# Patient Record
Sex: Female | Born: 1980 | Race: White | Hispanic: No | Marital: Married | Smoking: Never smoker
Health system: Southern US, Community
[De-identification: ages and names within clinical notes are randomized; demographics above are authoritative.]

## PROBLEM LIST (undated history)

## (undated) DIAGNOSIS — G90A Postural orthostatic tachycardia syndrome (POTS): Secondary | ICD-10-CM

## (undated) DIAGNOSIS — D509 Iron deficiency anemia, unspecified: Secondary | ICD-10-CM

## (undated) DIAGNOSIS — D62 Acute posthemorrhagic anemia: Secondary | ICD-10-CM

## (undated) DIAGNOSIS — K900 Celiac disease: Secondary | ICD-10-CM

## (undated) DIAGNOSIS — A389 Scarlet fever, uncomplicated: Secondary | ICD-10-CM

## (undated) DIAGNOSIS — F988 Other specified behavioral and emotional disorders with onset usually occurring in childhood and adolescence: Secondary | ICD-10-CM

## (undated) DIAGNOSIS — I341 Nonrheumatic mitral (valve) prolapse: Secondary | ICD-10-CM

## (undated) DIAGNOSIS — G4733 Obstructive sleep apnea (adult) (pediatric): Secondary | ICD-10-CM

## (undated) DIAGNOSIS — B977 Papillomavirus as the cause of diseases classified elsewhere: Secondary | ICD-10-CM

## (undated) DIAGNOSIS — O30043 Twin pregnancy, dichorionic/diamniotic, third trimester: Secondary | ICD-10-CM

## (undated) DIAGNOSIS — O4703 False labor before 37 completed weeks of gestation, third trimester: Secondary | ICD-10-CM

## (undated) DIAGNOSIS — J45909 Unspecified asthma, uncomplicated: Secondary | ICD-10-CM

## (undated) DIAGNOSIS — K219 Gastro-esophageal reflux disease without esophagitis: Secondary | ICD-10-CM

## (undated) DIAGNOSIS — G43909 Migraine, unspecified, not intractable, without status migrainosus: Secondary | ICD-10-CM

## (undated) HISTORY — DX: Obstructive sleep apnea (adult) (pediatric): G47.33

## (undated) HISTORY — PX: CRYOABLATION: SHX1415

## (undated) HISTORY — DX: Other specified behavioral and emotional disorders with onset usually occurring in childhood and adolescence: F98.8

## (undated) HISTORY — DX: Postural orthostatic tachycardia syndrome (POTS): G90.A

## (undated) HISTORY — DX: Migraine, unspecified, not intractable, without status migrainosus: G43.909

## (undated) HISTORY — DX: Nonrheumatic mitral (valve) prolapse: I34.1

## (undated) HISTORY — PX: COLONOSCOPY: SHX174

## (undated) HISTORY — PX: WISDOM TOOTH EXTRACTION: SHX21

## (undated) HISTORY — PX: TYMPANOSTOMY TUBE PLACEMENT: SHX32

## (undated) HISTORY — PX: UPPER GASTROINTESTINAL ENDOSCOPY: SHX188

## (undated) HISTORY — PX: OTHER SURGICAL HISTORY: SHX169

---

## 2002-04-23 HISTORY — PX: LAPAROSCOPIC NISSEN FUNDOPLICATION: SHX1932

## 2012-03-21 LAB — OB RESULTS CONSOLE HIV ANTIBODY (ROUTINE TESTING): HIV: NONREACTIVE

## 2012-03-21 LAB — OB RESULTS CONSOLE ABO/RH: RH Type: POSITIVE

## 2012-03-21 LAB — OB RESULTS CONSOLE RUBELLA ANTIBODY, IGM: Rubella: IMMUNE

## 2012-04-23 NOTE — L&D Delivery Note (Signed)
Operative Delivery Note At 9:09 PM a viable and healthy female was delivered via Vaginal, Vacuum Investment banker, operational).  Presentation: vertex; Position: Left,, Occiput,, Anterior; Station: +3.  Verbal consent: obtained from patient.  Risks and benefits discussed in detail.  Risks include, but are not limited to the risks of anesthesia, bleeding, infection, damage to maternal tissues, fetal cephalhematoma.  There is also the risk of inability to effect vaginal delivery of the head, or shoulder dystocia that cannot be resolved by established maneuvers, leading to the need for emergency cesarean section.  APGAR: 8, 9; weight .   Placenta status: Intact, Spontaneous.   Cord: 3 vessels with the following complications: None.  Cord pH: na  Anesthesia: Epidural  Instruments: Kiwi x 2 pulls and no pop offs Episiotomy: Median Lacerations: None Suture Repair: 2.0 3.0 vicryl rapide Est. Blood Loss (mL): 200  Mom to postpartum.  Baby to nursery-stable.  Anissa Abbs J 10/02/2012, 10:12 PM

## 2012-10-02 ENCOUNTER — Inpatient Hospital Stay (HOSPITAL_COMMUNITY)
Admission: AD | Admit: 2012-10-02 | Discharge: 2012-10-04 | DRG: 775 | Disposition: A | Discharge status: Home / Self Care | Payer: Managed Care, Other (non HMO) | Source: Ambulatory Visit | Attending: Obstetrics and Gynecology | Admitting: Obstetrics and Gynecology

## 2012-10-02 ENCOUNTER — Encounter (HOSPITAL_COMMUNITY): Payer: Self-pay | Admitting: Anesthesiology

## 2012-10-02 ENCOUNTER — Inpatient Hospital Stay (HOSPITAL_COMMUNITY): Payer: Managed Care, Other (non HMO) | Admitting: Anesthesiology

## 2012-10-02 ENCOUNTER — Encounter (HOSPITAL_COMMUNITY): Payer: Self-pay | Admitting: *Deleted

## 2012-10-02 DIAGNOSIS — D62 Acute posthemorrhagic anemia: Secondary | ICD-10-CM | POA: Diagnosis not present

## 2012-10-02 DIAGNOSIS — O3660X0 Maternal care for excessive fetal growth, unspecified trimester, not applicable or unspecified: Secondary | ICD-10-CM | POA: Diagnosis present

## 2012-10-02 DIAGNOSIS — O9903 Anemia complicating the puerperium: Secondary | ICD-10-CM | POA: Diagnosis not present

## 2012-10-02 HISTORY — DX: Unspecified asthma, uncomplicated: J45.909

## 2012-10-02 HISTORY — DX: Celiac disease: K90.0

## 2012-10-02 HISTORY — DX: Gastro-esophageal reflux disease without esophagitis: K21.9

## 2012-10-02 HISTORY — DX: Scarlet fever, uncomplicated: A38.9

## 2012-10-02 HISTORY — DX: Papillomavirus as the cause of diseases classified elsewhere: B97.7

## 2012-10-02 LAB — CBC
MCH: 32.7 pg (ref 26.0–34.0)
MCHC: 33.6 g/dL (ref 30.0–36.0)
Platelets: 223 10*3/uL (ref 150–400)
RDW: 13.4 % (ref 11.5–15.5)

## 2012-10-02 LAB — RPR: RPR Ser Ql: NONREACTIVE

## 2012-10-02 MED ORDER — LACTATED RINGERS IV SOLN
500.0000 mL | Freq: Once | INTRAVENOUS | Status: AC
  Administered 2012-10-02: 1000 mL via INTRAVENOUS

## 2012-10-02 MED ORDER — LIDOCAINE HCL (PF) 1 % IJ SOLN
INTRAMUSCULAR | Status: DC | PRN
  Administered 2012-10-02 (×2): 5 mL

## 2012-10-02 MED ORDER — IBUPROFEN 600 MG PO TABS: 600.0000 mg | ORAL_TABLET | Freq: Four times a day (QID) | ORAL | Status: DC | PRN

## 2012-10-02 MED ORDER — PHENYLEPHRINE 40 MCG/ML (10ML) SYRINGE FOR IV PUSH (FOR BLOOD PRESSURE SUPPORT)
80.0000 ug | PREFILLED_SYRINGE | INTRAVENOUS | Status: DC | PRN
  Administered 2012-10-02: 80 ug via INTRAVENOUS
  Filled 2012-10-02: qty 2

## 2012-10-02 MED ORDER — METHYLERGONOVINE MALEATE 0.2 MG/ML IJ SOLN
0.2000 mg | INTRAMUSCULAR | Status: DC | PRN
Start: 2012-10-02 — End: 2012-10-03

## 2012-10-02 MED ORDER — ACETAMINOPHEN 325 MG PO TABS: 650.0000 mg | ORAL_TABLET | ORAL | Status: DC | PRN

## 2012-10-02 MED ORDER — ONDANSETRON HCL 4 MG/2ML IJ SOLN: 4.0000 mg | INTRAMUSCULAR | Status: DC | PRN

## 2012-10-02 MED ORDER — FENTANYL 2.5 MCG/ML BUPIVACAINE 1/10 % EPIDURAL INFUSION (WH - ANES)
14.0000 mL/h | INTRAMUSCULAR | Status: DC | PRN
  Administered 2012-10-02 (×2): 14 mL/h via EPIDURAL
  Filled 2012-10-02 (×2): qty 125

## 2012-10-02 MED ORDER — ZOLPIDEM TARTRATE 5 MG PO TABS: 5.0000 mg | ORAL_TABLET | Freq: Every evening | ORAL | Status: DC | PRN

## 2012-10-02 MED ORDER — DIPHENHYDRAMINE HCL 50 MG/ML IJ SOLN: 12.5000 mg | INTRAMUSCULAR | Status: DC | PRN

## 2012-10-02 MED ORDER — ONDANSETRON HCL 4 MG/2ML IJ SOLN: 4.0000 mg | Freq: Four times a day (QID) | INTRAMUSCULAR | Status: DC | PRN

## 2012-10-02 MED ORDER — PRENATAL MULTIVITAMIN CH
1.0000 | ORAL_TABLET | Freq: Every day | ORAL | Status: DC
  Administered 2012-10-03 – 2012-10-04 (×2): 1 via ORAL
  Filled 2012-10-02 (×2): qty 1

## 2012-10-02 MED ORDER — SENNOSIDES-DOCUSATE SODIUM 8.6-50 MG PO TABS
2.0000 | ORAL_TABLET | Freq: Every day | ORAL | Status: DC
  Administered 2012-10-03: 2 via ORAL

## 2012-10-02 MED ORDER — DIBUCAINE 1 % RE OINT: 1.0000 "application " | TOPICAL_OINTMENT | RECTAL | Status: DC | PRN

## 2012-10-02 MED ORDER — EPHEDRINE 5 MG/ML INJ
10.0000 mg | INTRAVENOUS | Status: DC | PRN
  Filled 2012-10-02: qty 2

## 2012-10-02 MED ORDER — FLEET ENEMA 7-19 GM/118ML RE ENEM: 1.0000 | ENEMA | RECTAL | Status: DC | PRN

## 2012-10-02 MED ORDER — LACTATED RINGERS IV SOLN: 500.0000 mL | INTRAVENOUS | Status: DC | PRN

## 2012-10-02 MED ORDER — SIMETHICONE 80 MG PO CHEW: 80.0000 mg | CHEWABLE_TABLET | ORAL | Status: DC | PRN

## 2012-10-02 MED ORDER — TERBUTALINE SULFATE 1 MG/ML IJ SOLN: 0.2500 mg | Freq: Once | INTRAMUSCULAR | Status: DC | PRN

## 2012-10-02 MED ORDER — METHYLERGONOVINE MALEATE 0.2 MG PO TABS: 0.2000 mg | ORAL_TABLET | ORAL | Status: DC | PRN

## 2012-10-02 MED ORDER — OXYTOCIN 40 UNITS IN LACTATED RINGERS INFUSION - SIMPLE MED
62.5000 mL/h | INTRAVENOUS | Status: DC
  Filled 2012-10-02: qty 1000

## 2012-10-02 MED ORDER — OXYCODONE-ACETAMINOPHEN 5-325 MG PO TABS: 1.0000 | ORAL_TABLET | ORAL | Status: DC | PRN

## 2012-10-02 MED ORDER — ONDANSETRON HCL 4 MG PO TABS: 4.0000 mg | ORAL_TABLET | ORAL | Status: DC | PRN

## 2012-10-02 MED ORDER — CITRIC ACID-SODIUM CITRATE 334-500 MG/5ML PO SOLN: 30.0000 mL | ORAL | Status: DC | PRN

## 2012-10-02 MED ORDER — DIPHENHYDRAMINE HCL 25 MG PO CAPS: 25.0000 mg | ORAL_CAPSULE | Freq: Four times a day (QID) | ORAL | Status: DC | PRN

## 2012-10-02 MED ORDER — LACTATED RINGERS IV SOLN
INTRAVENOUS | Status: DC
  Administered 2012-10-02 (×3): via INTRAVENOUS

## 2012-10-02 MED ORDER — OXYTOCIN BOLUS FROM INFUSION: 500.0000 mL | INTRAVENOUS | Status: DC

## 2012-10-02 MED ORDER — WITCH HAZEL-GLYCERIN EX PADS
1.0000 "application " | MEDICATED_PAD | CUTANEOUS | Status: DC | PRN
  Administered 2012-10-03 (×2): 1 via TOPICAL

## 2012-10-02 MED ORDER — IBUPROFEN 600 MG PO TABS
600.0000 mg | ORAL_TABLET | Freq: Four times a day (QID) | ORAL | Status: DC
  Administered 2012-10-03 – 2012-10-04 (×7): 600 mg via ORAL
  Filled 2012-10-02 (×7): qty 1

## 2012-10-02 MED ORDER — OXYTOCIN 40 UNITS IN LACTATED RINGERS INFUSION - SIMPLE MED
1.0000 m[IU]/min | INTRAVENOUS | Status: DC
  Administered 2012-10-02: 2 m[IU]/min via INTRAVENOUS

## 2012-10-02 MED ORDER — TETANUS-DIPHTH-ACELL PERTUSSIS 5-2.5-18.5 LF-MCG/0.5 IM SUSP: 0.5000 mL | Freq: Once | INTRAMUSCULAR | Status: DC

## 2012-10-02 MED ORDER — BENZOCAINE-MENTHOL 20-0.5 % EX AERO
1.0000 "application " | INHALATION_SPRAY | CUTANEOUS | Status: DC | PRN
  Administered 2012-10-03 – 2012-10-04 (×4): 1 via TOPICAL
  Filled 2012-10-02 (×4): qty 56

## 2012-10-02 MED ORDER — LIDOCAINE HCL (PF) 1 % IJ SOLN
30.0000 mL | INTRAMUSCULAR | Status: DC | PRN
  Filled 2012-10-02 (×2): qty 30

## 2012-10-02 MED ORDER — LANOLIN HYDROUS EX OINT: TOPICAL_OINTMENT | CUTANEOUS | Status: DC | PRN

## 2012-10-02 MED ORDER — EPHEDRINE 5 MG/ML INJ
10.0000 mg | INTRAVENOUS | Status: DC | PRN
  Filled 2012-10-02: qty 4
  Filled 2012-10-02: qty 2

## 2012-10-02 MED ORDER — PHENYLEPHRINE 40 MCG/ML (10ML) SYRINGE FOR IV PUSH (FOR BLOOD PRESSURE SUPPORT)
80.0000 ug | PREFILLED_SYRINGE | INTRAVENOUS | Status: DC | PRN
  Filled 2012-10-02: qty 2
  Filled 2012-10-02: qty 5

## 2012-10-02 MED ORDER — BUTORPHANOL TARTRATE 1 MG/ML IJ SOLN: 1.0000 mg | INTRAMUSCULAR | Status: DC | PRN

## 2012-10-02 NOTE — Progress Notes (Signed)
SVE performed on pt at 0630 and md notified of results. Md requests to start pitocin at this time.  Pt informed of this and of infection risks if labor does not begin within a certain time of ruptured membranes.  Pt verifies that she understand this but still does not want to start pitocin.  Says that she wants to walk for and then reassess.  Dr Billy Coast says to report to patient that he does not agree with this plan but if pt insists on walking, she can be taken off fhr monitors for the .  Md wants pt started on pitocin immediately after pt returns from walking.  Patient informed of this, took off monitors, and sent to walk.

## 2012-10-02 NOTE — MAU Note (Signed)
Pt to room 170 for eval of ROM

## 2012-10-02 NOTE — Progress Notes (Signed)
Cheryl Lara is a 32 y.o. G1P0 at [redacted]w[redacted]d by LMP admitted for active labor, rupture of membranes  Subjective: Occ pressure  Objective: BP 122/71  Pulse 99  Temp(Src) 99.2 F (37.3 C) (Oral)  Resp 18  Ht 5\' 9"  (1.753 m)  Wt 112.038 kg (247 lb)  BMI 36.46 kg/m2  SpO2 97%      FHT:  FHR: 155 bpm, variability: moderate,  accelerations:  Present,  decelerations:  Absent UC:   regular, every 2 minutes SVE:   FD/100/+1 LOT-LOA Labs: Lab Results  Component Value Date   WBC 13.3* 10/02/2012   HGB 11.6* 10/02/2012   HCT 34.5* 10/02/2012   MCV 97.2 10/02/2012   PLT 223 10/02/2012    Assessment / Plan: Augmentation of labor, progressing well Likely LGA  Labor: Progressing normally Preeclampsia:  labs stable Fetal Wellbeing:  Category I Pain Control:  Epidural I/D:  n/a Anticipated MOD:  NSVD  Sache Sane J 10/02/2012, 6:07 PM

## 2012-10-02 NOTE — Progress Notes (Signed)
Cheryl Lara is a 32 y.o. G1P0 at [redacted]w[redacted]d by LMP admitted for rupture of membranes  Subjective: Mild contractions  Objective: BP 143/66  Pulse 89  Temp(Src) 98.7 F (37.1 C) (Oral)  Resp 20  Ht 5\' 9"  (1.753 m)  Wt 112.038 kg (247 lb)  BMI 36.46 kg/m2  SpO2 100%      FHT:  FHR: 125 bpm, variability: moderate,  accelerations:  Present,  decelerations:  Absent UC:   irregular, every 8 minutes SVE:   Dilation: 1.5 Effacement (%): 70 Station: -2 Exam by:: e.foley,rn  Labs: Lab Results  Component Value Date   WBC 13.3* 10/02/2012   HGB 11.6* 10/02/2012   HCT 34.5* 10/02/2012   MCV 97.2 10/02/2012   PLT 223 10/02/2012    Assessment / Plan: Protracted latent phase  Labor: pitocin Preeclampsia:  labs stable Fetal Wellbeing:  Category I Pain Control:  Labor support without medications I/D:  n/a Anticipated MOD:  NSVD  Cheryl Lara J 10/02/2012, 6:33 AM

## 2012-10-02 NOTE — Progress Notes (Signed)
BOBIE CARIS is a 32 y.o. G1P0 at [redacted]w[redacted]d by LMP admitted for rupture of membranes  Subjective: Inc discomfort  Objective: BP 138/64  Pulse 85  Temp(Src) 98.2 F (36.8 C) (Oral)  Resp 20  Ht 5\' 9"  (1.753 m)  Wt 112.038 kg (247 lb)  BMI 36.46 kg/m2  SpO2 100%      FHT:  FHR: 135 bpm, variability: moderate,  accelerations:  Present,  decelerations:  Absent UC:   irregular, every 4 minutes SVE:   Dilation: 4 Effacement (%): 90 Station: -2 Exam by:: Dr. Billy Coast  Labs: Lab Results  Component Value Date   WBC 13.3* 10/02/2012   HGB 11.6* 10/02/2012   HCT 34.5* 10/02/2012   MCV 97.2 10/02/2012   PLT 223 10/02/2012    Assessment / Plan: Augmentation of labor, progressing well  Labor: Progressing normally Preeclampsia:  labs stable Fetal Wellbeing:  Category I Pain Control:  Labor support without medications I/D:  n/a Anticipated MOD:  NSVD  Jasim Harari J 10/02/2012, 8:35 AM

## 2012-10-02 NOTE — Anesthesia Procedure Notes (Signed)
Epidural Patient location during procedure: OB Start time: 10/02/2012 9:41 AM  Staffing Anesthesiologist: Angus Seller., Harrell Gave. Performed by: anesthesiologist   Preanesthetic Checklist Completed: patient identified, site marked, surgical consent, pre-op evaluation, timeout performed, IV checked, risks and benefits discussed and monitors and equipment checked  Epidural Patient position: sitting Prep: site prepped and draped and DuraPrep Patient monitoring: continuous pulse ox and blood pressure Approach: midline Injection technique: LOR air and LOR saline  Needle:  Needle type: Tuohy  Needle gauge: 17 G Needle length: 9 cm and 9 Needle insertion depth: 6 cm Catheter type: closed end flexible Catheter size: 19 Gauge Catheter at skin depth: 12 cm Test dose: negative  Assessment Events: blood not aspirated, injection not painful, no injection resistance, negative IV test and no paresthesia  Additional Notes Patient identified.  Risk benefits discussed including failed block, incomplete pain control, headache, nerve damage, paralysis, blood pressure changes, nausea, vomiting, reactions to medication both toxic or allergic, and postpartum back pain.  Patient expressed understanding and wished to proceed.  All questions were answered.  Sterile technique used throughout procedure and epidural site dressed with sterile barrier dressing. No paresthesia or other complications noted.The patient did not experience any signs of intravascular injection such as tinnitus or metallic taste in mouth nor signs of intrathecal spread such as rapid motor block. Please see nursing notes for vital signs.

## 2012-10-02 NOTE — H&P (Signed)
Cheryl Lara, Cheryl Lara NO.:  [id number]  MEDICAL RECORD NO.:  [id number]  LOCATION:  9170                          FACILITY:  [facility]  PHYSICIAN:  Lenoard Aden, M.D.DATE OF BIRTH:  05-Mar-1981  DATE OF ADMISSION:  10/02/2012 DATE OF DISCHARGE:                             HISTORY & PHYSICAL   CHIEF COMPLAINT:  Spontaneous ruptured membranes at approximately 10:30, on June 11,2014.  HISTORY OF PRESENT ILLNESS:  She is a 32 year old white female, G1, P0, at 73 and 5/7th weeks gestation with spontaneous rupture of membranes and favorable cervix at this time.  PAST MEDICAL HISTORY:  Remarkable for asthma, scarlet fever, and cryosurgery for an abnormal Pap smear in 2008.  She is a nonsmoker, nondrinker.  She denies domestic or physical violence.  Her medical history is remarkable for gastroesophageal reflux.  MEDICATIONS:  Prenatal vitamins.  FAMILY HISTORY:  She has a family history of heart disease, hypertension, lymphoma, and breast cancer.  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  PRENATAL COURSE:  Complicated by excessive maternal weight gain and presumed delayed for gestational age fetus.  PHYSICAL EXAMINATION:  GENERAL:  She is a well-developed, well- nourished, white female, in no acute distress. HEENT:  Normal. NECK:  Supple.  Full range of motion. LUNGS:  Clear. HEART:  Regular rate and rhythm. ABDOMEN:  Soft, gravid, nontender.  Estimated fetal weight 8-1/2 to 9 pounds. Cervix 3 to 4 cm, 90%, vertex, -1. EXTREMITIES:  There are no cords. NEUROLOGIC:  Nonfocal. SKIN:  Intact.  IMPRESSION:  Term intrauterine pregnancy with spontaneous rupture of membranes.  PLAN:  Admit, Pitocin augmentation, epidural as needed.  Anticipate attempts at vaginal delivery.  Monitor signs and symptoms of infection.  __________     Lenoard Aden, M.D.     RJT/MEDQ  D:  10/02/2012  T:  10/02/2012  Job:  [id number]

## 2012-10-02 NOTE — Anesthesia Preprocedure Evaluation (Signed)
Anesthesia Evaluation  Patient identified by MRN, date of birth, ID band Patient awake    Reviewed: Allergy & Precautions, H&P , Patient's Chart, lab work & pertinent test results  Airway Mallampati: II TM Distance: >3 FB Neck ROM: full    Dental no notable dental hx.    Pulmonary neg pulmonary ROS, asthma ,  breath sounds clear to auscultation  Pulmonary exam normal       Cardiovascular negative cardio ROS  Rhythm:regular Rate:Normal     Neuro/Psych negative neurological ROS  negative psych ROS   GI/Hepatic negative GI ROS, Neg liver ROS, GERD-  ,  Endo/Other  negative endocrine ROS  Renal/GU negative Renal ROS     Musculoskeletal   Abdominal   Peds  Hematology negative hematology ROS (+)   Anesthesia Other Findings Asthma   exercise induced GERD (gastroesophageal reflux disease)        HPV (human papilloma virus) infection     Scarlet fever   hx of    Celiac disease    Reproductive/Obstetrics (+) Pregnancy                           Anesthesia Physical Anesthesia Plan  ASA: II  Anesthesia Plan: Epidural   Post-op Pain Management:    Induction:   Airway Management Planned:   Additional Equipment:   Intra-op Plan:   Post-operative Plan:   Informed Consent: I have reviewed the patients History and Physical, chart, labs and discussed the procedure including the risks, benefits and alternatives for the proposed anesthesia with the patient or authorized representative who has indicated his/her understanding and acceptance.     Plan Discussed with:   Anesthesia Plan Comments:         Anesthesia Quick Evaluation

## 2012-10-02 NOTE — MAU Note (Signed)
Pt reports ROM at 2227.contractions

## 2012-10-02 NOTE — Progress Notes (Signed)
KIMESHA CLAXTON is a 32 y.o. G1P0 at [redacted]w[redacted]d by LMP admitted for active labor, rupture of membranes  Subjective:   Objective: BP 108/67  Pulse 81  Temp(Src) 98.9 F (37.2 C) (Oral)  Resp 20  Ht 5\' 9"  (1.753 m)  Wt 112.038 kg (247 lb)  BMI 36.46 kg/m2  SpO2 98%      FHT:  FHR: 155 bpm, variability: moderate,  accelerations:  Present,  decelerations:  Absent UC:   regular, every 2 minutes SVE:   8/100/0 IUPC placed without difficulty  Labs: Lab Results  Component Value Date   WBC 13.3* 10/02/2012   HGB 11.6* 10/02/2012   HCT 34.5* 10/02/2012   MCV 97.2 10/02/2012   PLT 223 10/02/2012    Assessment / Plan: Augmentation of labor s/p SROM Likely LGA NO s/s chorio  Labor: Progressing normally Preeclampsia:  labs stable Fetal Wellbeing:  Category I Pain Control:  Epidural I/D:  n/a Anticipated MOD:  NSVD  Meerab Maselli J 10/02/2012, 1:38 PM

## 2012-10-03 ENCOUNTER — Encounter (HOSPITAL_COMMUNITY): Payer: Self-pay | Admitting: *Deleted

## 2012-10-03 LAB — CBC
Hemoglobin: 9.4 g/dL — ABNORMAL LOW (ref 12.0–15.0)
MCH: 32.1 pg (ref 26.0–34.0)
MCV: 95.9 fL (ref 78.0–100.0)
RBC: 2.93 MIL/uL — ABNORMAL LOW (ref 3.87–5.11)
WBC: 16.8 10*3/uL — ABNORMAL HIGH (ref 4.0–10.5)

## 2012-10-03 MED ORDER — PNEUMOCOCCAL VAC POLYVALENT 25 MCG/0.5ML IJ INJ
0.5000 mL | INJECTION | INTRAMUSCULAR | Status: AC
  Administered 2012-10-04: 0.5 mL via INTRAMUSCULAR
  Filled 2012-10-03: qty 0.5

## 2012-10-03 NOTE — Anesthesia Postprocedure Evaluation (Signed)
  Anesthesia Post-op Note  Patient: Cheryl Lara  Procedure(s) Performed: * No procedures listed *  Patient Location: Mother/Baby  Anesthesia Type:Epidural  Level of Consciousness: awake  Airway and Oxygen Therapy: Patient Spontanous Breathing  Post-op Pain: none  Post-op Assessment: Patient's Cardiovascular Status Stable, Respiratory Function Stable, Patent Airway, No signs of Nausea or vomiting, Adequate PO intake, Pain level controlled, No headache, No backache, No residual numbness and No residual motor weakness  Post-op Vital Signs: Reviewed and stable  Complications: No apparent anesthesia complications

## 2012-10-03 NOTE — Progress Notes (Signed)
PPD 1 SVD  S:  Reports feeling tired and sore             Tolerating po/ No nausea or vomiting             Bleeding is light             Pain controlled with motrin and percocet             Up ad lib / ambulatory / voiding QS  Newborn breast feeding  / Circumcision planned O:               VS: BP 125/73  Pulse 103  Temp(Src) 98.4 F (36.9 C) (Oral)  Resp 18  Ht 5\' 9"  (1.753 m)  Wt 112.038 kg (247 lb)  BMI 36.46 kg/m2  SpO2 97%   LABS:  Recent Labs  10/02/12 0100 10/03/12 0643  WBC 13.3* 16.8*  HGB 11.6* 9.4*  PLT 223 159                                         I&O: Intake/Output     06/12 0701 - 06/13 0700 06/13 0701 - 06/14 0700   Urine (mL/kg/hr) 850 (0.3)    Blood 200 (0.1)    Total Output 1050     Net -1050          Urine Occurrence 1 x                  Physical Exam:             Alert and oriented X3  Lungs: Clear and unlabored  Heart: regular rate and rhythm / no mumurs  Abdomen: soft, non-tender, non-distended              Fundus: firm, non-tender, Ueven  Perineum: no edema  Lochia: light  Extremities: trace edema, no calf pain or tenderness    A: PPD # 1   Doing well - stable status  P:  Routine post partum orders    Marlinda Mike CNM, MSN, [facility] 10/03/2012, 9:36 AM

## 2012-10-04 DIAGNOSIS — D62 Acute posthemorrhagic anemia: Secondary | ICD-10-CM

## 2012-10-04 HISTORY — DX: Acute posthemorrhagic anemia: D62

## 2012-10-04 MED ORDER — OXYCODONE-ACETAMINOPHEN 5-325 MG PO TABS: 1.0000 | ORAL_TABLET | ORAL | Status: DC | PRN

## 2012-10-04 MED ORDER — IBUPROFEN 600 MG PO TABS: 600.0000 mg | ORAL_TABLET | Freq: Four times a day (QID) | ORAL | Status: DC

## 2012-10-04 NOTE — Discharge Summary (Signed)
Obstetric Discharge Summary  Reason for Admission: onset of labor Prenatal Procedures: none Intrapartum Procedures: vacuum and episiotomy median Postpartum Procedures: none Complications-Operative and Postpartum: 2nd degree median episiotomy without extenstion Hemoglobin  Date Value Range Status  10/03/2012 9.4* 12.0 - 15.0 g/dL Final     DELTA CHECK NOTED     REPEATED TO VERIFY     HCT  Date Value Range Status  10/03/2012 28.1* 36.0 - 46.0 % Final    Physical Exam:  General: alert, cooperative, fatigued and no distress Lochia: appropriate Uterine Fundus: firm Incision: healing well DVT Evaluation: No evidence of DVT seen on physical exam.  Discharge Diagnoses: Term Pregnancy-delivered and Mild ABL anemia / VAC assisted delivery with median episiotomy  Discharge Information: Date: 10/04/2012 Activity: pelvic rest Diet: routine Medications: PNV, Ibuprofen, Colace, Iron and Percocet (OTC slo-Fe / MOM if any constipation) Condition: stable Instructions: refer to practice specific booklet Discharge to: home Follow-up Information   Follow up with Lenoard Aden, MD. Schedule an appointment as soon as possible for a visit in 6 weeks.   Contact information:   [address] [place] [address] [phone]       Newborn Data: Live born female  Birth Weight: 8 lb 9.9 oz (3910 g) APGAR: 8, 9  Home with mother.  Marlinda Mike 10/04/2012, 9:20 AM

## 2012-10-04 NOTE — Progress Notes (Signed)
PPD 2 SVD  S:  Reports feeling well - better today             Tolerating po/ No nausea or vomiting             Bleeding is light             Pain controlled with motrin and percocet             Up ad lib / ambulatory / voiding QS  Newborn breast feeding  / Circumcision done O:               VS: BP 110/73  Pulse 80  Temp(Src) 98.1 F (36.7 C) (Oral)  Resp 18  Ht 5\' 9"  (1.753 m)  Wt 112.038 kg (247 lb)  BMI 36.46 kg/m2  SpO2 98%   LABS:  Recent Labs  10/02/12 0100 10/03/12 0643  WBC 13.3* 16.8*  HGB 11.6* 9.4*  PLT 223 159                  Physical Exam:             Alert and oriented X3  Lungs: Clear and unlabored  Heart: regular rate and rhythm / no mumurs  Abdomen: soft, non-tender, non-distended              Fundus: firm, non-tender, U-1  Perineum: decreased edema  Lochia: light  Extremities: trace edema, no calf pain or tenderness    A: PPD # 2   Doing well - stable status  P:  Routine post partum orders  DC home - WOB booklet / instructions reviewed  Marlinda Mike CNM, MSN, [facility] 10/04/2012, 9:17 AM

## 2014-01-03 ENCOUNTER — Emergency Department (HOSPITAL_COMMUNITY): Payer: Managed Care, Other (non HMO)

## 2014-01-03 ENCOUNTER — Encounter (HOSPITAL_COMMUNITY): Payer: Self-pay | Admitting: Emergency Medicine

## 2014-01-03 ENCOUNTER — Inpatient Hospital Stay (HOSPITAL_COMMUNITY)
Admission: EM | Admit: 2014-01-03 | Discharge: 2014-01-06 | DRG: 390 | Disposition: A | Discharge status: Home / Self Care | Payer: Managed Care, Other (non HMO) | Attending: Internal Medicine | Admitting: Internal Medicine

## 2014-01-03 DIAGNOSIS — Z79899 Other long term (current) drug therapy: Secondary | ICD-10-CM | POA: Diagnosis not present

## 2014-01-03 DIAGNOSIS — D649 Anemia, unspecified: Secondary | ICD-10-CM

## 2014-01-03 DIAGNOSIS — J45909 Unspecified asthma, uncomplicated: Secondary | ICD-10-CM | POA: Diagnosis present

## 2014-01-03 DIAGNOSIS — K565 Intestinal adhesions [bands], unspecified as to partial versus complete obstruction: Principal | ICD-10-CM | POA: Diagnosis present

## 2014-01-03 DIAGNOSIS — K219 Gastro-esophageal reflux disease without esophagitis: Secondary | ICD-10-CM | POA: Diagnosis present

## 2014-01-03 DIAGNOSIS — K56609 Unspecified intestinal obstruction, unspecified as to partial versus complete obstruction: Secondary | ICD-10-CM | POA: Diagnosis present

## 2014-01-03 DIAGNOSIS — K900 Celiac disease: Secondary | ICD-10-CM | POA: Diagnosis present

## 2014-01-03 DIAGNOSIS — R143 Flatulence: Secondary | ICD-10-CM | POA: Diagnosis not present

## 2014-01-03 DIAGNOSIS — D72829 Elevated white blood cell count, unspecified: Secondary | ICD-10-CM | POA: Diagnosis present

## 2014-01-03 DIAGNOSIS — K56600 Partial intestinal obstruction, unspecified as to cause: Secondary | ICD-10-CM | POA: Diagnosis present

## 2014-01-03 DIAGNOSIS — I498 Other specified cardiac arrhythmias: Secondary | ICD-10-CM | POA: Diagnosis present

## 2014-01-03 DIAGNOSIS — R141 Gas pain: Secondary | ICD-10-CM | POA: Diagnosis present

## 2014-01-03 DIAGNOSIS — R142 Eructation: Secondary | ICD-10-CM | POA: Diagnosis present

## 2014-01-03 LAB — COMPREHENSIVE METABOLIC PANEL
ALBUMIN: 4.6 g/dL (ref 3.5–5.2)
ALT: 17 U/L (ref 0–35)
ANION GAP: 14 (ref 5–15)
AST: 20 U/L (ref 0–37)
Alkaline Phosphatase: 48 U/L (ref 39–117)
BUN: 14 mg/dL (ref 6–23)
CALCIUM: 9.7 mg/dL (ref 8.4–10.5)
CO2: 23 mEq/L (ref 19–32)
CREATININE: 0.69 mg/dL (ref 0.50–1.10)
Chloride: 103 mEq/L (ref 96–112)
GFR calc Af Amer: >90 mL/min (ref >90)
GFR calc non Af Amer: >90 mL/min (ref >90)
Glucose, Bld: 88 mg/dL (ref 70–99)
Potassium: 4.5 mEq/L (ref 3.7–5.3)
Sodium: 140 mEq/L (ref 137–147)
TOTAL PROTEIN: 7.8 g/dL (ref 6.0–8.3)
Total Bilirubin: 1 mg/dL (ref 0.3–1.2)

## 2014-01-03 LAB — CBC WITH DIFFERENTIAL/PLATELET
BASOS ABS: 0 10*3/uL (ref 0.0–0.1)
BASOS PCT: 0 % (ref 0–1)
EOS ABS: 0.1 10*3/uL (ref 0.0–0.7)
Eosinophils Relative: 1 % (ref 0–5)
HEMATOCRIT: 41 % (ref 36.0–46.0)
HEMOGLOBIN: 13.9 g/dL (ref 12.0–15.0)
Lymphocytes Relative: 16 % (ref 12–46)
Lymphs Abs: 1.8 10*3/uL (ref 0.7–4.0)
MCH: 32.7 pg (ref 26.0–34.0)
MCHC: 33.9 g/dL (ref 30.0–36.0)
MCV: 96.5 fL (ref 78.0–100.0)
MONO ABS: 0.9 10*3/uL (ref 0.1–1.0)
MONOS PCT: 8 % (ref 3–12)
Neutro Abs: 8.5 10*3/uL — ABNORMAL HIGH (ref 1.7–7.7)
Neutrophils Relative %: 75 % (ref 43–77)
Platelets: 246 10*3/uL (ref 150–400)
RBC: 4.25 MIL/uL (ref 3.87–5.11)
RDW: 12.9 % (ref 11.5–15.5)
WBC: 11.3 10*3/uL — ABNORMAL HIGH (ref 4.0–10.5)

## 2014-01-03 LAB — URINALYSIS, ROUTINE W REFLEX MICROSCOPIC
BILIRUBIN URINE: NEGATIVE
Glucose, UA: NEGATIVE mg/dL
Hgb urine dipstick: NEGATIVE
Ketones, ur: 15 mg/dL — AB
LEUKOCYTES UA: NEGATIVE
NITRITE: NEGATIVE
Protein, ur: NEGATIVE mg/dL
SPECIFIC GRAVITY, URINE: 1.009 (ref 1.005–1.030)
UROBILINOGEN UA: 0.2 mg/dL (ref 0.0–1.0)
pH: 7 (ref 5.0–8.0)

## 2014-01-03 LAB — PREGNANCY, URINE: PREG TEST UR: NEGATIVE

## 2014-01-03 LAB — LIPASE, BLOOD: LIPASE: 38 U/L (ref 11–59)

## 2014-01-03 MED ORDER — ONDANSETRON HCL 4 MG/2ML IJ SOLN
4.0000 mg | Freq: Four times a day (QID) | INTRAMUSCULAR | Status: DC | PRN
  Administered 2014-01-04 (×2): 4 mg via INTRAVENOUS
  Filled 2014-01-03 (×2): qty 2

## 2014-01-03 MED ORDER — ONDANSETRON HCL 4 MG/2ML IJ SOLN
4.0000 mg | Freq: Once | INTRAMUSCULAR | Status: AC
  Administered 2014-01-03: 4 mg via INTRAVENOUS

## 2014-01-03 MED ORDER — PANTOPRAZOLE SODIUM 40 MG IV SOLR
40.0000 mg | INTRAVENOUS | Status: DC
  Administered 2014-01-04 – 2014-01-05 (×2): 40 mg via INTRAVENOUS
  Filled 2014-01-03 (×2): qty 40

## 2014-01-03 MED ORDER — METOCLOPRAMIDE HCL 5 MG/ML IJ SOLN
10.0000 mg | Freq: Once | INTRAMUSCULAR | Status: AC
  Administered 2014-01-03: 10 mg via INTRAVENOUS
  Filled 2014-01-03: qty 2

## 2014-01-03 MED ORDER — AMPHETAMINE-DEXTROAMPHETAMINE 10 MG PO TABS
15.0000 mg | ORAL_TABLET | Freq: Every day | ORAL | Status: DC
  Administered 2014-01-05 – 2014-01-06 (×2): 15 mg via ORAL
  Filled 2014-01-03 (×3): qty 2

## 2014-01-03 MED ORDER — HYDROMORPHONE HCL PF 1 MG/ML IJ SOLN
1.0000 mg | Freq: Once | INTRAMUSCULAR | Status: AC
  Administered 2014-01-03: 1 mg via INTRAVENOUS
  Filled 2014-01-03: qty 1

## 2014-01-03 MED ORDER — HYDROCODONE-ACETAMINOPHEN 5-325 MG PO TABS: 1.0000 | ORAL_TABLET | ORAL | Status: DC | PRN

## 2014-01-03 MED ORDER — HYDROMORPHONE HCL PF 1 MG/ML IJ SOLN
0.5000 mg | INTRAMUSCULAR | Status: DC | PRN
  Administered 2014-01-04 (×4): 0.5 mg via INTRAVENOUS
  Filled 2014-01-03 (×4): qty 1

## 2014-01-03 MED ORDER — PANTOPRAZOLE SODIUM 40 MG IV SOLR
40.0000 mg | Freq: Once | INTRAVENOUS | Status: AC
  Administered 2014-01-03: 40 mg via INTRAVENOUS
  Filled 2014-01-03: qty 40

## 2014-01-03 MED ORDER — SODIUM CHLORIDE 0.9 % IV BOLUS (SEPSIS)
1000.0000 mL | Freq: Once | INTRAVENOUS | Status: AC
  Administered 2014-01-03: 1000 mL via INTRAVENOUS

## 2014-01-03 MED ORDER — ACETAMINOPHEN 325 MG PO TABS: 650.0000 mg | ORAL_TABLET | Freq: Four times a day (QID) | ORAL | Status: DC | PRN

## 2014-01-03 MED ORDER — ONDANSETRON HCL 4 MG/2ML IJ SOLN
4.0000 mg | Freq: Once | INTRAMUSCULAR | Status: DC
  Filled 2014-01-03: qty 2

## 2014-01-03 MED ORDER — ONDANSETRON HCL 4 MG/2ML IJ SOLN
4.0000 mg | Freq: Once | INTRAMUSCULAR | Status: AC
  Administered 2014-01-03: 4 mg via INTRAVENOUS
  Filled 2014-01-03: qty 2

## 2014-01-03 MED ORDER — ONDANSETRON HCL 4 MG PO TABS: 4.0000 mg | ORAL_TABLET | Freq: Four times a day (QID) | ORAL | Status: DC | PRN

## 2014-01-03 MED ORDER — HYDROMORPHONE HCL PF 1 MG/ML IJ SOLN
0.5000 mg | Freq: Once | INTRAMUSCULAR | Status: AC
Start: 2014-01-03 — End: 2014-01-03
  Administered 2014-01-03: 0.5 mg via INTRAVENOUS
  Filled 2014-01-03: qty 1

## 2014-01-03 MED ORDER — IOHEXOL 300 MG/ML  SOLN
100.0000 mL | Freq: Once | INTRAMUSCULAR | Status: AC | PRN
  Administered 2014-01-03: 100 mL via INTRAVENOUS

## 2014-01-03 MED ORDER — SODIUM CHLORIDE 0.9 % IV SOLN
INTRAVENOUS | Status: DC
  Administered 2014-01-03 – 2014-01-05 (×4): via INTRAVENOUS

## 2014-01-03 MED ORDER — GI COCKTAIL ~~LOC~~
30.0000 mL | Freq: Once | ORAL | Status: AC
  Administered 2014-01-03: 30 mL via ORAL
  Filled 2014-01-03: qty 30

## 2014-01-03 MED ORDER — ENOXAPARIN SODIUM 40 MG/0.4ML ~~LOC~~ SOLN
40.0000 mg | Freq: Every day | SUBCUTANEOUS | Status: DC
  Administered 2014-01-03 – 2014-01-05 (×3): 40 mg via SUBCUTANEOUS
  Filled 2014-01-03 (×4): qty 0.4

## 2014-01-03 MED ORDER — IOHEXOL 300 MG/ML  SOLN
50.0000 mL | Freq: Once | INTRAMUSCULAR | Status: AC | PRN
  Administered 2014-01-03: 50 mL via ORAL

## 2014-01-03 MED ORDER — ACETAMINOPHEN 650 MG RE SUPP: 650.0000 mg | Freq: Four times a day (QID) | RECTAL | Status: DC | PRN

## 2014-01-03 MED ORDER — DIPHENHYDRAMINE HCL 50 MG/ML IJ SOLN
25.0000 mg | Freq: Once | INTRAMUSCULAR | Status: AC
  Administered 2014-01-03: 25 mg via INTRAVENOUS
  Filled 2014-01-03: qty 1

## 2014-01-03 NOTE — ED Provider Notes (Signed)
Assumed care of pt from Dr. Ashok Cordia.  Briefly, pt is a 34 y.o. female presenting with recurrent abd pain since last night.  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  On assumption of care awaiting results of [place] RUQ.   6:35 PM this returned as above without focal abnormality, however pt still in pain and states she does not normally have abd pain, with the last episode being many years ago.  Will obtain CT scan due to ho hiatal hernia and nissen.    CT scan demonstrated partial SBO.  Consulted surgery who will follow along. Consulted medicine for admission.   Debby Freiberg, MD 01/03/14 2225

## 2014-01-03 NOTE — ED Notes (Signed)
md at bedside

## 2014-01-03 NOTE — ED Notes (Signed)
Pt reports severe abdominal pain since last night, sts had few drinks yesterday, also reports nausea. LBM this am.

## 2014-01-03 NOTE — ED Notes (Signed)
Pt alert and oriented x4. Respirations even and unlabored, bilateral symmetrical rise and fall of chest. Skin warm and dry. In no acute distress. Denies needs.   

## 2014-01-03 NOTE — ED Notes (Signed)
Pt nauseated 

## 2014-01-03 NOTE — ED Notes (Signed)
md made aware of pts pain level increasing

## 2014-01-03 NOTE — ED Notes (Signed)
US at bedside

## 2014-01-03 NOTE — H&P (Signed)
PCP: [facility]  Chief Complaint:  Abdominal pain  HPI: Cheryl Lara is a 33 y.o. female   has a past medical history of Asthma; GERD (gastroesophageal reflux disease); HPV (human papilloma virus) infection; Scarlet fever; and Celiac disease.   Presented with  Patient developed abdominal pain and distension since this AM, associated with nausea and vomiting, no fever, no chills. She thought it was due to pancreatitis that she had before. Patient have had abdominal surgeries in the past she have had a Hernia repair and Nissen Placation. In ER she had a CT scan done showing partial SBO. Patient states she is improving but still has some pain and nausea.  Last BM was 9/13 am Hospitalist was called for admission for SBO  Review of Systems:    Pertinent positives include:  abdominal pain, nausea, vomiting,  Constitutional:  No weight loss, night sweats, Fevers, chills, fatigue, weight loss  HEENT:  No headaches, Difficulty swallowing,Tooth/dental problems,Sore throat,  No sneezing, itching, ear ache, nasal congestion, post nasal drip,  Cardio-vascular:  No chest pain, Orthopnea, PND, anasarca, dizziness, palpitations.no Bilateral lower extremity swelling  GI:  No heartburn, indigestion, diarrhea, change in bowel habits, loss of appetite, melena, blood in stool, hematemesis Resp:  no shortness of breath at rest. No dyspnea on exertion, No excess mucus, no productive cough, No non-productive cough, No coughing up of blood.No change in color of mucus.No wheezing. Skin:  no rash or lesions. No jaundice GU:  no dysuria, change in color of urine, no urgency or frequency. No straining to urinate.  No flank pain.  Musculoskeletal:  No joint pain or no joint swelling. No decreased range of motion. No back pain.  Psych:  No change in mood or affect. No depression or anxiety. No memory loss.  Neuro: no localizing neurological complaints, no tingling, no weakness, no  double vision, no gait abnormality, no slurred speech, no confusion  Otherwise ROS are negative except for above, 10 systems were reviewed  Past Medical History: Past Medical History  Diagnosis Date  . Asthma     exercise induced  . GERD (gastroesophageal reflux disease)   . HPV (human papilloma virus) infection   . Scarlet fever     hx of  . Celiac disease    Past Surgical History  Procedure Laterality Date  . Laparoscopic nissen fundoplication  8657  . Polyp      removal 2009  . Wisdom tooth extraction    . Tympanostomy tube placement    . Cryoablation       Medications: Prior to Admission medications   Medication Sig Start Date End Date Taking? Authorizing Provider  amphetamine-dextroamphetamine (ADDERALL) 15 MG tablet Take 15 mg by mouth daily.   Yes Historical Provider, MD  ibuprofen (ADVIL,MOTRIN) 200 MG tablet Take 400 mg by mouth every 6 (six) hours as needed.   Yes Historical Provider, MD  Multiple Vitamin (MULTIVITAMIN WITH MINERALS) TABS tablet Take 1 tablet by mouth daily.   Yes Historical Provider, MD    Allergies:   Allergies  Allergen Reactions  . Codeine Nausea And Vomiting  . Other Hives    Cotton seed oil and gluten    Social History:  Ambulatory  Independently  Lives at home  With family     reports that she has never smoked. She does not have any smokeless tobacco history on file. She reports that she does not drink alcohol or use illicit drugs.    Family History: family  history includes CAD in her father and mother; Cancer in her mother; Hypertension in her mother.    Physical Exam: Patient Vitals for the past 24 hrs:  BP Temp Temp src Pulse Resp SpO2  01/03/14 2130 102/41 mmHg - - 56 - 100 %  01/03/14 2007 115/61 mmHg 97.6 F (36.4 C) Oral 60 17 100 %  01/03/14 1730 99/52 mmHg - - 58 - 99 %  01/03/14 1700 108/46 mmHg - - 52 16 97 %  01/03/14 1605 - - - 54 16 100 %  01/03/14 1544 107/63 mmHg - - 67 16 100 %  01/03/14 1437 124/76  mmHg 98 F (36.7 C) - 88 20 100 %  01/03/14 1430 124/76 mmHg - - 69 18 100 %  01/03/14 1342 126/63 mmHg 98.4 F (36.9 C) - 70 18 100 %    1. General:  in No Acute distress 2. Psychological: Alert and  Oriented 3. Head/ENT:     Dry Mucous Membranes                          Head Non traumatic, neck supple                          Normal   Dentition 4. SKIN: normal  Skin turgor,  Skin clean Dry and intact no rash 5. Heart: Regular rate and rhythm no Murmur, Rub or gallop 6. Lungs: Clear to auscultation bilaterally, no wheezes or crackles   7. Abdomen: Soft, tender, slightly distended 8. Lower extremities: no clubbing, cyanosis, or edema 9. Neurologically Grossly intact, moving all 4 extremities equally 10. MSK: Normal range of motion  body mass index is unknown because there is no weight on file.   Labs on Admission:   Recent Labs  01/03/14 1422  NA 140  K 4.5  CL 103  CO2 23  GLUCOSE 88  BUN 14  CREATININE 0.69  CALCIUM 9.7    Recent Labs  01/03/14 1422  AST 20  ALT 17  ALKPHOS 48  BILITOT 1.0  PROT 7.8  ALBUMIN 4.6    Recent Labs  01/03/14 1422  LIPASE 38    Recent Labs  01/03/14 1422  WBC 11.3*  NEUTROABS 8.5*  HGB 13.9  HCT 41.0  MCV 96.5  PLT 246   No results found for this basename: CKTOTAL, CKMB, CKMBINDEX, TROPONINI,  in the last 72 hours No results found for this basename: TSH, T4TOTAL, FREET3, T3FREE, THYROIDAB,  in the last 72 hours No results found for this basename: VITAMINB12, FOLATE, FERRITIN, TIBC, IRON, RETICCTPCT,  in the last 72 hours No results found for this basename: HGBA1C    The CrCl is unknown because both a height and weight (above a minimum accepted value) are required for this calculation. ABG No results found for this basename: phart, pco2, po2, hco3, tco2, acidbasedef, o2sat     No results found for this basename: DDIMER     UA no evidence of UTI  BNP (last 3 results) No results found for this basename:  PROBNP,  in the last 8760 hours  There were no vitals filed for this visit.   Cultures: No results found for this basename: sdes, specrequest, cult, reptstatus     Radiological Exams on Admission: US Abdomen Complete  01/03/2014   CLINICAL DATA:  Pain.  Evaluate for gallstones or cholecystitis.  EXAM: ULTRASOUND ABDOMEN COMPLETE  COMPARISON:  None.  FINDINGS:  Gallbladder:  No gallstones or wall thickening visualized. No sonographic Murphy sign noted.  Common bile duct:  Diameter: 3 mm  Liver:  No focal lesion identified. Within normal limits in parenchymal echogenicity.  IVC:  No abnormality visualized.  Pancreas:  Visualized portion unremarkable.  Spleen:  Size and appearance within normal limits.  Right Kidney:  Length: 11 cm. Echogenicity within normal limits. No mass or hydronephrosis visualized.  Left Kidney:  Length: 10.7 cm. Echogenicity within normal limits. No mass or hydronephrosis visualized.  Abdominal aorta:  No aneurysm visualized.  Other findings:  None.  IMPRESSION: Abdominal ultrasound within normal.   Electronically Signed   By: Curlene Dolphin M.D.   On: 01/03/2014 17:56   Ct Abdomen Pelvis W Contrast  01/03/2014   CLINICAL DATA:  Epigastric pain.  Nausea.  EXAM: CT ABDOMEN AND PELVIS WITH CONTRAST  TECHNIQUE: Multidetector CT imaging of the abdomen and pelvis was performed using the standard protocol following bolus administration of intravenous contrast.  CONTRAST:  108mL OMNIPAQUE IOHEXOL 300 MG/ML SOLN, 172mL OMNIPAQUE IOHEXOL 300 MG/ML SOLN  COMPARISON:  None.  FINDINGS: Musculoskeletal:  No aggressive osseous lesions.  Lung Bases: Mild atelectasis.  Liver:  Normal variant Riedel's lobe of the liver.  Spleen:  Normal.  Gallbladder:  Contracted gallbladder.  Common bile duct:  Normal.  Pancreas:  Normal.  Adrenal glands:  Normal bilaterally.  Kidneys: Small amount of contrast is present in the renal collecting systems bilaterally. Both ureters appear normal. Normal renal enhancement  and excretion.  Stomach:  Distended with oral contrast.  Small bowel: Normal duodenum. Progressive dilation of jejunum leading to a transition point in the LEFT mid abdomen. There are is angulated bowel at the transition point with decompression of bowel distally. The obstruction at the angulation appears partial although there is no distal contrast and fluid fills small bowel loops extending into the pelvis.  Colon: Normal appendix. Prominent stool in the cecum. No inflammatory changes of colon or obstruction.  Pelvic Genitourinary: IUD. Partially collapsed urinary bladder. Small amount of free fluid in the anatomic pelvis which may be physiologic are reactive to obstruction.  Vasculature: Normal.  Body Wall: Normal.  IMPRESSION: Small bowel obstruction with transition point in the LEFT mid abdomen, probably due to adhesive band with angulation of small bowel loop. This appears to be a partial small bowel obstruction.   Electronically Signed   By: Dereck Ligas M.D.   On: 01/03/2014 20:44    Chart has been reviewed  Assessment/Plan  33 yo W prior hx of abdominal surgeries here with  Partial SBO  Present on Admission:  . Partial small bowel obstruction - admit to Med surge, NPO, bowel rest, IVF, reimage with KUB in AM, surgical consult if not improving. No indication for NG tube at this point   Prophylaxis: Lovenox, Protonix  CODE STATUS:  FULL CODE  Other plan as per orders.  I have spent a total of 55 min on this admission  Shoichi Mielke 01/03/2014, 10:09 PM  [facility]  Pager [phone]   If 7AM-7PM, please contact the day team taking care of the patient  [email or web address]  Password [email or web address]

## 2014-01-03 NOTE — ED Provider Notes (Signed)
CSN: [id number]     Arrival date & time 01/03/14  1335 History   First MD Initiated Contact with Patient 01/03/14 1407     Chief Complaint  Patient presents with  . Abdominal Pain     (Consider location/radiation/quality/duration/timing/severity/associated sxs/prior Treatment) Patient is a 33 y.o. female presenting with abdominal pain. The history is provided by the patient.  Abdominal Pain Associated symptoms: no chest pain, no chills, no cough, no diarrhea, no dysuria, no fever, no shortness of breath, no sore throat and no vomiting   pt c/o epigastric pain and nausea in the past day. Pain constant, moderate-severe, radiates to back. No specific exacerbating or alleviating factors. States remote, prior hx pancreatitis x 2, pain is similar. No hx pud or gallstones. No lower abd or pelvic pain. Having normal bms, no diarrhea or constipation. No dysuria or gu c/o. Prior abd surgery includes remote hx nissen fundoplication. No fever or chills. + few etoh beverages last evening prior to onset symptoms.      Past Medical History  Diagnosis Date  . Asthma     exercise induced  . GERD (gastroesophageal reflux disease)   . HPV (human papilloma virus) infection   . Scarlet fever     hx of  . Celiac disease    Past Surgical History  Procedure Laterality Date  . Laparoscopic nissen fundoplication  8676  . Polyp      removal 2009  . Wisdom tooth extraction    . Tympanostomy tube placement    . Cryoablation     No family history on file. History  Substance Use Topics  . Smoking status: Never Smoker   . Smokeless tobacco: Not on file  . Alcohol Use: No   OB History   Grav Para Term Preterm Abortions TAB SAB Ect Mult Living   1 1 1       1      Review of Systems  Constitutional: Negative for fever and chills.  HENT: Negative for sore throat.   Eyes: Negative for redness.  Respiratory: Negative for cough and shortness of breath.   Cardiovascular: Negative for chest pain.   Gastrointestinal: Positive for abdominal pain. Negative for vomiting and diarrhea.  Genitourinary: Negative for dysuria and flank pain.  Musculoskeletal: Negative for back pain and neck pain.  Skin: Negative for rash.  Neurological: Negative for headaches.  Hematological: Does not bruise/bleed easily.  Psychiatric/Behavioral: Negative for confusion.      Allergies  Other  Home Medications   Prior to Admission medications   Medication Sig Start Date End Date Taking? Authorizing Provider  Prenatal Vit-Fe Fumarate-FA (PRENATAL MULTIVITAMIN) TABS Take 1 tablet by mouth daily at 12 noon.    Historical Provider, MD   BP 124/76  Pulse 69  Temp(Src) 98.4 F (36.9 C)  Resp 18  SpO2 100% Physical Exam  Nursing note and vitals reviewed. Constitutional: She appears well-developed and well-nourished. No distress.  HENT:  Mouth/Throat: Oropharynx is clear and moist.  Eyes: Conjunctivae are normal. No scleral icterus.  Neck: Neck supple. No tracheal deviation present.  Cardiovascular: Normal rate, regular rhythm, normal heart sounds and intact distal pulses.   Pulmonary/Chest: Effort normal and breath sounds normal. No respiratory distress.  Abdominal: Soft. Normal appearance. She exhibits no distension and no mass. There is tenderness. There is no rebound and no guarding.  Moderate epigastric/upper abd tenderness.  No incarc hernia.   Genitourinary:  No cva tenderness  Musculoskeletal: She exhibits no edema.  Neurological: She is alert.  Skin: Skin is warm and dry. No rash noted. She is not diaphoretic.  Psychiatric: She has a normal mood and affect.    ED Course  Procedures (including critical care time) Labs Review  Results for orders placed during the hospital encounter of 01/03/14  CBC WITH DIFFERENTIAL      Result Value Ref Range   WBC 11.3 (*) 4.0 - 10.5 K/uL   RBC 4.25  3.87 - 5.11 MIL/uL   Hemoglobin 13.9  12.0 - 15.0 g/dL   HCT 41.0  36.0 - 46.0 %   MCV 96.5  78.0  - 100.0 fL   MCH 32.7  26.0 - 34.0 pg   MCHC 33.9  30.0 - 36.0 g/dL   RDW 12.9  11.5 - 15.5 %   Platelets 246  150 - 400 K/uL   Neutrophils Relative % 75  43 - 77 %   Neutro Abs 8.5 (*) 1.7 - 7.7 K/uL   Lymphocytes Relative 16  12 - 46 %   Lymphs Abs 1.8  0.7 - 4.0 K/uL   Monocytes Relative 8  3 - 12 %   Monocytes Absolute 0.9  0.1 - 1.0 K/uL   Eosinophils Relative 1  0 - 5 %   Eosinophils Absolute 0.1  0.0 - 0.7 K/uL   Basophils Relative 0  0 - 1 %   Basophils Absolute 0.0  0.0 - 0.1 K/uL  COMPREHENSIVE METABOLIC PANEL      Result Value Ref Range   Sodium 140  137 - 147 mEq/L   Potassium 4.5  3.7 - 5.3 mEq/L   Chloride 103  96 - 112 mEq/L   CO2 23  19 - 32 mEq/L   Glucose, Bld 88  70 - 99 mg/dL   BUN 14  6 - 23 mg/dL   Creatinine, Ser 0.69  0.50 - 1.10 mg/dL   Calcium 9.7  8.4 - 10.5 mg/dL   Total Protein 7.8  6.0 - 8.3 g/dL   Albumin 4.6  3.5 - 5.2 g/dL   AST 20  0 - 37 U/L   ALT 17  0 - 35 U/L   Alkaline Phosphatase 48  39 - 117 U/L   Total Bilirubin 1.0  0.3 - 1.2 mg/dL   GFR calc non Af Amer >90  >90 mL/min   GFR calc Af Amer >90  >90 mL/min   Anion gap 14  5 - 15  LIPASE, BLOOD      Result Value Ref Range   Lipase 38  11 - 59 U/L  URINALYSIS, ROUTINE W REFLEX MICROSCOPIC      Result Value Ref Range   Color, Urine YELLOW  YELLOW   APPearance CLEAR  CLEAR   Specific Gravity, Urine 1.009  1.005 - 1.030   pH 7.0  5.0 - 8.0   Glucose, UA NEGATIVE  NEGATIVE mg/dL   Hgb urine dipstick NEGATIVE  NEGATIVE   Bilirubin Urine NEGATIVE  NEGATIVE   Ketones, ur 15 (*) NEGATIVE mg/dL   Protein, ur NEGATIVE  NEGATIVE mg/dL   Urobilinogen, UA 0.2  0.0 - 1.0 mg/dL   Nitrite NEGATIVE  NEGATIVE   Leukocytes, UA NEGATIVE  NEGATIVE      MDM   Iv ns bolus. Dilaudid iv. protonix iv. zofran iv.  Reviewed nursing notes and prior charts for additional history.   Pain improved w meds. No recurrent nv.  Given upper abd pain to back, will get u/s, r/o gallstones.  No mid to  lower abd  or pelvis pain or tenderness on exam.  U/s pending.  Pt continues to feel improved.   1550 u/s pending, signed out to Dr Colin Rhein to check u/s when back, recheck pt, and dispo appropriately.     Mirna Mires, MD 01/03/14 [phone]

## 2014-01-03 NOTE — ED Notes (Signed)
md at bedside  Pt alert and oriented x4. Respirations even and unlabored, bilateral symmetrical rise and fall of chest. Skin warm and dry. In no acute distress. Denies needs.   

## 2014-01-03 NOTE — ED Notes (Signed)
US completed

## 2014-01-04 ENCOUNTER — Inpatient Hospital Stay (HOSPITAL_COMMUNITY): Payer: Managed Care, Other (non HMO)

## 2014-01-04 LAB — COMPREHENSIVE METABOLIC PANEL
ALBUMIN: 3.5 g/dL (ref 3.5–5.2)
ALT: 12 U/L (ref 0–35)
AST: 13 U/L (ref 0–37)
Alkaline Phosphatase: 39 U/L (ref 39–117)
Anion gap: 10 (ref 5–15)
BUN: 13 mg/dL (ref 6–23)
CALCIUM: 8.5 mg/dL (ref 8.4–10.5)
CO2: 22 mEq/L (ref 19–32)
Chloride: 105 mEq/L (ref 96–112)
Creatinine, Ser: 0.8 mg/dL (ref 0.50–1.10)
GFR calc Af Amer: >90 mL/min (ref >90)
GFR calc non Af Amer: >90 mL/min (ref >90)
Glucose, Bld: 79 mg/dL (ref 70–99)
Potassium: 4.3 mEq/L (ref 3.7–5.3)
Sodium: 137 mEq/L (ref 137–147)
TOTAL PROTEIN: 6 g/dL (ref 6.0–8.3)
Total Bilirubin: 1 mg/dL (ref 0.3–1.2)

## 2014-01-04 LAB — CBC
HEMATOCRIT: 34.9 % — AB (ref 36.0–46.0)
HEMOGLOBIN: 11.9 g/dL — AB (ref 12.0–15.0)
MCH: 32.9 pg (ref 26.0–34.0)
MCHC: 34.1 g/dL (ref 30.0–36.0)
MCV: 96.4 fL (ref 78.0–100.0)
Platelets: 192 10*3/uL (ref 150–400)
RBC: 3.62 MIL/uL — ABNORMAL LOW (ref 3.87–5.11)
RDW: 12.8 % (ref 11.5–15.5)
WBC: 10.7 10*3/uL — ABNORMAL HIGH (ref 4.0–10.5)

## 2014-01-04 LAB — MAGNESIUM: MAGNESIUM: 2 mg/dL (ref 1.5–2.5)

## 2014-01-04 LAB — PHOSPHORUS: PHOSPHORUS: 3.8 mg/dL (ref 2.3–4.6)

## 2014-01-04 LAB — TSH: TSH: 1.38 u[IU]/mL (ref 0.350–4.500)

## 2014-01-04 MED ORDER — METOCLOPRAMIDE HCL 5 MG/ML IJ SOLN
10.0000 mg | Freq: Four times a day (QID) | INTRAMUSCULAR | Status: DC | PRN
  Administered 2014-01-04: 10 mg via INTRAVENOUS
  Filled 2014-01-04: qty 2

## 2014-01-04 MED ORDER — ONDANSETRON HCL 4 MG/2ML IJ SOLN: 4.0000 mg | Freq: Four times a day (QID) | INTRAMUSCULAR | Status: DC | PRN

## 2014-01-04 MED ORDER — CETYLPYRIDINIUM CHLORIDE 0.05 % MT LIQD
7.0000 mL | Freq: Two times a day (BID) | OROMUCOSAL | Status: DC
  Administered 2014-01-04 – 2014-01-05 (×2): 7 mL via OROMUCOSAL

## 2014-01-04 MED ORDER — ONDANSETRON HCL 4 MG PO TABS: 4.0000 mg | ORAL_TABLET | Freq: Four times a day (QID) | ORAL | Status: DC | PRN

## 2014-01-04 MED ORDER — CHLORHEXIDINE GLUCONATE 0.12 % MT SOLN
15.0000 mL | Freq: Two times a day (BID) | OROMUCOSAL | Status: DC
  Administered 2014-01-04 – 2014-01-06 (×5): 15 mL via OROMUCOSAL
  Filled 2014-01-04 (×7): qty 15

## 2014-01-04 MED ORDER — INFLUENZA VAC SPLIT QUAD 0.5 ML IM SUSY
0.5000 mL | PREFILLED_SYRINGE | INTRAMUSCULAR | Status: AC
  Administered 2014-01-05: 0.5 mL via INTRAMUSCULAR
  Filled 2014-01-04 (×3): qty 0.5

## 2014-01-04 NOTE — Care Management Note (Unsigned)
    Page 1 of 1   01/04/2014     11:43:30 AM CARE MANAGEMENT NOTE 01/04/2014  Patient:  Cheryl Lara, Cheryl Lara   Account Number:  [id number]  Date Initiated:  01/04/2014  Documentation initiated by:  [facility]  Subjective/Objective Assessment:   33 year old female admitted with SBO.     Action/Plan:   From home.   Anticipated DC Date:  01/07/2014   Anticipated DC Plan:  [place]  CM consult      Choice offered to / List presented to:             Status of service:  In process, will continue to follow Medicare Important Message given?   (If response is "NO", the following Medicare IM given date fields will be blank) Date Medicare IM given:   Medicare IM given by:   Date Additional Medicare IM given:   Additional Medicare IM given by:    Discharge Disposition:    Per UR Regulation:  Reviewed for med. necessity/level of care/duration of stay  If discussed at [place] of Stay Meetings, dates discussed:    Comments:

## 2014-01-04 NOTE — Consult Note (Signed)
Reason for Consult:pSBO Referring Physician: Dr. Beatrice Lecher is an 33 y.o. female.  HPI: A patient is a 33 year old female with abdominal pain. The patient has had a history of a Nissen fundoplication hiatal hernia repair in 2004. She stated this started yesterday. She did have some accompanying nausea and vomiting.  The patient states her last bowel movement was yesterday.She has not passed any gas and she's been here. She does state the pain has gotten better, however she is on pain medication.  Past Medical History  Diagnosis Date  . Asthma     exercise induced  . GERD (gastroesophageal reflux disease)   . HPV (human papilloma virus) infection   . Scarlet fever     hx of  . Celiac disease     Past Surgical History  Procedure Laterality Date  . Laparoscopic nissen fundoplication  2595  . Polyp      removal 2009  . Wisdom tooth extraction    . Tympanostomy tube placement    . Cryoablation      Family History  Problem Relation Age of Onset  . Cancer Mother   . CAD Mother   . Hypertension Mother   . CAD Father     Social History:  reports that she has never smoked. She does not have any smokeless tobacco history on file. She reports that she does not drink alcohol or use illicit drugs.  Allergies:  Allergies  Allergen Reactions  . Codeine Nausea And Vomiting  . Other Hives    Cotton seed oil and gluten    Medications: I have reviewed the patient's current medications.  Results for orders placed during the hospital encounter of 01/03/14 (from the past 48 hour(s))  URINALYSIS, ROUTINE W REFLEX MICROSCOPIC     Status: Abnormal   Collection Time    01/03/14  2:00 PM      Result Value Ref Range   Color, Urine YELLOW  YELLOW   APPearance CLEAR  CLEAR   Specific Gravity, Urine 1.009  1.005 - 1.030   pH 7.0  5.0 - 8.0   Glucose, UA NEGATIVE  NEGATIVE mg/dL   Hgb urine dipstick NEGATIVE  NEGATIVE   Bilirubin Urine NEGATIVE  NEGATIVE   Ketones, ur 15 (*)  NEGATIVE mg/dL   Protein, ur NEGATIVE  NEGATIVE mg/dL   Urobilinogen, UA 0.2  0.0 - 1.0 mg/dL   Nitrite NEGATIVE  NEGATIVE   Leukocytes, UA NEGATIVE  NEGATIVE   Comment: MICROSCOPIC NOT DONE ON URINES WITH NEGATIVE PROTEIN, BLOOD, LEUKOCYTES, NITRITE, OR GLUCOSE <1000 mg/dL.  PREGNANCY, URINE     Status: None   Collection Time    01/03/14  2:00 PM      Result Value Ref Range   Preg Test, Ur NEGATIVE  NEGATIVE   Comment:            THE SENSITIVITY OF THIS     METHODOLOGY IS >20 mIU/mL.  CBC WITH DIFFERENTIAL     Status: Abnormal   Collection Time    01/03/14  2:22 PM      Result Value Ref Range   WBC 11.3 (*) 4.0 - 10.5 K/uL   RBC 4.25  3.87 - 5.11 MIL/uL   Hemoglobin 13.9  12.0 - 15.0 g/dL   HCT 41.0  36.0 - 46.0 %   MCV 96.5  78.0 - 100.0 fL   MCH 32.7  26.0 - 34.0 pg   MCHC 33.9  30.0 - 36.0 g/dL   RDW  12.9  11.5 - 15.5 %   Platelets 246  150 - 400 K/uL   Neutrophils Relative % 75  43 - 77 %   Neutro Abs 8.5 (*) 1.7 - 7.7 K/uL   Lymphocytes Relative 16  12 - 46 %   Lymphs Abs 1.8  0.7 - 4.0 K/uL   Monocytes Relative 8  3 - 12 %   Monocytes Absolute 0.9  0.1 - 1.0 K/uL   Eosinophils Relative 1  0 - 5 %   Eosinophils Absolute 0.1  0.0 - 0.7 K/uL   Basophils Relative 0  0 - 1 %   Basophils Absolute 0.0  0.0 - 0.1 K/uL  COMPREHENSIVE METABOLIC PANEL     Status: None   Collection Time    01/03/14  2:22 PM      Result Value Ref Range   Sodium 140  137 - 147 mEq/L   Potassium 4.5  3.7 - 5.3 mEq/L   Chloride 103  96 - 112 mEq/L   CO2 23  19 - 32 mEq/L   Glucose, Bld 88  70 - 99 mg/dL   BUN 14  6 - 23 mg/dL   Creatinine, Ser 0.69  0.50 - 1.10 mg/dL   Calcium 9.7  8.4 - 10.5 mg/dL   Total Protein 7.8  6.0 - 8.3 g/dL   Albumin 4.6  3.5 - 5.2 g/dL   AST 20  0 - 37 U/L   ALT 17  0 - 35 U/L   Alkaline Phosphatase 48  39 - 117 U/L   Total Bilirubin 1.0  0.3 - 1.2 mg/dL   GFR calc non Af Amer >90  >90 mL/min   GFR calc Af Amer >90  >90 mL/min   Comment: (NOTE)     The eGFR  has been calculated using the CKD EPI equation.     This calculation has not been validated in all clinical situations.     eGFR's persistently <90 mL/min signify possible Chronic Kidney     Disease.   Anion gap 14  5 - 15  LIPASE, BLOOD     Status: None   Collection Time    01/03/14  2:22 PM      Result Value Ref Range   Lipase 38  11 - 59 U/L  MAGNESIUM     Status: None   Collection Time    01/04/14  5:15 AM      Result Value Ref Range   Magnesium 2.0  1.5 - 2.5 mg/dL  PHOSPHORUS     Status: None   Collection Time    01/04/14  5:15 AM      Result Value Ref Range   Phosphorus 3.8  2.3 - 4.6 mg/dL  TSH     Status: None   Collection Time    01/04/14  5:15 AM      Result Value Ref Range   TSH 1.380  0.350 - 4.500 uIU/mL   Comment: Performed at [facility] PANEL     Status: None   Collection Time    01/04/14  5:15 AM      Result Value Ref Range   Sodium 137  137 - 147 mEq/L   Potassium 4.3  3.7 - 5.3 mEq/L   Chloride 105  96 - 112 mEq/L   CO2 22  19 - 32 mEq/L   Glucose, Bld 79  70 - 99 mg/dL   BUN 13  6 - 23 mg/dL  Creatinine, Ser 0.80  0.50 - 1.10 mg/dL   Calcium 8.5  8.4 - 10.5 mg/dL   Total Protein 6.0  6.0 - 8.3 g/dL   Albumin 3.5  3.5 - 5.2 g/dL   AST 13  0 - 37 U/L   ALT 12  0 - 35 U/L   Alkaline Phosphatase 39  39 - 117 U/L   Total Bilirubin 1.0  0.3 - 1.2 mg/dL   GFR calc non Af Amer >90  >90 mL/min   GFR calc Af Amer >90  >90 mL/min   Comment: (NOTE)     The eGFR has been calculated using the CKD EPI equation.     This calculation has not been validated in all clinical situations.     eGFR's persistently <90 mL/min signify possible Chronic Kidney     Disease.   Anion gap 10  5 - 15  CBC     Status: Abnormal   Collection Time    01/04/14  5:15 AM      Result Value Ref Range   WBC 10.7 (*) 4.0 - 10.5 K/uL   RBC 3.62 (*) 3.87 - 5.11 MIL/uL   Hemoglobin 11.9 (*) 12.0 - 15.0 g/dL   HCT 34.9 (*) 36.0 - 46.0 %   MCV 96.4   78.0 - 100.0 fL   MCH 32.9  26.0 - 34.0 pg   MCHC 34.1  30.0 - 36.0 g/dL   RDW 12.8  11.5 - 15.5 %   Platelets 192  150 - 400 K/uL    US Abdomen Complete  01/03/2014   CLINICAL DATA:  Pain.  Evaluate for gallstones or cholecystitis.  EXAM: ULTRASOUND ABDOMEN COMPLETE  COMPARISON:  None.  FINDINGS: Gallbladder:  No gallstones or wall thickening visualized. No sonographic Murphy sign noted.  Common bile duct:  Diameter: 3 mm  Liver:  No focal lesion identified. Within normal limits in parenchymal echogenicity.  IVC:  No abnormality visualized.  Pancreas:  Visualized portion unremarkable.  Spleen:  Size and appearance within normal limits.  Right Kidney:  Length: 11 cm. Echogenicity within normal limits. No mass or hydronephrosis visualized.  Left Kidney:  Length: 10.7 cm. Echogenicity within normal limits. No mass or hydronephrosis visualized.  Abdominal aorta:  No aneurysm visualized.  Other findings:  None.  IMPRESSION: Abdominal ultrasound within normal.   Electronically Signed   By: Curlene Dolphin M.D.   On: 01/03/2014 17:56   Ct Abdomen Pelvis W Contrast  01/03/2014   CLINICAL DATA:  Epigastric pain.  Nausea.  EXAM: CT ABDOMEN AND PELVIS WITH CONTRAST  TECHNIQUE: Multidetector CT imaging of the abdomen and pelvis was performed using the standard protocol following bolus administration of intravenous contrast.  CONTRAST:  93m OMNIPAQUE IOHEXOL 300 MG/ML SOLN, 1027mOMNIPAQUE IOHEXOL 300 MG/ML SOLN  COMPARISON:  None.  FINDINGS: Musculoskeletal:  No aggressive osseous lesions.  Lung Bases: Mild atelectasis.  Liver:  Normal variant Riedel's lobe of the liver.  Spleen:  Normal.  Gallbladder:  Contracted gallbladder.  Common bile duct:  Normal.  Pancreas:  Normal.  Adrenal glands:  Normal bilaterally.  Kidneys: Small amount of contrast is present in the renal collecting systems bilaterally. Both ureters appear normal. Normal renal enhancement and excretion.  Stomach:  Distended with oral contrast.  Small  bowel: Normal duodenum. Progressive dilation of jejunum leading to a transition point in the LEFT mid abdomen. There are is angulated bowel at the transition point with decompression of bowel distally. The obstruction at the angulation appears  partial although there is no distal contrast and fluid fills small bowel loops extending into the pelvis.  Colon: Normal appendix. Prominent stool in the cecum. No inflammatory changes of colon or obstruction.  Pelvic Genitourinary: IUD. Partially collapsed urinary bladder. Small amount of free fluid in the anatomic pelvis which may be physiologic are reactive to obstruction.  Vasculature: Normal.  Body Wall: Normal.  IMPRESSION: Small bowel obstruction with transition point in the LEFT mid abdomen, probably due to adhesive band with angulation of small bowel loop. This appears to be a partial small bowel obstruction.   Electronically Signed   By: Dereck Ligas M.D.   On: 01/03/2014 20:44   Dg Abd Portable 1v  01/04/2014   CLINICAL DATA:  Follow-up small bowel obstruction  EXAM: PORTABLE ABDOMEN - 1 VIEW  COMPARISON:  Yesterday  FINDINGS: Enteric contrast has passed into the colon. Gas-filled loops of small bowel without disproportionate dilatation are present. Stable IUD in the pelvis. No obvious free intraperitoneal gas.  IMPRESSION: Resolved partial small bowel obstruction. Gas pattern is now nonobstructive.   Electronically Signed   By: Maryclare Bean M.D.   On: 01/04/2014 08:09    Review of Systems  Constitutional: Negative for weight loss.  HENT: Negative for ear discharge, ear pain, hearing loss and tinnitus.   Eyes: Negative for blurred vision, double vision, photophobia and pain.  Respiratory: Negative for cough, sputum production and shortness of breath.   Cardiovascular: Negative for chest pain.  Gastrointestinal: Positive for nausea, vomiting and abdominal pain.  Genitourinary: Negative for dysuria, urgency, frequency and flank pain.  Musculoskeletal:  Negative for back pain, falls, joint pain, myalgias and neck pain.  Neurological: Negative for dizziness, tingling, sensory change, focal weakness, loss of consciousness and headaches.  Endo/Heme/Allergies: Does not bruise/bleed easily.  Psychiatric/Behavioral: Negative for depression, memory loss and substance abuse. The patient is not nervous/anxious.    Blood pressure 96/44, pulse 56, temperature 98.1 F (36.7 C), temperature source Oral, resp. rate 16, height 5' 9"  (1.753 m), weight 181 lb 9.6 oz (82.373 kg), SpO2 99.00%, unknown if currently breastfeeding. Physical Exam  Vitals reviewed. Constitutional: She is oriented to person, place, and time. She appears well-developed and well-nourished. She is cooperative. No distress. Cervical collar and nasal cannula in place.  HENT:  Head: Normocephalic and atraumatic. Head is without raccoon's eyes, without Battle's sign, without abrasion, without contusion and without laceration.  Right Ear: Hearing, tympanic membrane, external ear and ear canal normal. No lacerations. No drainage or tenderness. No foreign bodies. Tympanic membrane is not perforated. No hemotympanum.  Left Ear: Hearing, tympanic membrane, external ear and ear canal normal. No lacerations. No drainage or tenderness. No foreign bodies. Tympanic membrane is not perforated. No hemotympanum.  Nose: Nose normal. No nose lacerations, sinus tenderness, nasal deformity or nasal septal hematoma. No epistaxis.  Mouth/Throat: Uvula is midline, oropharynx is clear and moist and mucous membranes are normal. No lacerations.  Eyes: Conjunctivae, EOM and lids are normal. Pupils are equal, round, and reactive to light. No scleral icterus.  Neck: Trachea normal. No JVD present. No spinous process tenderness and no muscular tenderness present. Carotid bruit is not present. No thyromegaly present.  Cardiovascular: Normal rate, regular rhythm, normal heart sounds, intact distal pulses and normal pulses.    Respiratory: Effort normal and breath sounds normal. No respiratory distress. She exhibits no tenderness, no bony tenderness, no laceration and no crepitus.  GI: Soft. Normal appearance. She exhibits no distension. Bowel sounds are decreased. There is  no tenderness. There is no rigidity, no rebound, no guarding and no CVA tenderness.  Musculoskeletal: Normal range of motion. She exhibits no edema and no tenderness.  Lymphadenopathy:    She has no cervical adenopathy.  Neurological: She is alert and oriented to person, place, and time. She has normal strength. No cranial nerve deficit or sensory deficit. GCS eye subscore is 4. GCS verbal subscore is 5. GCS motor subscore is 6.  Skin: Skin is warm, dry and intact. She is not diaphoretic.  Psychiatric: She has a normal mood and affect. Her speech is normal and behavior is normal.    Assessment/Plan: 33 year old female with left-sided abdominal pain, partial small bowel obstruction. 1. At this time we will hold off on NG tube secondary to the fact the patient has no ongoing nausea vomiting. 2. I did discuss with her we will treat this nonoperatively at this time in hopes that the partial obstruction will resolve I did discuss with her is a possibility that if this gets worse and becomes a complete bowel structure the patient may require operative treatment. The patient is in line with its this time. 3.  Encourage the patient to ambulate as much as possible.  Rosario Jacks., [facility] 01/04/2014, 9:46 AM

## 2014-01-04 NOTE — Progress Notes (Addendum)
[facility] PROGRESS NOTE  Cheryl Lara UDJ:[id number] DOB: 1980-12-02 DOA: 01/03/2014 PCP: No primary provider on file.  Assessment/Plan  Partial small bowel obstruction -  Continue IVF and antiemetics -  Add prn reglan  -  Encouraged her to minimize her narcotics and maximize her ambulation in order to heal faster -  Consider advancing diet tomorrow - will order CLD to start in AM -  Appreciate general surgery assistance  Leukocytosis, likely reactive from SBO, resolving Normocytic anemia, likely hemodilutional, no signs of active bleeding, repeat as outpatient  Diet:  NPO Access:  PIV IVF:  yes Proph:  lovenox  Code Status: full Family Communication: patient and her sister Disposition Plan: tolerating diet   Consultants:  General surgery   Procedures:  9/13  Abd [place]:  No abnl  9/13  CT ab/p:  SBO with transition point in the left mid abd probably due to adhesive band  9/14  KUB:  Resolved partial SBO  Antibiotics:  none   HPI/Subjective:   Persistent periumbilical pain but improved. Nausea improving.    Objective: Filed Vitals:   01/03/14 2257 01/04/14 0512 01/04/14 0524 01/04/14 1403  BP: 101/44  96/44 104/50  Pulse: 52  56 53  Temp: 97.5 F (36.4 C)  98.1 F (36.7 C) 98.6 F (37 C)  TempSrc: Oral  Oral Oral  Resp: 18  16 18   Height:  5\' 9"  (1.753 m)    Weight:  82.373 kg (181 lb 9.6 oz)    SpO2: 100%  99% 100%    Intake/Output Summary (Last 24 hours) at 01/04/14 1755 Last data filed at 01/04/14 0700  Gross per 24 hour  Intake 723.33 ml  Output      0 ml  Net 723.33 ml   Filed Weights   01/04/14 0512  Weight: 82.373 kg (181 lb 9.6 oz)    Exam:   General:  WF, No acute distress  HEENT:  NCAT, MMM  Cardiovascular:  RRR, nl S1, S2 no mrg, 2+ pulses, warm extremities  Respiratory:  CTAB, no increased WOB  Abdomen:   Hypoactive BS, soft, ND, TTP in RUQ and LUQ and periumbilical space without rebound or guarding  MSK:   Normal  tone and bulk, no LEE  Neuro:  Grossly intact  Data Reviewed: Basic Metabolic Panel:  Recent Labs Lab 01/03/14 1422 01/04/14 0515  NA 140 137  K 4.5 4.3  CL 103 105  CO2 23 22  GLUCOSE 88 79  BUN 14 13  CREATININE 0.69 0.80  CALCIUM 9.7 8.5  MG  --  2.0  PHOS  --  3.8   Liver Function Tests:  Recent Labs Lab 01/03/14 1422 01/04/14 0515  AST 20 13  ALT 17 12  ALKPHOS 48 39  BILITOT 1.0 1.0  PROT 7.8 6.0  ALBUMIN 4.6 3.5    Recent Labs Lab 01/03/14 1422  LIPASE 38   No results found for this basename: AMMONIA,  in the last 168 hours CBC:  Recent Labs Lab 01/03/14 1422 01/04/14 0515  WBC 11.3* 10.7*  NEUTROABS 8.5*  --   HGB 13.9 11.9*  HCT 41.0 34.9*  MCV 96.5 96.4  PLT 246 192   Cardiac Enzymes: No results found for this basename: CKTOTAL, CKMB, CKMBINDEX, TROPONINI,  in the last 168 hours BNP (last 3 results) No results found for this basename: PROBNP,  in the last 8760 hours CBG: No results found for this basename: GLUCAP,  in the last 168 hours  No  results found for this or any previous visit (from the past 240 hour(s)).   Studies: US Abdomen Complete  01/03/2014   CLINICAL DATA:  Pain.  Evaluate for gallstones or cholecystitis.  EXAM: ULTRASOUND ABDOMEN COMPLETE  COMPARISON:  None.  FINDINGS: Gallbladder:  No gallstones or wall thickening visualized. No sonographic Murphy sign noted.  Common bile duct:  Diameter: 3 mm  Liver:  No focal lesion identified. Within normal limits in parenchymal echogenicity.  IVC:  No abnormality visualized.  Pancreas:  Visualized portion unremarkable.  Spleen:  Size and appearance within normal limits.  Right Kidney:  Length: 11 cm. Echogenicity within normal limits. No mass or hydronephrosis visualized.  Left Kidney:  Length: 10.7 cm. Echogenicity within normal limits. No mass or hydronephrosis visualized.  Abdominal aorta:  No aneurysm visualized.  Other findings:  None.  IMPRESSION: Abdominal ultrasound within  normal.   Electronically Signed   By: Curlene Dolphin M.D.   On: 01/03/2014 17:56   Ct Abdomen Pelvis W Contrast  01/03/2014   CLINICAL DATA:  Epigastric pain.  Nausea.  EXAM: CT ABDOMEN AND PELVIS WITH CONTRAST  TECHNIQUE: Multidetector CT imaging of the abdomen and pelvis was performed using the standard protocol following bolus administration of intravenous contrast.  CONTRAST:  80mL OMNIPAQUE IOHEXOL 300 MG/ML SOLN, 159mL OMNIPAQUE IOHEXOL 300 MG/ML SOLN  COMPARISON:  None.  FINDINGS: Musculoskeletal:  No aggressive osseous lesions.  Lung Bases: Mild atelectasis.  Liver:  Normal variant Riedel's lobe of the liver.  Spleen:  Normal.  Gallbladder:  Contracted gallbladder.  Common bile duct:  Normal.  Pancreas:  Normal.  Adrenal glands:  Normal bilaterally.  Kidneys: Small amount of contrast is present in the renal collecting systems bilaterally. Both ureters appear normal. Normal renal enhancement and excretion.  Stomach:  Distended with oral contrast.  Small bowel: Normal duodenum. Progressive dilation of jejunum leading to a transition point in the LEFT mid abdomen. There are is angulated bowel at the transition point with decompression of bowel distally. The obstruction at the angulation appears partial although there is no distal contrast and fluid fills small bowel loops extending into the pelvis.  Colon: Normal appendix. Prominent stool in the cecum. No inflammatory changes of colon or obstruction.  Pelvic Genitourinary: IUD. Partially collapsed urinary bladder. Small amount of free fluid in the anatomic pelvis which may be physiologic are reactive to obstruction.  Vasculature: Normal.  Body Wall: Normal.  IMPRESSION: Small bowel obstruction with transition point in the LEFT mid abdomen, probably due to adhesive band with angulation of small bowel loop. This appears to be a partial small bowel obstruction.   Electronically Signed   By: Dereck Ligas M.D.   On: 01/03/2014 20:44   Dg Abd Portable  1v  01/04/2014   CLINICAL DATA:  Follow-up small bowel obstruction  EXAM: PORTABLE ABDOMEN - 1 VIEW  COMPARISON:  Yesterday  FINDINGS: Enteric contrast has passed into the colon. Gas-filled loops of small bowel without disproportionate dilatation are present. Stable IUD in the pelvis. No obvious free intraperitoneal gas.  IMPRESSION: Resolved partial small bowel obstruction. Gas pattern is now nonobstructive.   Electronically Signed   By: Maryclare Bean M.D.   On: 01/04/2014 08:09    Scheduled Meds: . amphetamine-dextroamphetamine  15 mg Oral QAC breakfast  . antiseptic oral rinse  7 mL Mouth Rinse q12n4p  . chlorhexidine  15 mL Mouth Rinse BID  . enoxaparin (LOVENOX) injection  40 mg Subcutaneous QHS  . [START ON 01/05/2014] Influenza vac  split quadrivalent PF  0.5 mL Intramuscular Tomorrow-1000  . pantoprazole (PROTONIX) IV  40 mg Intravenous Q24H   Continuous Infusions: . sodium chloride 100 mL/hr at 01/04/14 0932    Active Problems:   Partial small bowel obstruction   SBO (small bowel obstruction)    Time spent: 30 min    Caid Radin, [facility]  [facility] Pager [phone]. If 7PM-7AM, please contact night-coverage at [email or web address], password [facility] 01/04/2014, 5:55 PM  LOS: 1 day

## 2014-01-05 MED ORDER — PANTOPRAZOLE SODIUM 40 MG PO TBEC
40.0000 mg | DELAYED_RELEASE_TABLET | Freq: Every day | ORAL | Status: DC
  Administered 2014-01-06: 40 mg via ORAL
  Filled 2014-01-05: qty 1

## 2014-01-05 MED ORDER — DOCUSATE SODIUM 100 MG PO CAPS
100.0000 mg | ORAL_CAPSULE | Freq: Two times a day (BID) | ORAL | Status: DC
  Administered 2014-01-05 – 2014-01-06 (×2): 100 mg via ORAL
  Filled 2014-01-05 (×3): qty 1

## 2014-01-05 NOTE — Progress Notes (Signed)
Patient ID: Cheryl Lara, female   DOB: 1981-03-31, 33 y.o.   MRN: [id number]    Subjective: Pt feels better today.  Still with some left sided tenderness, but improved.  Tolerating her clear liquids this morning.  Objective: Vital signs in last 24 hours: Temp:  [98.3 F (36.8 C)-98.6 F (37 C)] 98.3 F (36.8 C) (09/15 0535) Pulse Rate:  [46-53] 46 (09/15 0535) Resp:  [18] 18 (09/15 0535) BP: (100-110)/(44-50) 100/47 mmHg (09/15 0535) SpO2:  [98 %-100 %] 98 % (09/15 0535) Last BM Date: 01/03/14  Intake/Output from previous day: 09/14 0701 - 09/15 0700 In: 1100 [I.V.:1100] Out: -  Intake/Output this shift: Total I/O In: 706.7 [P.O.:400; I.V.:306.7] Out: -   PE: Abd: soft, mild upper abdominal and LUQ tenderness, +BS, ND  Lab Results:   Recent Labs  01/03/14 1422 01/04/14 0515  WBC 11.3* 10.7*  HGB 13.9 11.9*  HCT 41.0 34.9*  PLT 246 192   BMET  Recent Labs  01/03/14 1422 01/04/14 0515  NA 140 137  K 4.5 4.3  CL 103 105  CO2 23 22  GLUCOSE 88 79  BUN 14 13  CREATININE 0.69 0.80  CALCIUM 9.7 8.5   PT/INR No results found for this basename: LABPROT, INR,  in the last 72 hours CMP     Component Value Date/Time   NA 137 01/04/2014 0515   K 4.3 01/04/2014 0515   CL 105 01/04/2014 0515   CO2 22 01/04/2014 0515   GLUCOSE 79 01/04/2014 0515   BUN 13 01/04/2014 0515   CREATININE 0.80 01/04/2014 0515   CALCIUM 8.5 01/04/2014 0515   PROT 6.0 01/04/2014 0515   ALBUMIN 3.5 01/04/2014 0515   AST 13 01/04/2014 0515   ALT 12 01/04/2014 0515   ALKPHOS 39 01/04/2014 0515   BILITOT 1.0 01/04/2014 0515   GFRNONAA >90 01/04/2014 0515   GFRAA >90 01/04/2014 0515   Lipase     Component Value Date/Time   LIPASE 38 01/03/2014 1422       Studies/Results: US Abdomen Complete  01/03/2014   CLINICAL DATA:  Pain.  Evaluate for gallstones or cholecystitis.  EXAM: ULTRASOUND ABDOMEN COMPLETE  COMPARISON:  None.  FINDINGS: Gallbladder:  No gallstones or wall thickening visualized.  No sonographic Murphy sign noted.  Common bile duct:  Diameter: 3 mm  Liver:  No focal lesion identified. Within normal limits in parenchymal echogenicity.  IVC:  No abnormality visualized.  Pancreas:  Visualized portion unremarkable.  Spleen:  Size and appearance within normal limits.  Right Kidney:  Length: 11 cm. Echogenicity within normal limits. No mass or hydronephrosis visualized.  Left Kidney:  Length: 10.7 cm. Echogenicity within normal limits. No mass or hydronephrosis visualized.  Abdominal aorta:  No aneurysm visualized.  Other findings:  None.  IMPRESSION: Abdominal ultrasound within normal.   Electronically Signed   By: Curlene Dolphin M.D.   On: 01/03/2014 17:56   Ct Abdomen Pelvis W Contrast  01/03/2014   CLINICAL DATA:  Epigastric pain.  Nausea.  EXAM: CT ABDOMEN AND PELVIS WITH CONTRAST  TECHNIQUE: Multidetector CT imaging of the abdomen and pelvis was performed using the standard protocol following bolus administration of intravenous contrast.  CONTRAST:  92mL OMNIPAQUE IOHEXOL 300 MG/ML SOLN, 117mL OMNIPAQUE IOHEXOL 300 MG/ML SOLN  COMPARISON:  None.  FINDINGS: Musculoskeletal:  No aggressive osseous lesions.  Lung Bases: Mild atelectasis.  Liver:  Normal variant Riedel's lobe of the liver.  Spleen:  Normal.  Gallbladder:  Contracted gallbladder.  Common bile duct:  Normal.  Pancreas:  Normal.  Adrenal glands:  Normal bilaterally.  Kidneys: Small amount of contrast is present in the renal collecting systems bilaterally. Both ureters appear normal. Normal renal enhancement and excretion.  Stomach:  Distended with oral contrast.  Small bowel: Normal duodenum. Progressive dilation of jejunum leading to a transition point in the LEFT mid abdomen. There are is angulated bowel at the transition point with decompression of bowel distally. The obstruction at the angulation appears partial although there is no distal contrast and fluid fills small bowel loops extending into the pelvis.  Colon: Normal  appendix. Prominent stool in the cecum. No inflammatory changes of colon or obstruction.  Pelvic Genitourinary: IUD. Partially collapsed urinary bladder. Small amount of free fluid in the anatomic pelvis which may be physiologic are reactive to obstruction.  Vasculature: Normal.  Body Wall: Normal.  IMPRESSION: Small bowel obstruction with transition point in the LEFT mid abdomen, probably due to adhesive band with angulation of small bowel loop. This appears to be a partial small bowel obstruction.   Electronically Signed   By: Dereck Ligas M.D.   On: 01/03/2014 20:44   Dg Abd Portable 1v  01/04/2014   CLINICAL DATA:  Follow-up small bowel obstruction  EXAM: PORTABLE ABDOMEN - 1 VIEW  COMPARISON:  Yesterday  FINDINGS: Enteric contrast has passed into the colon. Gas-filled loops of small bowel without disproportionate dilatation are present. Stable IUD in the pelvis. No obvious free intraperitoneal gas.  IMPRESSION: Resolved partial small bowel obstruction. Gas pattern is now nonobstructive.   Electronically Signed   By: Maryclare Bean M.D.   On: 01/04/2014 08:09    Anti-infectives: Anti-infectives   None       Assessment/Plan   1. PSBO 2. S/p Nissen fundoplication  Plan: 1. Patient's abdominal films showed a resolved PSBO yesterday.  She is on clear liquids today and is tolerating these so far.  She cane be advanced as tolerates.  No surgical indications.  We will sign off.  LOS: 2 days    Haelyn Forgey E 01/05/2014, 9:24 AM Pager: [phone]

## 2014-01-05 NOTE — Progress Notes (Addendum)
[facility] PROGRESS NOTE  Cheryl Lara NLZ:[id number] DOB: 02/17/1981 DOA: 01/03/2014 PCP: No primary provider on file.  Brief summary  Cheryl Lara is a 33 y.o. female has a past medical history of Asthma; GERD (gastroesophageal reflux disease); HPV (human papilloma virus) infection; Scarlet fever; and Celiac disease who pw SBO which is resolving.  D/c in AM if tolerating meals.    Assessment/Plan  Partial small bowel obstruction, resolving.  Passing flatus and pain improved -  Advance diet -  D/c IVF -  Continue ambulation -  If tolerating dinner and breakfast tomorrow, okay to discharge to home.  -  Appreciate general surgery assistance  Leukocytosis, likely reactive from SBO, resolving  Normocytic anemia, likely hemodilutional, no signs of active bleeding, repeat as outpatient  Diet:  Advance to regular Access:  PIV IVF:  off Proph:  lovenox  Code Status: full Family Communication: patient and her sister Disposition Plan: if tolerating diet, home tomorrow AM    Consultants:  General surgery   Procedures:  9/13  Abd [place]:  No abnl  9/13  CT ab/p:  SBO with transition point in the left mid abd probably due to adhesive band  9/14  KUB:  Resolved partial SBO  Antibiotics:  none   HPI/Subjective:   Persistent periumbilical pain but down to 3-4/10. Nausea resolved.  Passed some flatus, no BMs.  Feels bloated.    Objective: Filed Vitals:   01/04/14 1403 01/04/14 2125 01/04/14 2229 01/05/14 0535  BP: 104/50 110/44  100/47  Pulse: 53 46 52 46  Temp: 98.6 F (37 C) 98.3 F (36.8 C)  98.3 F (36.8 C)  TempSrc: Oral Oral  Oral  Resp: 18 18  18   Height:      Weight:      SpO2: 100% 100%  98%    Intake/Output Summary (Last 24 hours) at 01/05/14 1442 Last data filed at 01/05/14 1151  Gross per 24 hour  Intake 2626.67 ml  Output      0 ml  Net 2626.67 ml   Filed Weights   01/04/14 0512  Weight: 82.373 kg (181 lb 9.6 oz)    Exam:   General:   WF, No acute distress  HEENT:  NCAT, MMM  Cardiovascular:  RRR, nl S1, S2 no mrg, 2+ pulses, warm extremities  Respiratory:  CTAB, no increased WOB  Abdomen:   NABS, soft, mildly distended, mild TTP in epigastrium/periumbilical area without rebound or guarding  MSK:   Normal tone and bulk, no LEE  Neuro:  Grossly intact  Data Reviewed: Basic Metabolic Panel:  Recent Labs Lab 01/03/14 1422 01/04/14 0515  NA 140 137  K 4.5 4.3  CL 103 105  CO2 23 22  GLUCOSE 88 79  BUN 14 13  CREATININE 0.69 0.80  CALCIUM 9.7 8.5  MG  --  2.0  PHOS  --  3.8   Liver Function Tests:  Recent Labs Lab 01/03/14 1422 01/04/14 0515  AST 20 13  ALT 17 12  ALKPHOS 48 39  BILITOT 1.0 1.0  PROT 7.8 6.0  ALBUMIN 4.6 3.5    Recent Labs Lab 01/03/14 1422  LIPASE 38   No results found for this basename: AMMONIA,  in the last 168 hours CBC:  Recent Labs Lab 01/03/14 1422 01/04/14 0515  WBC 11.3* 10.7*  NEUTROABS 8.5*  --   HGB 13.9 11.9*  HCT 41.0 34.9*  MCV 96.5 96.4  PLT 246 192   Cardiac Enzymes: No results found  for this basename: CKTOTAL, CKMB, CKMBINDEX, TROPONINI,  in the last 168 hours BNP (last 3 results) No results found for this basename: PROBNP,  in the last 8760 hours CBG: No results found for this basename: GLUCAP,  in the last 168 hours  No results found for this or any previous visit (from the past 240 hour(s)).   Studies: US Abdomen Complete  01/03/2014   CLINICAL DATA:  Pain.  Evaluate for gallstones or cholecystitis.  EXAM: ULTRASOUND ABDOMEN COMPLETE  COMPARISON:  None.  FINDINGS: Gallbladder:  No gallstones or wall thickening visualized. No sonographic Murphy sign noted.  Common bile duct:  Diameter: 3 mm  Liver:  No focal lesion identified. Within normal limits in parenchymal echogenicity.  IVC:  No abnormality visualized.  Pancreas:  Visualized portion unremarkable.  Spleen:  Size and appearance within normal limits.  Right Kidney:  Length: 11 cm.  Echogenicity within normal limits. No mass or hydronephrosis visualized.  Left Kidney:  Length: 10.7 cm. Echogenicity within normal limits. No mass or hydronephrosis visualized.  Abdominal aorta:  No aneurysm visualized.  Other findings:  None.  IMPRESSION: Abdominal ultrasound within normal.   Electronically Signed   By: Curlene Dolphin M.D.   On: 01/03/2014 17:56   Ct Abdomen Pelvis W Contrast  01/03/2014   CLINICAL DATA:  Epigastric pain.  Nausea.  EXAM: CT ABDOMEN AND PELVIS WITH CONTRAST  TECHNIQUE: Multidetector CT imaging of the abdomen and pelvis was performed using the standard protocol following bolus administration of intravenous contrast.  CONTRAST:  74mL OMNIPAQUE IOHEXOL 300 MG/ML SOLN, 152mL OMNIPAQUE IOHEXOL 300 MG/ML SOLN  COMPARISON:  None.  FINDINGS: Musculoskeletal:  No aggressive osseous lesions.  Lung Bases: Mild atelectasis.  Liver:  Normal variant Riedel's lobe of the liver.  Spleen:  Normal.  Gallbladder:  Contracted gallbladder.  Common bile duct:  Normal.  Pancreas:  Normal.  Adrenal glands:  Normal bilaterally.  Kidneys: Small amount of contrast is present in the renal collecting systems bilaterally. Both ureters appear normal. Normal renal enhancement and excretion.  Stomach:  Distended with oral contrast.  Small bowel: Normal duodenum. Progressive dilation of jejunum leading to a transition point in the LEFT mid abdomen. There are is angulated bowel at the transition point with decompression of bowel distally. The obstruction at the angulation appears partial although there is no distal contrast and fluid fills small bowel loops extending into the pelvis.  Colon: Normal appendix. Prominent stool in the cecum. No inflammatory changes of colon or obstruction.  Pelvic Genitourinary: IUD. Partially collapsed urinary bladder. Small amount of free fluid in the anatomic pelvis which may be physiologic are reactive to obstruction.  Vasculature: Normal.  Body Wall: Normal.  IMPRESSION: Small  bowel obstruction with transition point in the LEFT mid abdomen, probably due to adhesive band with angulation of small bowel loop. This appears to be a partial small bowel obstruction.   Electronically Signed   By: Dereck Ligas M.D.   On: 01/03/2014 20:44   Dg Abd Portable 1v  01/04/2014   CLINICAL DATA:  Follow-up small bowel obstruction  EXAM: PORTABLE ABDOMEN - 1 VIEW  COMPARISON:  Yesterday  FINDINGS: Enteric contrast has passed into the colon. Gas-filled loops of small bowel without disproportionate dilatation are present. Stable IUD in the pelvis. No obvious free intraperitoneal gas.  IMPRESSION: Resolved partial small bowel obstruction. Gas pattern is now nonobstructive.   Electronically Signed   By: Maryclare Bean M.D.   On: 01/04/2014 08:09    Scheduled  Meds: . amphetamine-dextroamphetamine  15 mg Oral QAC breakfast  . antiseptic oral rinse  7 mL Mouth Rinse q12n4p  . chlorhexidine  15 mL Mouth Rinse BID  . docusate sodium  100 mg Oral BID  . enoxaparin (LOVENOX) injection  40 mg Subcutaneous QHS  . Influenza vac split quadrivalent PF  0.5 mL Intramuscular Tomorrow-1000  . [START ON 01/06/2014] pantoprazole  40 mg Oral Daily   Continuous Infusions:    Active Problems:   Partial small bowel obstruction   SBO (small bowel obstruction)    Time spent: 30 min    Darianne Muralles, [place]  [facility] Pager [phone]. If 7PM-7AM, please contact night-coverage at [email or web address], password [email or web address] 01/05/2014, 2:42 PM  LOS: 2 days

## 2014-01-05 NOTE — Progress Notes (Signed)
I have seen and examined the pt and agree with PA-Osborne's progress note.  

## 2014-01-06 DIAGNOSIS — D649 Anemia, unspecified: Secondary | ICD-10-CM

## 2014-01-06 NOTE — Discharge Summary (Signed)
Physician Discharge Summary  Cheryl Lara ZOX:[id number] DOB: 1981/02/16 DOA: 01/03/2014  PCP: [name], MD  Admit date: 01/03/2014 Discharge date: 01/06/2014  Time spent: Less than 30 minutes  Recommendations for Outpatient Follow-up:  1. Dr. [name], PCP in 5 days with repeat labs (CBC).  Discharge Diagnoses:  Active Problems:   Partial small bowel obstruction   SBO (small bowel obstruction)   Discharge Condition: Improved & Stable  Diet recommendation: regular diet  Filed Weights   01/04/14 0512  Weight: 82.373 kg (181 lb 9.6 oz)    History of present illness & hospital course:  33 year old female patient with history of laparoscopic Nissen fundoplication hiatal hernia repair in 2004, GERD, asthma, physically very active, presented with complaints of abdominal distention and pain, nausea and vomiting. In the ED, CT abdomen showed partial small bowel obstruction. She was treated supportively with n.p.o./bowel rest, IV fluids and pain management. She did not require NG tube decompression. Ambulation was encouraged. Surgery was consulted and agreed with conservative management. With these measures, she gradually improved with resolution of nausea, vomiting, abdominal distention and pain. Diet was gradually advanced which she tolerated. She had normal BM on day of discharge. PSBO resolved. She was advised that her PSBO may have been secondary to adhesions from prior abdominal procedures.  Patient had mild occasional dizziness on ambulating the night prior to discharge. This was associated with soft blood pressures. This resolved spontaneously and her blood pressure on discharge was 114/78 mmHg. She had periods of asymptomatic bradycardia-mostly in the 50s and occasionally in the 40s, since admission- which may be physiological secondary to her physically active status-runs 3 miles several times per week. TSH normal. Encouraged ad lib. oral fluid intake and gradual return to full activity.  She verbalized understanding.  Mild leukocytosis was likely related to stress from partial small bowel obstruction. Improved. No clinical features suggestive of infection.  Mild anemia: May be dilutional. May also have chronic anemia-hemoglobin in June 2014 arranged between 9.4-11.6. Recommend follow up CBC in a few days. No reported bleeding.  GERD: As per patient, not very symptomatic at this time and hence not on medications.  Asthma: Stable.   Consultations:  General surgery  Procedures:  None    Discharge Exam:  Complaints:  Tolerated regular diet. No nausea, vomiting or abdominal pain. Had normal BM. No dizziness on ambulating today.  Filed Vitals:   01/05/14 1500 01/05/14 2116 01/06/14 0526 01/06/14 0900  BP: 123/50 120/59 92/53 114/78  Pulse: 78 68 65   Temp: 98.4 F (36.9 C) 98.2 F (36.8 C) 98.1 F (36.7 C)   TempSrc: Oral Oral Oral   Resp: 16 18 16    Height:      Weight:      SpO2: 100% 92% 98%     General exam: Pleasant young female sitting up comfortably in bed Respiratory system: Clear. No increased work of breathing. Cardiovascular system: S1 & S2 heard, RRR. No JVD, murmurs, gallops, clicks or pedal edema. Gastrointestinal system: Abdomen is nondistended, soft and nontender. Laparoscopic surgery scars x4. Normal bowel sounds heard. Central nervous system: Alert and oriented. No focal neurological deficits. Extremities: Symmetric 5 x 5 power.  Discharge Instructions      Discharge Instructions   Activity as tolerated - No restrictions    Complete by:  As directed      Call MD for:  persistant nausea and vomiting    Complete by:  As directed      Call MD for:  severe uncontrolled pain    Complete by:  As directed      Diet general    Complete by:  As directed             Medication List         amphetamine-dextroamphetamine 15 MG tablet  Commonly known as:  ADDERALL  Take 15 mg by mouth daily.     ibuprofen 200 MG tablet  Commonly  known as:  ADVIL,MOTRIN  Take 400 mg by mouth every 6 (six) hours as needed.     multivitamin with minerals Tabs tablet  Take 1 tablet by mouth daily.       Follow-up Information   Follow up with [facility], MD. Schedule an appointment as soon as possible for a visit in 5 days. (To be seen with repeat labs (CBC).)    Specialty:  Internal Medicine   Contact information:   [place], [place]  [address] [phone]        The results of significant diagnostics from this hospitalization (including imaging, microbiology, ancillary and laboratory) are listed below for reference.    Significant Diagnostic Studies: US Abdomen Complete  01/03/2014   CLINICAL DATA:  Pain.  Evaluate for gallstones or cholecystitis.  EXAM: ULTRASOUND ABDOMEN COMPLETE  COMPARISON:  None.  FINDINGS: Gallbladder:  No gallstones or wall thickening visualized. No sonographic Murphy sign noted.  Common bile duct:  Diameter: 3 mm  Liver:  No focal lesion identified. Within normal limits in parenchymal echogenicity.  IVC:  No abnormality visualized.  Pancreas:  Visualized portion unremarkable.  Spleen:  Size and appearance within normal limits.  Right Kidney:  Length: 11 cm. Echogenicity within normal limits. No mass or hydronephrosis visualized.  Left Kidney:  Length: 10.7 cm. Echogenicity within normal limits. No mass or hydronephrosis visualized.  Abdominal aorta:  No aneurysm visualized.  Other findings:  None.  IMPRESSION: Abdominal ultrasound within normal.   Electronically Signed   By: Curlene Dolphin M.D.   On: 01/03/2014 17:56   Ct Abdomen Pelvis W Contrast  01/03/2014   CLINICAL DATA:  Epigastric pain.  Nausea.  EXAM: CT ABDOMEN AND PELVIS WITH CONTRAST  TECHNIQUE: Multidetector CT imaging of the abdomen and pelvis was performed using the standard protocol following bolus administration of intravenous contrast.  CONTRAST:  42mL OMNIPAQUE IOHEXOL 300 MG/ML SOLN, 144mL OMNIPAQUE IOHEXOL 300 MG/ML  SOLN  COMPARISON:  None.  FINDINGS: Musculoskeletal:  No aggressive osseous lesions.  Lung Bases: Mild atelectasis.  Liver:  Normal variant Riedel's lobe of the liver.  Spleen:  Normal.  Gallbladder:  Contracted gallbladder.  Common bile duct:  Normal.  Pancreas:  Normal.  Adrenal glands:  Normal bilaterally.  Kidneys: Small amount of contrast is present in the renal collecting systems bilaterally. Both ureters appear normal. Normal renal enhancement and excretion.  Stomach:  Distended with oral contrast.  Small bowel: Normal duodenum. Progressive dilation of jejunum leading to a transition point in the LEFT mid abdomen. There are is angulated bowel at the transition point with decompression of bowel distally. The obstruction at the angulation appears partial although there is no distal contrast and fluid fills small bowel loops extending into the pelvis.  Colon: Normal appendix. Prominent stool in the cecum. No inflammatory changes of colon or obstruction.  Pelvic Genitourinary: IUD. Partially collapsed urinary bladder. Small amount of free fluid in the anatomic pelvis which may be physiologic are reactive to obstruction.  Vasculature: Normal.  Body Wall: Normal.  IMPRESSION: Small  bowel obstruction with transition point in the LEFT mid abdomen, probably due to adhesive band with angulation of small bowel loop. This appears to be a partial small bowel obstruction.   Electronically Signed   By: Dereck Ligas M.D.   On: 01/03/2014 20:44   Dg Abd Portable 1v  01/04/2014   CLINICAL DATA:  Follow-up small bowel obstruction  EXAM: PORTABLE ABDOMEN - 1 VIEW  COMPARISON:  Yesterday  FINDINGS: Enteric contrast has passed into the colon. Gas-filled loops of small bowel without disproportionate dilatation are present. Stable IUD in the pelvis. No obvious free intraperitoneal gas.  IMPRESSION: Resolved partial small bowel obstruction. Gas pattern is now nonobstructive.   Electronically Signed   By: Maryclare Bean M.D.   On:  01/04/2014 08:09    Microbiology: No results found for this or any previous visit (from the past 240 hour(s)).   Labs: Basic Metabolic Panel:  Recent Labs Lab 01/03/14 1422 01/04/14 0515  NA 140 137  K 4.5 4.3  CL 103 105  CO2 23 22  GLUCOSE 88 79  BUN 14 13  CREATININE 0.69 0.80  CALCIUM 9.7 8.5  MG  --  2.0  PHOS  --  3.8   Liver Function Tests:  Recent Labs Lab 01/03/14 1422 01/04/14 0515  AST 20 13  ALT 17 12  ALKPHOS 48 39  BILITOT 1.0 1.0  PROT 7.8 6.0  ALBUMIN 4.6 3.5    Recent Labs Lab 01/03/14 1422  LIPASE 38   No results found for this basename: AMMONIA,  in the last 168 hours CBC:  Recent Labs Lab 01/03/14 1422 01/04/14 0515  WBC 11.3* 10.7*  NEUTROABS 8.5*  --   HGB 13.9 11.9*  HCT 41.0 34.9*  MCV 96.5 96.4  PLT 246 192   Cardiac Enzymes: No results found for this basename: CKTOTAL, CKMB, CKMBINDEX, TROPONINI,  in the last 168 hours BNP: BNP (last 3 results) No results found for this basename: PROBNP,  in the last 8760 hours CBG: No results found for this basename: GLUCAP,  in the last 168 hours  Additional labs: 1. TSH: 1.380 2. Urine pregnancy test: Negative   Signed:  Vernell Leep, MD, FACP, FHM. [facility] Pager [phone]  If 7PM-7AM, please contact night-coverage [email or web address] Password [email or web address] 01/06/2014, 11:13 AM

## 2014-01-06 NOTE — Discharge Instructions (Signed)
Small Bowel Obstruction °A small bowel obstruction is a blockage (obstruction) of the small intestine (small bowel). The small bowel is a long, slender tube that connects the stomach to the colon. Its job is to absorb nutrients from the fluids and foods you consume into the bloodstream.  °CAUSES  °There are many causes of intestinal blockage. The most common ones include: °· Hernias. This is a more common cause in children than adults. °· Inflammatory bowel disease (enteritis and colitis). °· Twisting of the bowel (volvulus). °· Tumors. °· Scar tissue (adhesions) from previous surgery or radiation treatment. °· Recent surgery. This may cause an acute small bowel obstruction called an ileus. °SYMPTOMS  °· Abdominal pain. This may be dull cramps or sharp pain. It may occur in one area or may be present in the entire abdomen. Pain can range from mild to severe, depending on the degree of obstruction. °· Nausea and vomiting. Vomit may be greenish or yellow bile color. °· Distended or swollen stomach. Abdominal bloating is a common symptom. °· Constipation. °· Lack of passing gas. °· Frequent belching. °· Diarrhea. This may occur if runny stool is able to leak around the obstruction. °DIAGNOSIS  °Your caregiver can usually diagnose small bowel obstruction by taking a history, doing a physical exam, and taking X-rays. If the cause is unclear, a CT scan (computerized tomography) of your abdomen and pelvis may be needed. °TREATMENT  °Treatment of the blockage depends on the cause and how bad the problem is.  °· Sometimes, the obstruction improves with bed rest and intravenous (IV) fluids. °· Resting the bowel is very important. This means following a simple diet. Sometimes, a clear liquid diet may be required for several days. °· Sometimes, a small tube (nasogastric tube) is placed into the stomach to decompress the bowel. When the bowel is blocked, it usually swells up like a balloon filled with air and fluids.  Decompression means that the air and fluids are removed by suction through that tube. This can help with pain, discomfort, and nausea. It can also help the obstruction resolve faster. °· Surgery may be required if other treatments do not work. Bowel obstruction from a hernia may require early surgery and can be an emergency procedure. Adhesions that cause frequent or severe obstructions may also require surgery. °HOME CARE INSTRUCTIONS °If your bowel obstruction is only partial or incomplete, you may be allowed to go home. °· Get plenty of rest. °· Follow your diet as directed by your caregiver. °· Only consume clear liquids until your condition improves. °· Avoid solid foods as instructed. °SEEK IMMEDIATE MEDICAL CARE IF: °· You have increased pain or cramping. °· You vomit blood. °· You have uncontrolled vomiting or nausea. °· You cannot drink fluids due to vomiting or pain. °· You develop confusion. °· You begin feeling very dry or thirsty (dehydrated). °· You have severe bloating. °· You have chills. °· You have a fever. °· You feel extremely weak or you faint. °MAKE SURE YOU: °· Understand these instructions. °· Will watch your condition. °· Will get help right away if you are not doing well or get worse. °Document Released: 06/26/2005 Document Revised: 07/02/2011 Document Reviewed: 06/23/2010 °[employer]® Patient Information ©2015 [employer]. This information is not intended to replace advice given to you by your health care provider. Make sure you discuss any questions you have with your health care provider. ° °

## 2014-01-06 NOTE — Progress Notes (Signed)
Discharge instructions and medications reviewed with patient. Patient verbalizes understanding and has no questions at this time. Patient confirms she has all personal belongings in possession. Patient discharged home.

## 2014-02-22 ENCOUNTER — Encounter (HOSPITAL_COMMUNITY): Payer: Self-pay | Admitting: Emergency Medicine

## 2014-07-05 LAB — OB RESULTS CONSOLE RUBELLA ANTIBODY, IGM: Rubella: IMMUNE

## 2014-07-05 LAB — OB RESULTS CONSOLE GC/CHLAMYDIA
Chlamydia: NEGATIVE
Gonorrhea: NEGATIVE

## 2014-07-05 LAB — OB RESULTS CONSOLE ABO/RH: RH TYPE: POSITIVE

## 2014-07-05 LAB — OB RESULTS CONSOLE HEPATITIS B SURFACE ANTIGEN: Hepatitis B Surface Ag: NEGATIVE

## 2014-07-05 LAB — OB RESULTS CONSOLE RPR: RPR: NONREACTIVE

## 2014-07-05 LAB — OB RESULTS CONSOLE HIV ANTIBODY (ROUTINE TESTING): HIV: NONREACTIVE

## 2014-07-05 LAB — OB RESULTS CONSOLE ANTIBODY SCREEN: ANTIBODY SCREEN: NEGATIVE

## 2014-11-02 LAB — OB RESULTS CONSOLE GBS: GBS: NEGATIVE

## 2014-11-20 ENCOUNTER — Inpatient Hospital Stay (HOSPITAL_COMMUNITY): Payer: No Typology Code available for payment source

## 2014-11-20 ENCOUNTER — Encounter (HOSPITAL_COMMUNITY): Payer: Self-pay | Admitting: *Deleted

## 2014-11-20 ENCOUNTER — Inpatient Hospital Stay (HOSPITAL_COMMUNITY)
Admission: AD | Admit: 2014-11-20 | Discharge: 2014-11-30 | DRG: 765 | Disposition: A | Discharge status: Home / Self Care | Payer: No Typology Code available for payment source | Source: Ambulatory Visit | Attending: Obstetrics and Gynecology | Admitting: Obstetrics and Gynecology

## 2014-11-20 DIAGNOSIS — K219 Gastro-esophageal reflux disease without esophagitis: Secondary | ICD-10-CM | POA: Diagnosis present

## 2014-11-20 DIAGNOSIS — O30009 Twin pregnancy, unspecified number of placenta and unspecified number of amniotic sacs, unspecified trimester: Secondary | ICD-10-CM

## 2014-11-20 DIAGNOSIS — O2343 Unspecified infection of urinary tract in pregnancy, third trimester: Secondary | ICD-10-CM | POA: Diagnosis present

## 2014-11-20 DIAGNOSIS — O30043 Twin pregnancy, dichorionic/diamniotic, third trimester: Secondary | ICD-10-CM | POA: Diagnosis present

## 2014-11-20 DIAGNOSIS — Z3A30 30 weeks gestation of pregnancy: Secondary | ICD-10-CM | POA: Diagnosis present

## 2014-11-20 DIAGNOSIS — O328XX2 Maternal care for other malpresentation of fetus, fetus 2: Secondary | ICD-10-CM | POA: Diagnosis present

## 2014-11-20 DIAGNOSIS — O9962 Diseases of the digestive system complicating childbirth: Secondary | ICD-10-CM | POA: Diagnosis present

## 2014-11-20 DIAGNOSIS — O9081 Anemia of the puerperium: Secondary | ICD-10-CM | POA: Diagnosis not present

## 2014-11-20 DIAGNOSIS — O4703 False labor before 37 completed weeks of gestation, third trimester: Secondary | ICD-10-CM | POA: Diagnosis present

## 2014-11-20 DIAGNOSIS — D62 Acute posthemorrhagic anemia: Secondary | ICD-10-CM | POA: Diagnosis not present

## 2014-11-20 DIAGNOSIS — IMO0001 Reserved for inherently not codable concepts without codable children: Secondary | ICD-10-CM

## 2014-11-20 DIAGNOSIS — D509 Iron deficiency anemia, unspecified: Secondary | ICD-10-CM | POA: Diagnosis present

## 2014-11-20 DIAGNOSIS — Z3689 Encounter for other specified antenatal screening: Secondary | ICD-10-CM | POA: Diagnosis not present

## 2014-11-20 HISTORY — DX: Twin pregnancy, dichorionic/diamniotic, third trimester: O30.043

## 2014-11-20 HISTORY — DX: Iron deficiency anemia, unspecified: D50.9

## 2014-11-20 HISTORY — DX: Acute posthemorrhagic anemia: D62

## 2014-11-20 HISTORY — DX: False labor before 37 completed weeks of gestation, third trimester: O47.03

## 2014-11-20 LAB — TYPE AND SCREEN
ABO/RH(D): A POS
ANTIBODY SCREEN: NEGATIVE

## 2014-11-20 LAB — URINE MICROSCOPIC-ADD ON

## 2014-11-20 LAB — URINALYSIS, ROUTINE W REFLEX MICROSCOPIC
BILIRUBIN URINE: NEGATIVE
Glucose, UA: NEGATIVE mg/dL
KETONES UR: NEGATIVE mg/dL
NITRITE: NEGATIVE
PH: 5.5 (ref 5.0–8.0)
PROTEIN: NEGATIVE mg/dL
Specific Gravity, Urine: >1.03 — ABNORMAL HIGH (ref 1.005–1.030)
Urobilinogen, UA: 0.2 mg/dL (ref 0.0–1.0)

## 2014-11-20 LAB — CBC
HCT: 33.2 % — ABNORMAL LOW (ref 36.0–46.0)
HEMOGLOBIN: 11.6 g/dL — AB (ref 12.0–15.0)
MCH: 33.2 pg (ref 26.0–34.0)
MCHC: 34.9 g/dL (ref 30.0–36.0)
MCV: 95.1 fL (ref 78.0–100.0)
PLATELETS: 171 10*3/uL (ref 150–400)
RBC: 3.49 MIL/uL — AB (ref 3.87–5.11)
RDW: 13.8 % (ref 11.5–15.5)
WBC: 16.7 10*3/uL — AB (ref 4.0–10.5)

## 2014-11-20 MED ORDER — DEXTROSE 5 % IV SOLN
2.0000 g | Freq: Three times a day (TID) | INTRAVENOUS | Status: DC
  Administered 2014-11-20 – 2014-11-24 (×13): 2 g via INTRAVENOUS
  Filled 2014-11-20 (×14): qty 2

## 2014-11-20 MED ORDER — BETAMETHASONE SOD PHOS & ACET 6 (3-3) MG/ML IJ SUSP: 12.0000 mg | Freq: Once | INTRAMUSCULAR | Status: DC

## 2014-11-20 MED ORDER — INDOMETHACIN 25 MG PO CAPS
25.0000 mg | ORAL_CAPSULE | ORAL | Status: AC
  Administered 2014-11-20 – 2014-11-22 (×12): 25 mg via ORAL
  Filled 2014-11-20 (×12): qty 1

## 2014-11-20 MED ORDER — ZOLPIDEM TARTRATE 5 MG PO TABS: 5.0000 mg | ORAL_TABLET | Freq: Every evening | ORAL | Status: DC | PRN

## 2014-11-20 MED ORDER — SODIUM CHLORIDE 0.9 % IV SOLN: 2.0000 g | Freq: Four times a day (QID) | INTRAVENOUS | Status: DC

## 2014-11-20 MED ORDER — INDOMETHACIN 25 MG PO CAPS: 25.0000 mg | ORAL_CAPSULE | Freq: Four times a day (QID) | ORAL | Status: DC

## 2014-11-20 MED ORDER — LACTATED RINGERS IV SOLN
INTRAVENOUS | Status: DC
  Administered 2014-11-20 – 2014-11-21 (×3): via INTRAVENOUS

## 2014-11-20 MED ORDER — PRENATAL MULTIVITAMIN CH
1.0000 | ORAL_TABLET | Freq: Every day | ORAL | Status: DC
  Administered 2014-11-20 – 2014-11-26 (×7): 1 via ORAL
  Filled 2014-11-20 (×9): qty 1

## 2014-11-20 MED ORDER — MAGNESIUM SULFATE 50 % IJ SOLN
2.0000 g/h | INTRAVENOUS | Status: AC
  Administered 2014-11-20: 2 g/h via INTRAVENOUS
  Filled 2014-11-20: qty 80

## 2014-11-20 MED ORDER — INDOMETHACIN 50 MG PO CAPS
50.0000 mg | ORAL_CAPSULE | Freq: Once | ORAL | Status: AC
  Administered 2014-11-20: 50 mg via ORAL
  Filled 2014-11-20: qty 1

## 2014-11-20 MED ORDER — BETAMETHASONE SOD PHOS & ACET 6 (3-3) MG/ML IJ SUSP
12.0000 mg | INTRAMUSCULAR | Status: AC
  Administered 2014-11-20 – 2014-11-21 (×2): 12 mg via INTRAMUSCULAR
  Filled 2014-11-20 (×2): qty 2

## 2014-11-20 MED ORDER — MAGNESIUM SULFATE BOLUS VIA INFUSION
6.0000 g | Freq: Once | INTRAVENOUS | Status: AC
  Administered 2014-11-20: 6 g via INTRAVENOUS
  Filled 2014-11-20: qty 500

## 2014-11-20 MED ORDER — CALCIUM CARBONATE ANTACID 500 MG PO CHEW
2.0000 | CHEWABLE_TABLET | ORAL | Status: DC | PRN
  Filled 2014-11-20: qty 2

## 2014-11-20 MED ORDER — ACETAMINOPHEN 325 MG PO TABS: 650.0000 mg | ORAL_TABLET | ORAL | Status: DC | PRN

## 2014-11-20 MED ORDER — DOCUSATE SODIUM 100 MG PO CAPS
100.0000 mg | ORAL_CAPSULE | Freq: Every day | ORAL | Status: DC
  Administered 2014-11-20 – 2014-11-23 (×4): 100 mg via ORAL
  Filled 2014-11-20 (×9): qty 1

## 2014-11-20 NOTE — MAU Provider Note (Signed)
History   33yo WF G2P1001 presents with inc contractions today. No bleeding. No LOF History of preterm cervical change on Prometrium.  DIDI twin gestation with nl interval growth.   CSN: [id number]  Arrival date and time: 11/20/14 0714  Provider notified: [phone] Provider on unit: 0830 Provider at bedside: [place]  Patient presents with  . Contractions   HPI  [name] is a 34 yo G2P1001 female at [redacted] wks gestation presenting with contractions.  Past Medical History  Diagnosis Date  . Asthma     exercise induced  . GERD (gastroesophageal reflux disease)   . HPV (human papilloma virus) infection   . Scarlet fever     hx of  . Celiac disease   . Dichorionic diamniotic twin pregnancy in third trimester 11/20/2014  . Preterm uterine contractions in third trimester, antepartum 11/20/2014    Past Surgical History  Procedure Laterality Date  . Laparoscopic nissen fundoplication  0938  . Polyp      removal 2009  . Wisdom tooth extraction    . Tympanostomy tube placement    . Cryoablation      Family History  Problem Relation Age of Onset  . Cancer Mother   . CAD Mother   . Hypertension Mother   . CAD Father     History  Substance Use Topics  . Smoking status: Never Smoker   . Smokeless tobacco: Not on file  . Alcohol Use: No    Allergies:  Allergies  Allergen Reactions  . Codeine Nausea And Vomiting  . Gluten Meal   . Other Hives    Cotton seed oil and gluten    Prescriptions prior to admission  Medication Sig Dispense Refill Last Dose  . Prenatal Vit-Fe Fumarate-FA (PRENATAL MULTIVITAMIN) TABS tablet Take 1 tablet by mouth daily at 12 noon.   11/19/2014 at Unknown time  . progesterone (PROMETRIUM) 100 MG capsule Place 100 mg vaginally daily.   11/19/2014 at Unknown time    Review of Systems  Constitutional: Negative.   HENT: Negative.   Eyes: Negative.   Respiratory: Negative.   Cardiovascular: Negative.   Gastrointestinal:  Negative.   Genitourinary:       More intense contractions every 7 mins; (+) FM  Musculoskeletal: Negative.   Skin: Negative.   Neurological: Negative.   Endo/Heme/Allergies: Negative.   Psychiatric/Behavioral: Negative.    CEFM FHR (A): 150 bpm / moderate variability / accels present / no decels FHR (B): 155 bpm / moderate variability / accels present / no decels  TOCO: regular 2-7 mins   Physical Exam   Blood pressure 117/65, pulse 111, temperature 97.9 F (36.6 C), resp. rate 18, height 5\' 9"  (1.753 m), weight 94.983 kg (209 lb 6.4 oz), SpO2 100 %. CBC    Component Value Date/Time   WBC 16.7* 11/20/2014 0939   RBC 3.49* 11/20/2014 0939   HGB 11.6* 11/20/2014 0939   HCT 33.2* 11/20/2014 0939   PLT 171 11/20/2014 0939   MCV 95.1 11/20/2014 0939   MCH 33.2 11/20/2014 0939   MCHC 34.9 11/20/2014 0939   RDW 13.8 11/20/2014 0939   LYMPHSABS 1.8 01/03/2014 1422   MONOABS 0.9 01/03/2014 1422   EOSABS 0.1 01/03/2014 1422   BASOSABS 0.0 01/03/2014 1422      Physical Exam  Constitutional: She is oriented to person, place, and time. She appears well-developed and well-nourished.  HENT:  Head: Normocephalic and atraumatic.  Eyes: Pupils are equal, round, and  reactive to light.  Neck: Normal range of motion.  Cardiovascular: Normal rate, regular rhythm, normal heart sounds and intact distal pulses.   Respiratory: Effort normal and breath sounds normal.  GI: Soft. Bowel sounds are normal.  Genitourinary:  Gravid; non tender; mild UC's by palpation; VE: 3-4/90/-3/vtx  Musculoskeletal: Normal range of motion.  Neurological: She is alert and oriented to person, place, and time. She has normal reflexes.  Skin: Skin is warm and dry.  Psychiatric: She has a normal mood and affect. Her behavior is normal. Judgment and thought content normal.  No CVAT  MAU Course  Procedures CEFM BMZ 12 mg injection x1   Assessment and Plan  34 yo G2P1001 at [redacted] wks gestation Di-Di twin  gestation with concordant growth Preterm Labor UA positive for leuks and WBCs with leukocytosis  Admit to Antenatal Unit MgSO4  neuroprotection NICU consult Sono for position (A-vtx) and EFW GBS cx done in office and neg 7/12 Indomethacin x 48hrs Cefoxitin for UTI

## 2014-11-20 NOTE — Progress Notes (Signed)
Cheryl Lara is a 34 y.o. G2P1001 at [redacted]w[redacted]d by LMP admitted for Preterm labor  Subjective: Comfortable Denies contractions or LOF. Good FM. No CP or SOB.  Objective: BP 107/48 mmHg  Pulse 91  Temp(Src) 97.8 F (36.6 C) (Axillary)  Resp 18  Ht 5\' 9"  (1.753 m)  Wt 94.983 kg (209 lb 6.4 oz)  BMI 30.91 kg/m2  SpO2 100%  Fundus NT VE deferred  Total I/O In: 2305 [P.O.:1100; I.V.:1155; IV Piggyback:50] Out: 27 [Urine:950]  FHT:  FHR: 140-160 bpm, variability: moderate,  accelerations:  Present,  decelerations:  Absent  Category 1 tracing [name]. UC:   None to rare SVE:   Dilation: 3.5 Effacement (%): 90 Station: -2 Exam by:: Dr Cheryl Lara  Labs: Lab Results  Component Value Date   WBC 16.7* 11/20/2014   HGB 11.6* 11/20/2014   HCT 33.2* 11/20/2014   MCV 95.1 11/20/2014   PLT 171 11/20/2014   Sono results pending Verbal report consistent with VTX / Transverse presentation  Assessment / Plan: PTL with advanced dilatation stable  Pos UA DIDI twin gestation  Labor: PTL stable Preeclampsia:  no signs or symptoms of toxicity Fetal Wellbeing:  Category I Pain Control:  na I/D:  n/a Anticipated MOD:  na  Continue Mag neuroprotection x 24 hrs Rescue dose BMZ Mefoxin for pos UA Indocin x 48hrs Continue inpt management Will discuss route of delivery pending sono confirmation  Cheryl Lara J 11/20/2014, 6:45 PM

## 2014-11-20 NOTE — MAU Provider Note (Signed)
ADMISSION HISTORY AND PHYSICAL  History   34yo WF G2P1001 presents with inc contractions today. No bleeding. No LOF History of preterm cervical change on Prometrium.  DIDI twin gestation with nl interval growth.   CSN: [id number]  Arrival date and time: 11/20/14 0714  Provider notified: [phone] Provider on unit: 0830 Provider at bedside: [place]  Patient presents with  . Contractions   HPI  [name] is a 34 yo G2P1001 female at [redacted] wks gestation presenting with contractions.  Past Medical History  Diagnosis Date  . Asthma     exercise induced  . GERD (gastroesophageal reflux disease)   . HPV (human papilloma virus) infection   . Scarlet fever     hx of  . Celiac disease   . Dichorionic diamniotic twin pregnancy in third trimester 11/20/2014  . Preterm uterine contractions in third trimester, antepartum 11/20/2014    Past Surgical History  Procedure Laterality Date  . Laparoscopic nissen fundoplication  5361  . Polyp      removal 2009  . Wisdom tooth extraction    . Tympanostomy tube placement    . Cryoablation      Family History  Problem Relation Age of Onset  . Cancer Mother   . CAD Mother   . Hypertension Mother   . CAD Father     History  Substance Use Topics  . Smoking status: Never Smoker   . Smokeless tobacco: Never Used  . Alcohol Use: No    Allergies:  Allergies  Allergen Reactions  . Codeine Nausea And Vomiting  . Gluten Meal   . Other Hives    Cotton seed oil and gluten    Prescriptions prior to admission  Medication Sig Dispense Refill Last Dose  . Prenatal Vit-Fe Fumarate-FA (PRENATAL MULTIVITAMIN) TABS tablet Take 1 tablet by mouth daily at 12 noon.   11/19/2014 at Unknown time  . progesterone (PROMETRIUM) 100 MG capsule Place 100 mg vaginally daily.   11/19/2014 at Unknown time    Review of Systems  Constitutional: Negative.   HENT: Negative.   Eyes: Negative.   Respiratory: Negative.   Cardiovascular:  Negative.   Gastrointestinal: Negative.   Genitourinary:       More intense contractions every 7 mins; (+) FM  Musculoskeletal: Negative.   Skin: Negative.   Neurological: Negative.   Endo/Heme/Allergies: Negative.   Psychiatric/Behavioral: Negative.    CEFM FHR (A): 150 bpm / moderate variability / accels present / no decels FHR (B): 155 bpm / moderate variability / accels present / no decels  TOCO: regular 2-7 mins   Physical Exam   Blood pressure 117/65, pulse 111, temperature 97.9 F (36.6 C), resp. rate 18, height 5\' 9"  (1.753 m), weight 94.983 kg (209 lb 6.4 oz), SpO2 100 %. CBC    Component Value Date/Time   WBC 16.7* 11/20/2014 0939   RBC 3.49* 11/20/2014 0939   HGB 11.6* 11/20/2014 0939   HCT 33.2* 11/20/2014 0939   PLT 171 11/20/2014 0939   MCV 95.1 11/20/2014 0939   MCH 33.2 11/20/2014 0939   MCHC 34.9 11/20/2014 0939   RDW 13.8 11/20/2014 0939   LYMPHSABS 1.8 01/03/2014 1422   MONOABS 0.9 01/03/2014 1422   EOSABS 0.1 01/03/2014 1422   BASOSABS 0.0 01/03/2014 1422      Physical Exam  Constitutional: She is oriented to person, place, and time. She appears well-developed and well-nourished.  HENT:  Head: Normocephalic and atraumatic.  Eyes: Pupils  are equal, round, and reactive to light.  Neck: Normal range of motion.  Cardiovascular: Normal rate, regular rhythm, normal heart sounds and intact distal pulses.   Respiratory: Effort normal and breath sounds normal.  GI: Soft. Bowel sounds are normal.  Genitourinary:  Gravid; non tender; mild UC's by palpation; VE: 3-4/90/-3/vtx  Musculoskeletal: Normal range of motion.  Neurological: She is alert and oriented to person, place, and time. She has normal reflexes.  Skin: Skin is warm and dry.  Psychiatric: She has a normal mood and affect. Her behavior is normal. Judgment and thought content normal.  No CVAT  MAU Course  Procedures CEFM BMZ 12 mg injection x1   Assessment and Plan  34 yo G2P1001 at [redacted]  wks gestation Di-Di twin gestation with concordant growth Preterm Labor UA positive for leuks and WBCs with leukocytosis  Admit to Antenatal Unit MgSO4  neuroprotection NICU consult Sono for position (A-vtx) and EFW GBS cx done in office and neg 7/12 Indomethacin x 48hrs Cefoxitin for UTI

## 2014-11-20 NOTE — MAU Note (Signed)
Contractions since Thurs. Have gradually gotten closer. Denies LOF or bleeding. Yellow d/c has increased some. On prometrium

## 2014-11-21 MED ORDER — MAGNESIUM SULFATE 50 % IJ SOLN
2.0000 g/h | INTRAVENOUS | Status: AC
  Administered 2014-11-21: 2 g/h via INTRAVENOUS
  Filled 2014-11-21: qty 80

## 2014-11-21 NOTE — Progress Notes (Signed)
NICU requested for consult.

## 2014-11-21 NOTE — Progress Notes (Signed)
Patient ID: Cheryl Lara, female   DOB: 08/08/1980, 34 y.o.   MRN: [id number] HD #2 30 1/7 weeks DIDI twins. PTL.  S: No complaints. No SOB or CP No dysuria. Good FM Denies CTXs or LOF  O: BP 102/48 mmHg  Pulse 94  Temp(Src) 97.7 F (36.5 C) (Oral)  Resp 22  Ht 5\' 9"  (1.753 m)  Wt 94.983 kg (209 lb 6.4 oz)  BMI 30.91 kg/m2  SpO2 100%   NCAT Neck: supple Lungs: CTA CV: RRR Abd: gravid, NT No CVAT Pelvic: deferred Neuro: nonfocal Skin: intact  FHR 140-160 Category 1 tracing twin A and Twin B Accels noted, no decels, rare contractions  UCS pending GBS neg 7/12 Sono 7/30- VTX twin A 1454gms, twin B right transverse lie 1607gms (10% discordance)  A/P 30 1/7 week DIDI twin IUP PTL with advanced dilatation Vtx/ Right transverse presentation- prefers VD. Need for csection if twin B Breech discussed. Pos UA- UCS pending. Continue ABX until UCS done Magnesium neuroprotection done BMZ rescue series complete Continue Indocin x 48 hrs.

## 2014-11-21 NOTE — Plan of Care (Signed)
Problem: Consults Goal: Birthing Suites Patient Information Press F2 to bring up selections list  Outcome: Completed/Met Date Met:  11/21/14  Pt < [redacted] weeks EGA, Multiple Gestation and Antenatal Patient (< 37 weeks) Goal: Neonatologist Consult Dr dees requested.

## 2014-11-22 LAB — URINE CULTURE: Culture: 3000

## 2014-11-22 MED ORDER — SODIUM CHLORIDE 0.9 % IJ SOLN
3.0000 mL | Freq: Two times a day (BID) | INTRAMUSCULAR | Status: DC
  Administered 2014-11-22 – 2014-11-24 (×3): 3 mL via INTRAVENOUS

## 2014-11-22 MED ORDER — SODIUM CHLORIDE 0.9 % IJ SOLN
3.0000 mL | INTRAMUSCULAR | Status: DC | PRN
  Administered 2014-11-23 (×2): 3 mL via INTRAVENOUS
  Filled 2014-11-22 (×2): qty 3

## 2014-11-22 NOTE — Consult Note (Signed)
Neonatology Consult to Antenatal Patient:  I was asked by Dr. Ronita Hipps to see this patient in order to provide antenatal counseling due to preterm labor and twin gestation.  Cheryl Lara was admitted on 7/30 and is now 30 2/[redacted] weeks GA. Di-Di twins, a girl who is vertex and a boy who is currently breech. The babies are slightly discordant, about 10% weight differential. Cheryl Lara presented in preterm labor and is 4 cm dilated and 90% effaced. Labor was stopped with Indomethacin. She is currently not having active labor. She got neuroprotective Magnesium sulfate and got 2 doses of Betamethasone  at 24 weeks, with a third and fourth dose on 7/30-31. She is on bed rest and is being managed expectantly. She is GBS negative.  I spoke with the patient with her husband. We discussed the worst case of delivery in the next 1-2 days, including usual DR management, possible respiratory complications and need for support, IV access, feedings (mother desires breast feeding, which was encouraged), LOS, Mortality and Morbidity, and long term outcomes. Cheryl Lara had several  questions, which I answered. I offered a NICU tour to Cheryl Lara and would be glad to come back if either parent has more questions later.  Thank you for asking me to see this patient.  Real Cons, MD Neonatologist  The total length of face-to-face or floor/unit time for this encounter was 40 minutes. Counseling and/or coordination of care was 30 minutes of the above.

## 2014-11-22 NOTE — Progress Notes (Signed)
Patient ID: GLORIOUS FLICKER, female   DOB: 11-20-1980, 34 y.o.   MRN: [id number] HD #3 30 2/7 weeks DIDI twins. PTL.  S: No complaints. No SOB or CP No dysuria. Good FM Denies CTXs or LOF  O: BP 94/50 mmHg  Pulse 83  Temp(Src) 98.2 F (36.8 C) (Oral)  Resp 18  Ht 5\' 9"  (1.753 m)  Wt 94.983 kg (209 lb 6.4 oz)  BMI 30.91 kg/m2  SpO2 100%   NCAT Neck: supple Lungs: CTA CV: RRR Abd: gravid, NT No CVAT Pelvic: deferred Neuro: nonfocal Skin: intact  FHR 140-160 Category 1 tracing twin A and Twin B Accels noted, no decels, rare contractions  UCS pending GBS neg 7/12 Sono 7/30- VTX twin A 1454gms, twin B right transverse lie 1607gms (10% discordance)  A/P 30 2/7 week DIDI twin IUP PTL with advanced dilatation Vtx/ Right transverse presentation- prefers VD. Need for csection if twin B Breech discussed. Pos UA- UCS pending. Continue ABX until UCS done Magnesium neuroprotection done BMZ rescue series complete Continue Indocin x 48 hrs.

## 2014-11-23 LAB — TYPE AND SCREEN
ABO/RH(D): A POS
Antibody Screen: NEGATIVE

## 2014-11-23 MED ORDER — NIFEDIPINE ER OSMOTIC RELEASE 30 MG PO TB24
30.0000 mg | ORAL_TABLET | Freq: Once | ORAL | Status: AC
  Administered 2014-11-23: 30 mg via ORAL
  Filled 2014-11-23: qty 1

## 2014-11-23 MED ORDER — PROGESTERONE MICRONIZED 200 MG PO CAPS
200.0000 mg | ORAL_CAPSULE | Freq: Every day | ORAL | Status: DC
  Administered 2014-11-23 – 2014-11-26 (×4): 200 mg via VAGINAL
  Filled 2014-11-23 (×4): qty 1

## 2014-11-23 MED ORDER — INDOMETHACIN 25 MG PO CAPS
50.0000 mg | ORAL_CAPSULE | Freq: Once | ORAL | Status: AC
  Administered 2014-11-23: 50 mg via ORAL
  Filled 2014-11-23: qty 2

## 2014-11-23 NOTE — Progress Notes (Signed)
toco applied per pt request for peace of mind

## 2014-11-23 NOTE — Progress Notes (Signed)
Patient ID: Cheryl Lara, female   DOB: 1980-09-28, 34 y.o.   MRN: [id number] No contractions noted NO bleeding. Good FM No LOF. NO CP or SOB. Had one dose Indocin and procardia x 2 today. BP 93/49 mmHg  Pulse 98  Temp(Src) 98.7 F (37.1 C) (Oral)  Resp 18  Ht 5\' 9"  (1.753 m)  Wt 94.983 kg (209 lb 6.4 oz)  BMI 30.91 kg/m2  SpO2 100%  FHR reactive x 2 Rare contractions noted. Continue present care.

## 2014-11-23 NOTE — Progress Notes (Signed)
Patient ID: Cheryl Lara, female   DOB: [birth year], 34 y.o.   MRN: [id number] HD #4 30 3/7 weeks DIDI twins. PTL.  S: Feels crampy today. Slight blood tinged mucus this am. No SOB or CP No dysuria. Good FM Denies CTXs or LOF  O: BP 99/56 mmHg  Pulse 79  Temp(Src) 97.9 F (36.6 C) (Oral)  Resp 20  Ht 5\' 9"  (1.753 m)  Wt 94.983 kg (209 lb 6.4 oz)  BMI 30.91 kg/m2  SpO2 100%   NCAT Neck: supple Lungs: CTA CV: RRR Abd: gravid, NT No CVAT Pelvic: deferred Neuro: nonfocal Skin: intact  FHR 140-160 Category 1 tracing twin A and Twin B Accels noted, no decels, rare contractions  UCS neg GBS neg 7/12 Sono 7/30- VTX twin A 1454gms, twin B right transverse lie 1607gms (10% discordance)  A/P 30 3/7 week DIDI twin IUP PTL with advanced dilatation Vtx/ Breech by Leopolds today Pos UA- UCS neg, Abx dc'ed Magnesium neuroprotection done BMZ rescue series complete Restart vaginal prometrium Procardia XL x one now

## 2014-11-24 MED ORDER — INDOMETHACIN 50 MG PO CAPS
50.0000 mg | ORAL_CAPSULE | Freq: Once | ORAL | Status: AC
  Administered 2014-11-24: 50 mg via ORAL
  Filled 2014-11-24: qty 1

## 2014-11-24 MED ORDER — INDOMETHACIN 25 MG PO CAPS
25.0000 mg | ORAL_CAPSULE | Freq: Three times a day (TID) | ORAL | Status: DC
  Administered 2014-11-24 – 2014-11-26 (×6): 25 mg via ORAL
  Filled 2014-11-24 (×9): qty 1

## 2014-11-24 MED ORDER — NIFEDIPINE ER 30 MG PO TB24
30.0000 mg | ORAL_TABLET | Freq: Every day | ORAL | Status: DC
  Administered 2014-11-24 – 2014-11-26 (×3): 30 mg via ORAL
  Filled 2014-11-24 (×4): qty 1

## 2014-11-24 NOTE — Progress Notes (Signed)
Patient ID: Cheryl Lara, female   DOB: Sep 14, 1980, 35 y.o.   MRN: [id number] HD #5 30 4/7 weeks DIDI twins. PTL.  S: Cramping improved after Procardia and Indocin x one Occ contractions but no regular activity Occ bloody mucus with unination No SOB or CP No dysuria. Good FM Denies CTXs or LOF  O: BP 93/49 mmHg  Pulse 98  Temp(Src) 98.7 F (37.1 C) (Oral)  Resp 18  Ht 5\' 9"  (1.753 m)  Wt 94.983 kg (209 lb 6.4 oz)  BMI 30.91 kg/m2  SpO2 100%   NCAT Neck: supple Lungs: CTA CV: RRR Abd: gravid, NT No CVAT Pelvic: deferred Neuro: nonfocal Skin: intact  FHR 140-160 Category 1 tracing twin A and Twin B Accels noted, no decels, rare contractions  UCS neg GBS neg 7/12 Sono 7/30- VTX twin A 1454gms, twin B right transverse lie 1607gms (10% discordance)  A/P 30 4/7 week DIDI twin IUP PTL with advanced dilatation Vtx/ Breech by Leopolds today- Csection if labors Magnesium neuroprotection done BMZ rescue series complete Restart vaginal prometrium Procardia XL daily

## 2014-11-25 MED ORDER — LACTATED RINGERS IV SOLN
INTRAVENOUS | Status: DC
  Administered 2014-11-25 – 2014-11-27 (×6): via INTRAVENOUS

## 2014-11-25 NOTE — Progress Notes (Signed)
Patient ID: Cheryl Lara, female   DOB: [birth year], 34 y.o.   MRN: [id number] HD #6 30 5/7 weeks DIDI twins. PTL.  S: Cramping improved after Procardia and Indocin  Occ contractions but no regular activity Occ bloody mucus now resolved today No SOB or CP No dysuria. Good FM Denies CTXs or LOF  O: BP 104/48 mmHg   Pulse 91   Temp(Src) 98.4 F (36.9 C) (Oral)   Resp 20   Ht 5\' 9"  (1.753 m)   Wt 97.115 kg (214 lb 1.6 oz)   BMI 31.60 kg/m2   SpO2 100%   NCAT Neck: supple Lungs: CTA CV: RRR Abd: gravid, NT No CVAT Pelvic: deferred Neuro: nonfocal Skin: intact  FHR 140-160 Category 1 tracing twin A and Twin B Accels noted,  rare contractions Occ mild variable twin kA  UCS neg GBS neg 7/12 Sono 7/30- VTX twin A 1454gms, twin B right transverse lie 1607gms (10% discordance)  A/P 30 5/7 week DIDI twin IUP PTL with advanced dilatation Vtx/ Breech by Leopolds today- Csection if labors Magnesium neuroprotection done BMZ rescue series complete vaginal prometrium Procardia XL daily Continue Indocin x 72 hours Sono for BPP and AFI due to prolonged Indocin use

## 2014-11-26 ENCOUNTER — Inpatient Hospital Stay (HOSPITAL_COMMUNITY): Payer: No Typology Code available for payment source

## 2014-11-26 LAB — TYPE AND SCREEN
ABO/RH(D): A POS
Antibody Screen: NEGATIVE

## 2014-11-26 NOTE — Progress Notes (Signed)
Patient ID: Cheryl Lara, female   DOB: 23-Feb-1981, 34 y.o.   MRN: [id number] HD #7 30 6/7 weeks DIDI twins. PTL.  S: Cramping improved after Procardia and Indocin  Occ contractions but no regular activity Occ bloody mucus now resolved today No SOB or CP No dysuria. Good FM Denies CTXs or LOF  O: BP 94/45 mmHg  Pulse 100  Temp(Src) 98.8 F (37.1 C) (Oral)  Resp 20  Ht 5\' 9"  (1.753 m)  Wt 97.115 kg (214 lb 1.6 oz)  BMI 31.60 kg/m2  SpO2 100%   NCAT Neck: supple Lungs: CTA CV: RRR Abd: gravid, NT No CVAT Pelvic: deferred Neuro: nonfocal Skin: intact  FHR 140-160 Category 1 tracing twin A and Twin B Accels noted,  rare contractions Occ mild variable twin A  UCS neg GBS neg 7/12 Sono 7/30- VTX twin A 1454gms, twin B right transverse lie 1607gms (10% discordance)  A/P 30 6/7 week DIDI twin IUP PTL with advanced dilatation Vtx/ Breech by Leopolds today- Csection if labors Magnesium neuroprotection done BMZ rescue series complete vaginal prometrium Procardia XL daily Continue Indocin x 72 hours Sono for BPP and AFI due to prolonged Indocin use pending today

## 2014-11-26 NOTE — Progress Notes (Signed)
Pt getting a massage from her therapist, no complaints

## 2014-11-26 NOTE — Progress Notes (Signed)
Patient ID: Cheryl Lara, female   DOB: June 08, 1980, 34 y.o.   MRN: [id number] BPP 6/8 A , vertex, subjectively low normal AFI, (-2 Breathing) BPP 8/8 B, Breech, subjectively low normal AFI. Rpt BPP A tomorrow DC Indocin.

## 2014-11-27 ENCOUNTER — Encounter (HOSPITAL_COMMUNITY)
Admission: AD | Disposition: A | Discharge status: Home / Self Care | Payer: Self-pay | Source: Ambulatory Visit | Attending: Obstetrics and Gynecology

## 2014-11-27 ENCOUNTER — Encounter (HOSPITAL_COMMUNITY): Payer: Self-pay | Admitting: Anesthesiology

## 2014-11-27 ENCOUNTER — Inpatient Hospital Stay (HOSPITAL_COMMUNITY): Payer: No Typology Code available for payment source | Admitting: Anesthesiology

## 2014-11-27 DIAGNOSIS — D509 Iron deficiency anemia, unspecified: Secondary | ICD-10-CM

## 2014-11-27 HISTORY — DX: Iron deficiency anemia, unspecified: D50.9

## 2014-11-27 LAB — CBC
HCT: 30.7 % — ABNORMAL LOW (ref 36.0–46.0)
HCT: 29.1 % — ABNORMAL LOW (ref 36.0–46.0)
HEMOGLOBIN: 9.7 g/dL — AB (ref 12.0–15.0)
Hemoglobin: 10.3 g/dL — ABNORMAL LOW (ref 12.0–15.0)
MCH: 32.2 pg (ref 26.0–34.0)
MCH: 32.1 pg (ref 26.0–34.0)
MCHC: 33.6 g/dL (ref 30.0–36.0)
MCHC: 33.3 g/dL (ref 30.0–36.0)
MCV: 95.9 fL (ref 78.0–100.0)
MCV: 96.4 fL (ref 78.0–100.0)
PLATELETS: 180 10*3/uL (ref 150–400)
PLATELETS: 181 10*3/uL (ref 150–400)
RBC: 3.2 MIL/uL — ABNORMAL LOW (ref 3.87–5.11)
RBC: 3.02 MIL/uL — ABNORMAL LOW (ref 3.87–5.11)
RDW: 13.7 % (ref 11.5–15.5)
RDW: 13.7 % (ref 11.5–15.5)
WBC: 21.3 10*3/uL — ABNORMAL HIGH (ref 4.0–10.5)
WBC: 19 10*3/uL — AB (ref 4.0–10.5)

## 2014-11-27 SURGERY — Surgical Case
Anesthesia: Spinal | Site: Abdomen

## 2014-11-27 MED ORDER — PHENYLEPHRINE HCL 10 MG/ML IJ SOLN
INTRAMUSCULAR | Status: DC | PRN
  Administered 2014-11-27 (×3): 40 ug via INTRAVENOUS
  Administered 2014-11-27 (×2): 80 ug via INTRAVENOUS
  Administered 2014-11-27 (×3): 40 ug via INTRAVENOUS

## 2014-11-27 MED ORDER — OXYTOCIN 10 UNIT/ML IJ SOLN
INTRAMUSCULAR | Status: AC
  Filled 2014-11-27: qty 4

## 2014-11-27 MED ORDER — MENTHOL 3 MG MT LOZG: 1.0000 | LOZENGE | OROMUCOSAL | Status: DC | PRN

## 2014-11-27 MED ORDER — SENNOSIDES-DOCUSATE SODIUM 8.6-50 MG PO TABS
2.0000 | ORAL_TABLET | ORAL | Status: DC
  Administered 2014-11-28 (×2): 2 via ORAL
  Filled 2014-11-27 (×2): qty 2

## 2014-11-27 MED ORDER — MAGNESIUM OXIDE 400 (241.3 MG) MG PO TABS
200.0000 mg | ORAL_TABLET | Freq: Every day | ORAL | Status: DC
  Administered 2014-11-28: 200 mg via ORAL
  Filled 2014-11-27 (×3): qty 0.5

## 2014-11-27 MED ORDER — HYDROMORPHONE 0.3 MG/ML IV SOLN
INTRAVENOUS | Status: DC
  Administered 2014-11-27: 0.4 mg via INTRAVENOUS
  Administered 2014-11-27: 1.39 mg via INTRAVENOUS
  Administered 2014-11-27: 2.49 mg via INTRAVENOUS
  Administered 2014-11-27: 07:00:00 via INTRAVENOUS
  Filled 2014-11-27: qty 25

## 2014-11-27 MED ORDER — ONDANSETRON HCL 4 MG/2ML IJ SOLN
INTRAMUSCULAR | Status: AC
  Filled 2014-11-27: qty 2

## 2014-11-27 MED ORDER — CEFAZOLIN SODIUM-DEXTROSE 2-3 GM-% IV SOLR
INTRAVENOUS | Status: DC | PRN
  Administered 2014-11-27: 2 g via INTRAVENOUS

## 2014-11-27 MED ORDER — PRENATAL MULTIVITAMIN CH
1.0000 | ORAL_TABLET | Freq: Every day | ORAL | Status: DC
  Administered 2014-11-27 – 2014-11-30 (×4): 1 via ORAL
  Filled 2014-11-27 (×4): qty 1

## 2014-11-27 MED ORDER — DIBUCAINE 1 % RE OINT: 1.0000 "application " | TOPICAL_OINTMENT | RECTAL | Status: DC | PRN

## 2014-11-27 MED ORDER — ACETAMINOPHEN 325 MG PO TABS: 650.0000 mg | ORAL_TABLET | ORAL | Status: DC | PRN

## 2014-11-27 MED ORDER — PHENYLEPHRINE 40 MCG/ML (10ML) SYRINGE FOR IV PUSH (FOR BLOOD PRESSURE SUPPORT)
PREFILLED_SYRINGE | INTRAVENOUS | Status: AC
  Filled 2014-11-27: qty 10

## 2014-11-27 MED ORDER — KETOROLAC TROMETHAMINE 30 MG/ML IJ SOLN
INTRAMUSCULAR | Status: AC
  Administered 2014-11-27: 30 mg via INTRAVENOUS
  Filled 2014-11-27: qty 1

## 2014-11-27 MED ORDER — BUPIVACAINE LIPOSOME 1.3 % IJ SUSP
20.0000 mL | Freq: Once | INTRAMUSCULAR | Status: DC
  Filled 2014-11-27: qty 20

## 2014-11-27 MED ORDER — FENTANYL CITRATE (PF) 100 MCG/2ML IJ SOLN
25.0000 ug | INTRAMUSCULAR | Status: DC | PRN
  Administered 2014-11-27 (×2): 50 ug via INTRAVENOUS

## 2014-11-27 MED ORDER — LANOLIN HYDROUS EX OINT: 1.0000 "application " | TOPICAL_OINTMENT | CUTANEOUS | Status: DC | PRN

## 2014-11-27 MED ORDER — OXYTOCIN 10 UNIT/ML IJ SOLN
INTRAMUSCULAR | Status: DC | PRN
  Administered 2014-11-27: 40 [IU] via INTRAMUSCULAR

## 2014-11-27 MED ORDER — SIMETHICONE 80 MG PO CHEW: 80.0000 mg | CHEWABLE_TABLET | ORAL | Status: DC | PRN

## 2014-11-27 MED ORDER — DIPHENHYDRAMINE HCL 50 MG/ML IJ SOLN: 12.5000 mg | Freq: Four times a day (QID) | INTRAMUSCULAR | Status: DC | PRN

## 2014-11-27 MED ORDER — IBUPROFEN 600 MG PO TABS
600.0000 mg | ORAL_TABLET | Freq: Four times a day (QID) | ORAL | Status: DC
  Administered 2014-11-27 – 2014-11-29 (×8): 600 mg via ORAL
  Filled 2014-11-27 (×8): qty 1

## 2014-11-27 MED ORDER — CITRIC ACID-SODIUM CITRATE 334-500 MG/5ML PO SOLN
30.0000 mL | Freq: Once | ORAL | Status: AC
  Administered 2014-11-27: 30 mL via ORAL

## 2014-11-27 MED ORDER — KETOROLAC TROMETHAMINE 30 MG/ML IJ SOLN: 30.0000 mg | Freq: Once | INTRAMUSCULAR | Status: DC

## 2014-11-27 MED ORDER — PROMETHAZINE HCL 25 MG/ML IJ SOLN: 6.2500 mg | INTRAMUSCULAR | Status: DC | PRN

## 2014-11-27 MED ORDER — WITCH HAZEL-GLYCERIN EX PADS: 1.0000 "application " | MEDICATED_PAD | CUTANEOUS | Status: DC | PRN

## 2014-11-27 MED ORDER — SODIUM CHLORIDE 0.9 % IJ SOLN: 9.0000 mL | INTRAMUSCULAR | Status: DC | PRN

## 2014-11-27 MED ORDER — CITRIC ACID-SODIUM CITRATE 334-500 MG/5ML PO SOLN
ORAL | Status: AC
  Administered 2014-11-27: 30 mL via ORAL
  Filled 2014-11-27: qty 15

## 2014-11-27 MED ORDER — BUPIVACAINE LIPOSOME 1.3 % IJ SUSP
Freq: Once | INTRAMUSCULAR | Status: DC
  Filled 2014-11-27: qty 20

## 2014-11-27 MED ORDER — LACTATED RINGERS IV SOLN: INTRAVENOUS | Status: DC

## 2014-11-27 MED ORDER — MEPERIDINE HCL 25 MG/ML IJ SOLN: 6.2500 mg | INTRAMUSCULAR | Status: DC | PRN

## 2014-11-27 MED ORDER — MEPERIDINE HCL 25 MG/ML IJ SOLN
INTRAMUSCULAR | Status: DC | PRN
  Administered 2014-11-27 (×2): 12.5 mg via INTRAVENOUS

## 2014-11-27 MED ORDER — OXYCODONE-ACETAMINOPHEN 5-325 MG PO TABS
2.0000 | ORAL_TABLET | ORAL | Status: DC | PRN
Start: 2014-11-27 — End: 2014-11-30
  Administered 2014-11-28 – 2014-11-29 (×6): 2 via ORAL
  Filled 2014-11-27 (×6): qty 2

## 2014-11-27 MED ORDER — TETANUS-DIPHTH-ACELL PERTUSSIS 5-2.5-18.5 LF-MCG/0.5 IM SUSP: 0.5000 mL | Freq: Once | INTRAMUSCULAR | Status: DC

## 2014-11-27 MED ORDER — ONDANSETRON HCL 4 MG/2ML IJ SOLN: 4.0000 mg | Freq: Four times a day (QID) | INTRAMUSCULAR | Status: DC | PRN

## 2014-11-27 MED ORDER — BUPIVACAINE IN DEXTROSE 0.75-8.25 % IT SOLN
INTRATHECAL | Status: DC | PRN
Start: 2014-11-27 — End: 2014-11-27
  Administered 2014-11-27: 1.5 mL via INTRATHECAL

## 2014-11-27 MED ORDER — FENTANYL CITRATE (PF) 100 MCG/2ML IJ SOLN
INTRAMUSCULAR | Status: AC
  Filled 2014-11-27: qty 2

## 2014-11-27 MED ORDER — NALOXONE HCL 0.4 MG/ML IJ SOLN: 0.4000 mg | INTRAMUSCULAR | Status: DC | PRN

## 2014-11-27 MED ORDER — MEPERIDINE HCL 25 MG/ML IJ SOLN
INTRAMUSCULAR | Status: AC
  Filled 2014-11-27: qty 1

## 2014-11-27 MED ORDER — SIMETHICONE 80 MG PO CHEW
80.0000 mg | CHEWABLE_TABLET | ORAL | Status: DC
  Administered 2014-11-28 – 2014-11-29 (×3): 80 mg via ORAL
  Filled 2014-11-27 (×3): qty 1

## 2014-11-27 MED ORDER — BUPIVACAINE LIPOSOME 1.3 % IJ SUSP
INTRAMUSCULAR | Status: DC | PRN
  Administered 2014-11-27: 04:00:00

## 2014-11-27 MED ORDER — SIMETHICONE 80 MG PO CHEW
80.0000 mg | CHEWABLE_TABLET | Freq: Three times a day (TID) | ORAL | Status: DC
  Administered 2014-11-27 – 2014-11-28 (×5): 80 mg via ORAL
  Filled 2014-11-27 (×6): qty 1

## 2014-11-27 MED ORDER — METHYLERGONOVINE MALEATE 0.2 MG/ML IJ SOLN: 0.2000 mg | INTRAMUSCULAR | Status: DC | PRN

## 2014-11-27 MED ORDER — OXYTOCIN 40 UNITS IN LACTATED RINGERS INFUSION - SIMPLE MED: 62.5000 mL/h | INTRAVENOUS | Status: AC

## 2014-11-27 MED ORDER — LACTATED RINGERS IV SOLN
INTRAVENOUS | Status: DC | PRN
  Administered 2014-11-27 (×2): via INTRAVENOUS

## 2014-11-27 MED ORDER — EPHEDRINE 5 MG/ML INJ
INTRAVENOUS | Status: AC
  Filled 2014-11-27: qty 10

## 2014-11-27 MED ORDER — LACTATED RINGERS IV SOLN
INTRAVENOUS | Status: DC
  Administered 2014-11-27: 10:00:00 via INTRAVENOUS

## 2014-11-27 MED ORDER — OXYCODONE-ACETAMINOPHEN 5-325 MG PO TABS
1.0000 | ORAL_TABLET | ORAL | Status: DC | PRN
  Administered 2014-11-27 – 2014-11-30 (×8): 1 via ORAL
  Filled 2014-11-27 (×8): qty 1

## 2014-11-27 MED ORDER — METHYLERGONOVINE MALEATE 0.2 MG PO TABS
0.2000 mg | ORAL_TABLET | ORAL | Status: DC | PRN
Start: 2014-11-27 — End: 2014-11-29

## 2014-11-27 MED ORDER — ONDANSETRON HCL 4 MG/2ML IJ SOLN
INTRAMUSCULAR | Status: DC | PRN
  Administered 2014-11-27: 4 mg via INTRAVENOUS

## 2014-11-27 MED ORDER — ZOLPIDEM TARTRATE 5 MG PO TABS: 5.0000 mg | ORAL_TABLET | Freq: Every evening | ORAL | Status: DC | PRN

## 2014-11-27 MED ORDER — CEFAZOLIN SODIUM-DEXTROSE 2-3 GM-% IV SOLR
INTRAVENOUS | Status: AC
  Filled 2014-11-27: qty 50

## 2014-11-27 MED ORDER — PHENYLEPHRINE 8 MG IN D5W 100 ML (0.08MG/ML) PREMIX OPTIME
INJECTION | INTRAVENOUS | Status: AC
  Filled 2014-11-27: qty 100

## 2014-11-27 MED ORDER — POLYSACCHARIDE IRON COMPLEX 150 MG PO CAPS
150.0000 mg | ORAL_CAPSULE | Freq: Every day | ORAL | Status: DC
  Administered 2014-11-28 – 2014-11-30 (×3): 150 mg via ORAL
  Filled 2014-11-27 (×5): qty 1

## 2014-11-27 MED ORDER — DIPHENHYDRAMINE HCL 25 MG PO CAPS: 25.0000 mg | ORAL_CAPSULE | Freq: Four times a day (QID) | ORAL | Status: DC | PRN

## 2014-11-27 MED ORDER — BUPIVACAINE HCL (PF) 0.25 % IJ SOLN
INTRAMUSCULAR | Status: AC
  Filled 2014-11-27: qty 20

## 2014-11-27 MED ORDER — SCOPOLAMINE 1 MG/3DAYS TD PT72
MEDICATED_PATCH | TRANSDERMAL | Status: AC
  Filled 2014-11-27: qty 1

## 2014-11-27 MED ORDER — DIPHENHYDRAMINE HCL 12.5 MG/5ML PO ELIX: 12.5000 mg | ORAL_SOLUTION | Freq: Four times a day (QID) | ORAL | Status: DC | PRN

## 2014-11-27 SURGICAL SUPPLY — 30 items
CLAMP CORD UMBIL (MISCELLANEOUS) IMPLANT
CLOTH BEACON ORANGE TIMEOUT ST (SAFETY) ×2 IMPLANT
CONTAINER PREFILL 10% NBF 15ML (MISCELLANEOUS) IMPLANT
DRAPE SHEET LG 3/4 BI-LAMINATE (DRAPES) IMPLANT
DRSG OPSITE POSTOP 4X10 (GAUZE/BANDAGES/DRESSINGS) ×2 IMPLANT
DURAPREP 26ML APPLICATOR (WOUND CARE) ×2 IMPLANT
ELECT REM PT RETURN 9FT ADLT (ELECTROSURGICAL) ×2
ELECTRODE REM PT RTRN 9FT ADLT (ELECTROSURGICAL) ×1 IMPLANT
EXTRACTOR VACUUM M CUP 4 TUBE (SUCTIONS) IMPLANT
GLOVE BIO SURGEON STRL SZ7.5 (GLOVE) ×2 IMPLANT
GOWN STRL REUS W/TWL LRG LVL3 (GOWN DISPOSABLE) ×4 IMPLANT
KIT ABG SYR 3ML LUER SLIP (SYRINGE) IMPLANT
NEEDLE HYPO 22GX1.5 SAFETY (NEEDLE) ×4 IMPLANT
NEEDLE HYPO 25X5/8 SAFETYGLIDE (NEEDLE) IMPLANT
NEEDLE SPNL 20GX3.5 QUINCKE YW (NEEDLE) IMPLANT
NS IRRIG 1000ML POUR BTL (IV SOLUTION) ×2 IMPLANT
PACK C SECTION WH (CUSTOM PROCEDURE TRAY) ×2 IMPLANT
SUT MNCRL 0 VIOLET CTX 36 (SUTURE) ×3 IMPLANT
SUT MNCRL AB 3-0 PS2 27 (SUTURE) IMPLANT
SUT MON AB 2-0 CT1 27 (SUTURE) ×2 IMPLANT
SUT MON AB-0 CT1 36 (SUTURE) ×4 IMPLANT
SUT MONOCRYL 0 CTX 36 (SUTURE) ×3
SUT PLAIN 0 NONE (SUTURE) IMPLANT
SUT PLAIN 2 0 (SUTURE) ×1
SUT PLAIN 2 0 XLH (SUTURE) IMPLANT
SUT PLAIN ABS 2-0 CT1 27XMFL (SUTURE) ×1 IMPLANT
SYR 20CC LL (SYRINGE) ×2 IMPLANT
SYR CONTROL 10ML LL (SYRINGE) ×2 IMPLANT
TOWEL OR 17X24 6PK STRL BLUE (TOWEL DISPOSABLE) ×2 IMPLANT
TRAY FOLEY CATH SILVER 14FR (SET/KITS/TRAYS/PACK) ×2 IMPLANT

## 2014-11-27 NOTE — Anesthesia Preprocedure Evaluation (Signed)
Anesthesia Evaluation  Patient identified by MRN, date of birth, ID band Patient awake    Reviewed: Allergy & Precautions, NPO status , Patient's Chart, lab work & pertinent test results  Airway Mallampati: II  TM Distance: >3 FB Neck ROM: Full    Dental no notable dental hx. (+) Caps   Pulmonary asthma ,  breath sounds clear to auscultation  Pulmonary exam normal       Cardiovascular negative cardio ROS Normal cardiovascular examRhythm:Regular Rate:Normal     Neuro/Psych negative neurological ROS  negative psych ROS   GI/Hepatic negative GI ROS, Neg liver ROS,   Endo/Other  negative endocrine ROS  Renal/GU negative Renal ROS  negative genitourinary   Musculoskeletal negative musculoskeletal ROS (+)   Abdominal   Peds negative pediatric ROS (+)  Hematology negative hematology ROS (+)   Anesthesia Other Findings   Reproductive/Obstetrics (+) Pregnancy                             Anesthesia Physical Anesthesia Plan  ASA: II and emergent  Anesthesia Plan: Spinal   Post-op Pain Management:    Induction:   Airway Management Planned: Natural Airway  Additional Equipment:   Intra-op Plan:   Post-operative Plan:   Informed Consent: I have reviewed the patients History and Physical, chart, labs and discussed the procedure including the risks, benefits and alternatives for the proposed anesthesia with the patient or authorized representative who has indicated his/her understanding and acceptance.   Dental advisory given  Plan Discussed with: CRNA  Anesthesia Plan Comments:         Anesthesia Quick Evaluation

## 2014-11-27 NOTE — Anesthesia Postprocedure Evaluation (Signed)
  Anesthesia Post-op Note  Patient: Cheryl Lara  Procedure(s) Performed: Procedure(s): CESAREAN SECTION (N/A)  Patient Location: [facility]  Anesthesia Type:Spinal  Level of Consciousness: awake and alert   Airway and Oxygen Therapy: Patient Spontanous Breathing and Patient connected to nasal cannula oxygen  Post-op Pain: mild  Post-op Assessment: Post-op Vital signs reviewed, Patient's Cardiovascular Status Stable, Respiratory Function Stable, No signs of Nausea or vomiting, Pain level controlled, No headache, Spinal receding and Patient able to bend at knees  Post-op Vital Signs: Reviewed  Last Vitals:  Filed Vitals:   11/27/14 0730  BP: 122/65  Pulse: 95  Temp: 37.8 C  Resp: 19    Complications: No apparent anesthesia complications

## 2014-11-27 NOTE — Addendum Note (Signed)
Addendum  created 11/27/14 0827 by Garner Nash, CRNA   Modules edited: Notes Section   Notes Section:  File: [id number]

## 2014-11-27 NOTE — Op Note (Signed)
Cesarean Section Procedure Note  Indications: malpresentation: twin B and preterm labor  Pre-operative Diagnosis: 31 week 0 day pregnancy.  Post-operative Diagnosis: same  Surgeon: Lovenia Kim   Assistants: Renato Battles, CNM  Anesthesia: Local anesthesia 0.25.% bupivacaine and Spinal anesthesia  ASA Class: 2  Procedure Details  The patient was seen in the Holding Room. The risks, benefits, complications, treatment options, and expected outcomes were discussed with the patient.  The patient concurred with the proposed plan, giving informed consent. The risks of anesthesia, infection, bleeding and possible injury to other organs discussed. Injury to bowel, bladder, or ureter with possible need for repair discussed. Possible need for transfusion with secondary risks of hepatitis or HIV acquisition discussed. Post operative complications to include but not limited to DVT, PE and Pneumonia noted. The site of surgery properly noted/marked. The patient was taken to Operating Room # 2, identified as Cheryl Lara and the procedure verified as C-Section Delivery. A Time Out was held and the above information confirmed.  After induction of anesthesia, the patient was draped and prepped in the usual sterile manner. A Pfannenstiel incision was made and carried down through the subcutaneous tissue to the fascia. Fascial incision was made and extended transversely using Mayo scissors. The fascia was separated from the underlying rectus tissue superiorly and inferiorly. The peritoneum was identified and entered. Peritoneal incision was extended longitudinally. The utero-vesical peritoneal reflection was incised transversely and the bladder flap was bluntly freed from the lower uterine segment. A low transverse uterine incision(Kerr hysterotomy) was made. Delivered from vertex presentation was a  Female twin A with Apgar scores of 8 at one minute and 8 at five minutes.Delivered from footling breech presentation  was a  Female twin B with Apgar scores of 8 at one minute and 8 at five minutesBulb suctioning gently performed. Neonatal team in attendance.After the umbilical cord was clamped and cut cord blood was obtained for evaluation. The placenta was removed intact and appeared normal. The uterus was curetted with a dry lap pack. Good hemostasis was noted.The uterine outline, tubes and ovaries appeared normal. The uterine incision was closed with running locked sutures of 0 Monocryl x 2 layers. Hemostasis was observed. Lavage was carried out until clear.The parietal peritoneum was closed with a running 2-0 Monocryl suture. The fascia was then reapproximated with running sutures of 0 Monocryl. The skin was reapproximated with 3-0 monocryl after Vonore closure with 2-0 plain.  Instrument, sponge, and needle counts were correct prior the abdominal closure and at the conclusion of the case.   Findings: As noted above. Babies to NICU  Estimated Blood Loss:  450         Drains: foley                 Specimens: placenta                 Complications:  None; patient tolerated the procedure well.         Disposition: PACU - hemodynamically stable.         Condition: stable  Attending Attestation: I performed the procedure.

## 2014-11-27 NOTE — Progress Notes (Signed)
Patient ID: OYINKANSOLA TRUAX, female   DOB: 1981-02-01, 34 y.o.   MRN: [id number] Subjective: S/P Cesarean Delivery of Dichorionic - Diamniotic Twins / Preterm Labor  POD# 0  Information for the patient's newborn:  Skiler, Tye [[id number]]  female Information for the patient's newborn:  Jeanae, Whitmill [[id number]]  female  / circ in NICU  Reports feeling very tired and sore Feeding: infants in NICU - RN setting mom up with breast pump Patient reports tolerating PO.  Breast symptoms: pumping breast milk Pain controlled with narcotic analgesics including Dilaudid PCA Denies HA/SOB/C/P/N/V/dizziness. Flatus none. No BM. She reports vaginal bleeding as normal, without clots.  She is ambulating, urinating without difficult.     Objective:   VS:  Filed Vitals:   11/27/14 0624 11/27/14 0730 11/27/14 0830 11/27/14 0928  BP: 113/64 122/65 119/63   Pulse: 86 95 100 96  Temp: 99.2 F (37.3 C) 100.1 F (37.8 C) 99.1 F (37.3 C)   TempSrc:  Oral Axillary   Resp: 14 19 18 13   Height:      Weight:      SpO2: 99% 97% 97% 97%     Intake/Output Summary (Last 24 hours) at 11/27/14 0932 Last data filed at 11/27/14 0530  Gross per 24 hour  Intake   2920 ml  Output    900 ml  Net   2020 ml        Recent Labs  11/27/14 0255 11/27/14 0800  WBC 21.3* 19.0*  HGB 10.3* 9.7*  HCT 30.7* 29.1*  PLT 180 181     Blood type: A POS (08/05 0854)  Rubella: Immune (03/14 0000)     Physical Exam:  General: alert, cooperative, fatigued and no distress CV: Regular rate and rhythm, S1S2 present or without murmur or extra heart sounds Resp: clear Abdomen: soft, nontender, normal bowel sounds Incision: serous drainage present Uterine Fundus: firm, below umbilicus, appropriately postoperatively tender Lochia: moderate Ext: extremities normal, atraumatic, no cyanosis or edema and Homans sign is negative, no sign of DVT  Assessment/Plan: 34 y.o.   POD# 0.  s/p Cesarean Delivery.  Indications:  preterm labor / Di-Di twins / malpresentation of twin B                Principal Problem:   Postpartum care following cesarean delivery of Di-Di twins (8/6) Active Problems:   Acute blood loss anemia   Dichorionic diamniotic twin pregnancy in third trimester   Preterm uterine contractions in third trimester, antepartum   Twins   [redacted] weeks gestation of pregnancy   Encounter for fetal anatomic survey   Iron deficiency anemia  Doing well, stable.               Advance diet as tolerated D/C foley and IV per unit protocol Start Niferex 150 mg daily Start Magnesium Oxide 200 mg daily Ambulate Routine post-op care  Graceann Congress, MSN, CNM 11/27/2014, 9:32 AM

## 2014-11-27 NOTE — Progress Notes (Signed)
Patient ID: Cheryl Lara, female   DOB: 11-02-1980, 34 y.o.   MRN: [id number] Patient seen and examined. Consent witnessed and signed. No changes noted. Update completed. VE: 6cm/100/0 Proceed with csection

## 2014-11-27 NOTE — Anesthesia Procedure Notes (Signed)
Spinal Patient location during procedure: OR Staffing Anesthesiologist: Montez Hageman Performed by: anesthesiologist  Preanesthetic Checklist Completed: patient identified, site marked, surgical consent, pre-op evaluation, timeout performed, IV checked, risks and benefits discussed and monitors and equipment checked Spinal Block Patient position: sitting Prep: ChloraPrep Patient monitoring: heart rate, continuous pulse ox and blood pressure Approach: right paramedian Location: L4-5 Injection technique: single-shot Needle Needle type: Sprotte  Needle gauge: 24 G Needle length: 9 cm Assessment Sensory level: T6 Additional Notes Expiration date of kit checked and confirmed. Patient tolerated procedure well, without complications.

## 2014-11-27 NOTE — Lactation Note (Signed)
This note was copied from the chart of [place]. Lactation Consultation Note  Patient Name: Cheryl Lara JKDTO'I Date: 11/27/2014 Reason for consult: Initial assessment;NICU baby Twins 8 hours old. Mom states that she nursed her first child. Mom reports that she has been pumping and has sent 3 small bottles of colostrum to twins. Enc mom to pump 8 times a day for 15 minutes. Mom given additional small colostrum bottles, and NICU booklet with review. Mom aware of NICU pumping rooms. Enc mom to offer STS/Kangaroo care as babies are able. Enc mom to hand express after pumping. Enc mom to call for assistance as needed.   Maternal Data Does the patient have breastfeeding experience prior to this delivery?: Yes  Feeding    LATCH Score/Interventions                      Lactation Tools Discussed/Used Pump Review: Setup, frequency, and cleaning;Milk Storage Initiated by:: Bedside nurse. Date initiated:: 11/27/14   Consult Status Consult Status: Follow-up Date: 11/28/14 Follow-up type: In-patient    Inocente Salles 11/27/2014, 3:17 PM

## 2014-11-27 NOTE — Transfer of Care (Signed)
Immediate Anesthesia Transfer of Care Note  Patient: Cheryl Lara  Procedure(s) Performed: Procedure(s): CESAREAN SECTION (N/A)  Patient Location: PACU  Anesthesia Type:Spinal  Level of Consciousness: awake, alert , oriented and patient cooperative  Airway & Oxygen Therapy: Patient Spontanous Breathing  Post-op Assessment: Report given to RN and Post -op Vital signs reviewed and stable  Post vital signs: Reviewed and stable  Last Vitals:  BP 102/48 HR 96 RR 18 TEMP 12.5 [id number] 99  Complications: No apparent anesthesia complications

## 2014-11-27 NOTE — Anesthesia Postprocedure Evaluation (Signed)
  Anesthesia Post-op Note  Patient: Cheryl Lara  Procedure(s) Performed: Procedure(s) (LRB): CESAREAN SECTION (N/A)  Patient Location: PACU  Anesthesia Type: Spinal  Level of Consciousness: awake and alert   Airway and Oxygen Therapy: Patient Spontanous Breathing  Post-op Pain: mild  Post-op Assessment: Post-op Vital signs reviewed, Patient's Cardiovascular Status Stable, Respiratory Function Stable, Patent Airway and No signs of Nausea or vomiting  Last Vitals:  Filed Vitals:   11/27/14 0500  BP: 112/55  Pulse: 91  Temp:   Resp: 27    Post-op Vital Signs: stable   Complications: No apparent anesthesia complications

## 2014-11-28 NOTE — Clinical Social Work Maternal (Signed)
CLINICAL SOCIAL WORK MATERNAL/CHILD NOTE  Patient Details  Name: Cheryl Lara MRN: [id number] Date of Birth: 04/20/1981  Date: 11/28/2014  Clinical Social Worker Initiating Note: Mohd. 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 They were pleasant and receptive to social work intervention. Parents are married. They have one other dependent age 2. Both parents seems to be coping well with newborn NICU admission. Informed that they have spoken with the medical team and feels comfortable with the care newborns are receiving.  No acute social concerns related at this time. Parents informed of CSW availability.  CSW Plan/Description: Psychosocial Support and Ongoing Assessment of Needs    Kaige Whistler J, LCSW 11/28/2014, 4:10 PM    CLINICAL SOCIAL WORK MATERNAL/CHILD NOTE  Patient Details  Name: Cheryl Lara MRN: [id number] Date of Birth: [birth year]  Date:  11/28/2014  Clinical Social Worker Initiating Note:  Norlene Duel, LCSW Date/ Time Initiated:  11/28/14/1605     Child's Name:  Cheryl Lara and Cheryl Lara   Legal Guardian:   (Parents Njeri and Robert Bellow)   Need for Interpreter:  None   Date of Referral:  11/28/14     Reason for Referral:   (NICU admission)   Referral Source:  NICU   Address:  [place], [place] [address]  Phone number:   [phone])   Household Members:  Spouse, Minor Children   Natural Supports (not living in the home):  Extended Family, Immediate Family   Professional Supports: None   Employment: Self-employed   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  They were pleasant and receptive to social work intervention.  Parents are married.  They have one other dependent age 73.   Both parents seems to be coping well with newborn NICU admission.  Informed that they have spoken with the medical team and feels comfortable with the care newborns are receiving.    No acute social concerns related at this time.  Parents informed of CSW availability.  CSW Plan/Description:  Psychosocial Support and Ongoing Assessment of Needs    Anamae Rochelle J, LCSW 11/28/2014, 4:10 PM

## 2014-11-28 NOTE — Plan of Care (Signed)
Problem: Phase I Progression Outcomes Goal: Voiding adequately Outcome: Completed/Met Date Met:  11/28/14 Voided large amount of clear amber urine x 2 so taken off of I&O.     

## 2014-11-28 NOTE — Plan of Care (Signed)
Problem: Phase I Progression Outcomes Goal: Other Phase I Outcomes/Goals Outcome: Not Applicable Date Met:  94/32/00 Has walked in halls and room and tolerated well.  Problem: Phase II Progression Outcomes Goal: Other Phase II Outcomes/Goals Outcome: Completed/Met Date Met:  12/05/2014 Has passed flatus.

## 2014-11-28 NOTE — Progress Notes (Signed)
Patient ID: Cheryl Lara, female   DOB: 02-23-1981, 34 y.o.   MRN: [id number] Subjective: S/P Cesarean Delivery of Dichorionic - Diamniotic Twins / Preterm Labor  POD# 1  Information for the patient's newborn:  Jolean, Madariaga [[id number]]  female Information for the patient's newborn:  Jozy, Mcphearson [[id number]]  female   both infants in NICU / "female on [place] - received platelets last night, female on RA - both doing well"  Reports feeling very tired and sore Feeding: infants in NICU - pumping about 1/2 oz of colostrum Patient reports tolerating PO.  Breast symptoms: pumping breast milk Pain controlled with ibuprofen (OTC) and narcotic analgesics including Percocet Denies HA/SOB/C/P/N/V/dizziness. Flatus none, but bleching. No BM. She reports vaginal bleeding as normal, without clots.  She is ambulating, urinating without difficult.     Objective:   VS:  Filed Vitals:   11/27/14 1718 11/27/14 2213 11/28/14 0214 11/28/14 0623  BP: 106/60 103/58 102/63 97/56  Pulse: 70 77 81 79  Temp: 97.8 F (36.6 C) 97.8 F (36.6 C) 98.2 F (36.8 C) 97.8 F (36.6 C)  TempSrc: Oral Oral Oral Oral  Resp: 18 18 15 18   Height:      Weight:      SpO2: 98% 99% 98% 98%      Intake/Output Summary (Last 24 hours) at 11/28/14 0949 Last data filed at 11/27/14 1844  Gross per 24 hour  Intake 3521.25 ml  Output   4500 ml  Net -978.75 ml         Recent Labs  11/27/14 0255 11/27/14 0800  WBC 21.3* 19.0*  HGB 10.3* 9.7*  HCT 30.7* 29.1*  PLT 180 181     Blood type: A POS (08/05 0854)  Rubella: Immune (03/14 0000)     Physical Exam:  General: alert, cooperative, fatigued and no distress CV: Regular rate and rhythm, S1S2 present or without murmur or extra heart sounds Resp: clear Abdomen: soft, nontender, normal bowel sounds Incision: serous drainage present Uterine Fundus: firm, 1 FB below umbilicus, appropriately postoperatively tender Lochia: moderate Ext: extremities normal,  atraumatic, no cyanosis or edema and Homans sign is negative, no sign of DVT  Assessment/Plan: 34 y.o.   POD# 1.  s/p Cesarean Delivery.  Indications: preterm labor / Di-Di twins / malpresentation of twin B                Principal Problem:   Postpartum care following cesarean delivery of Di-Di twins (8/6) Active Problems:   Acute blood loss anemia   Dichorionic diamniotic twin pregnancy in third trimester   Preterm uterine contractions in third trimester, antepartum   Twins   [redacted] weeks gestation of pregnancy   Encounter for fetal anatomic survey   Iron deficiency anemia  Doing well, stable.               Advance diet as tolerated Continue Niferex 150 mg daily Continue Magnesium Oxide 200 mg daily Ambulate Encouraged to pump every 2-3 hrs to stimulate breasts to produce more colostrum and encourage breast milk formation / educated that breast milk should come in by day 3 or 4, maybe sooner since breastfed before Routine post-op care  Laury Deep, M, MSN, CNM 11/28/2014, 9:49 AM

## 2014-11-29 ENCOUNTER — Encounter (HOSPITAL_COMMUNITY): Payer: Self-pay | Admitting: Obstetrics and Gynecology

## 2014-11-29 MED ORDER — DOCUSATE SODIUM 100 MG PO CAPS
200.0000 mg | ORAL_CAPSULE | Freq: Two times a day (BID) | ORAL | Status: DC
  Administered 2014-11-29: 200 mg via ORAL
  Filled 2014-11-29 (×3): qty 2

## 2014-11-29 MED ORDER — IBUPROFEN 800 MG PO TABS
800.0000 mg | ORAL_TABLET | Freq: Three times a day (TID) | ORAL | Status: DC
  Administered 2014-11-29 – 2014-11-30 (×3): 800 mg via ORAL
  Filled 2014-11-29 (×3): qty 1

## 2014-11-29 MED ORDER — MAGNESIUM OXIDE 400 (241.3 MG) MG PO TABS
400.0000 mg | ORAL_TABLET | Freq: Every day | ORAL | Status: DC
  Administered 2014-11-29: 400 mg via ORAL
  Filled 2014-11-29 (×3): qty 1

## 2014-11-29 NOTE — Progress Notes (Signed)
POSTOPERATIVE DAY # 2 S/P CS- twins (31 weeks after PPROM)  S:         Reports feeling well - sharp pain right side beside incision with movement             Tolerating po intake / no nausea / no vomiting / + flatus / no BM             Bleeding is light             Pain controlled with motrin and percocet             Up ad lib / ambulatory/ voiding QS  Newborns in NICU / breast feeding planned - pumping Q 3 hours with good results  O:  VS: BP 107/63 mmHg  Pulse 73  Temp(Src) 98.3 F (36.8 C) (Oral)  Resp 20  Ht 5\' 9"  (1.753 m)  Wt 97.115 kg (214 lb 1.6 oz)  BMI 31.60 kg/m2  SpO2 99%  Breastfeeding? Unknown   LABS:               Recent Labs  11/27/14 0255 11/27/14 0800  WBC 21.3* 19.0*  HGB 10.3* 9.7*  PLT 180 181               Bloodtype: --/--/A POS (08/05 1448)  Rubella: Immune (03/14 0000)                                       Physical Exam:             Alert and Oriented X3  Lungs: Clear and unlabored  Heart: regular rate and rhythm / no mumurs  Abdomen: soft, non-tender, non-distended, active BS             Fundus: firm, non-tender, Ueven             Dressing intact honeycomb              Incision:  approximated with suture / no erythema / no ecchymosis / no drainage  Perineum: intact  Lochia: light  Extremities: no edema, no calf pain or tenderness  A:        POD # 2 S/P CS - twins (31 weeks)            ABL anemia            Hx bowel obstruction - chronic constipation  P:        Routine postoperative care              Iron every other day at DC / daily PNV / magnesium 200-400 daily / colace 200 BID             Anticipate DC tomorrow   Artelia Laroche CNM, MSN, [facility] 11/29/2014, 8:47 AM

## 2014-11-29 NOTE — Addendum Note (Signed)
Addendum  created 11/29/14 1317 by Brock Ra, CRNA   Modules edited: Charges VN

## 2014-11-29 NOTE — Lactation Note (Signed)
This note was copied from the chart of [place]. Lactation Consultation Note  Patient Name: Cheryl Lara SAYTK'Z Date: 11/29/2014 Reason for consult: Follow-up assessment;NICU baby NICU baby 5 hours old, [redacted]w[redacted]d CGA. Parents at bedside, FOB holding baby boyB. Mom has brought EBM to NICU. Mom reports that she is pumping about 2.5 ounces of EBM every 2.5-3 hours. Mom states that the tip to sleep for 5 hours and then pump more frequently in the morning helped her to see and immediate increase in EBM volume, and she feels much better as well. Enc mom to call insurance company today for pump as mom's personal pump at home is older. Enc mom to continue pumping 8 times/24 hours for 15 minutes. Mom states that she is scheduled for D/C tomorrow 11-30-14.   Maternal Data    Feeding Feeding Type: Breast Milk Length of feed: 30 min  LATCH Score/Interventions                      Lactation Tools Discussed/Used     Consult Status Consult Status: PRN    Inocente Salles 11/29/2014, 10:51 AM

## 2014-11-29 NOTE — Addendum Note (Signed)
Addendum  created 11/29/14 1304 by Jonna Munro, CRNA   Modules edited: Charges VN

## 2014-11-29 NOTE — Addendum Note (Signed)
Addendum  created 11/29/14 1328 by Laverle Hobby, CRNA   Modules edited: Charges VN

## 2014-11-29 NOTE — Addendum Note (Signed)
Addendum  created 11/29/14 1302 by Jonna Munro, CRNA   Modules edited: Charges VN

## 2014-11-30 LAB — RPR: RPR: NONREACTIVE

## 2014-11-30 MED ORDER — OXYCODONE-ACETAMINOPHEN 5-325 MG PO TABS: 1.0000 | ORAL_TABLET | ORAL | Status: DC | PRN

## 2014-11-30 MED ORDER — IBUPROFEN 800 MG PO TABS: 800.0000 mg | ORAL_TABLET | Freq: Three times a day (TID) | ORAL | Status: DC

## 2014-11-30 MED ORDER — POLYSACCHARIDE IRON COMPLEX 150 MG PO CAPS: 150.0000 mg | ORAL_CAPSULE | Freq: Every day | ORAL | Status: DC

## 2014-11-30 MED ORDER — MAGNESIUM OXIDE 400 (241.3 MG) MG PO TABS: 200.0000 mg | ORAL_TABLET | Freq: Every day | ORAL | Status: DC

## 2014-11-30 NOTE — Progress Notes (Signed)
Pt ambulated out  With family

## 2014-11-30 NOTE — Discharge Summary (Signed)
POSTOPERATIVE DISCHARGE SUMMARY:  Patient ID: Cheryl Lara MRN: [id number] DOB/AGE: Mar 16, 1981 34 y.o.  Admit date: 11/20/2014 Admission Diagnoses: Preterm Labor / Dichorionic-Diamniotic Twins    Discharge date:  11/30/2014 Discharge Diagnoses: S/P Primary C/S due to Di-Di twins and malpresentation of twin B on 11/27/2014        Prenatal history: G2P1102   EDC : 01/29/2015, by Ultrasound  Has received prenatal care at [facility] since 9.[redacted] wks gestation. Primary provider : Dr. Ronita Hipps Prenatal course complicated by Di-Di Twins / Preterm Labor  Prenatal Labs: ABO, Rh: A POS (08/05 0854)  Antibody: NEG (08/05 0854) Rubella: Immune (03/14 0000)  RPR: Nonreactive (03/14 0000)  HBsAg: Negative (03/14 0000)  HIV: Non-reactive (03/14 0000)  GTT : Normal - 89 GBS: Negative (07/12 0000)   Medical / Surgical History :  Past medical history:  Past Medical History  Diagnosis Date  . Asthma     exercise induced  . GERD (gastroesophageal reflux disease)   . HPV (human papilloma virus) infection   . Scarlet fever     hx of  . Celiac disease   . Dichorionic diamniotic twin pregnancy in third trimester 11/20/2014  . Preterm uterine contractions in third trimester, antepartum 11/20/2014  . Postpartum care following cesarean delivery of Di-Di twins (8/6) 11/27/2014  . Iron deficiency anemia 11/27/2014  . Acute blood loss anemia 10/04/2012    Past surgical history:  Past Surgical History  Procedure Laterality Date  . Laparoscopic nissen fundoplication  0370  . Polyp      removal 2009  . Wisdom tooth extraction    . Tympanostomy tube placement    . Cryoablation    . Cesarean section N/A 11/27/2014    Procedure: CESAREAN SECTION;  Surgeon: Brien Few, MD;  Location: [facility];  Service: Obstetrics;  Laterality: N/A;     Allergies: Codeine; Gluten meal; and Other   Intrapartum Course:  Admitted to antenatal unit for preterm labor / inpatient on antenatal unit x 1 wk /  MgSO4 for neuro prophylaxis - transitioned to Procardia and Indocin while inpatient / BMZ x 3 at 24 wks and upon admission / Indocin d/c'd / active preterm labor and advanced dilatation ~ 24 hrs after d/c of Indocin / Urgent Cesarean Delivery of viable female and viable female  Physical Exam:   VSS: Blood pressure 110/60, pulse 74, temperature 97.9 F (36.6 C), temperature source Oral, resp. rate 16, height 5\' 9"  (1.753 m), weight 97.115 kg (214 lb 1.6 oz), SpO2 97 %, currently pumping breastmilk.  LABS: No results for input(s): WBC, HGB, PLT in the last 72 hours.  Newborn Data   Brylea, Pita [[id number]]  Live born female  Birth Weight: 3 lb 0.3 oz (1370 g) APGAR: 8, [address]   Aigner, Horseman [[id number]]  Live born female  Birth Weight: 3 lb 10.2 oz (1650 g) APGAR: 8, 8  See operative report for further details  Infants in NICU.  Discharge Instructions:  Wound Care: keep clean and dry / remove honeycomb POD 5 Postpartum Instructions: [facility] discharge booklet - instructions reviewed Medications:    Medication List    TAKE these medications        ibuprofen 800 MG tablet  Commonly known as:  ADVIL,MOTRIN  Take 1 tablet (800 mg total) by mouth every 8 (eight) hours.     iron polysaccharides 150 MG capsule  Commonly known as:  NIFEREX  Take 1 capsule (150 mg total) by mouth daily.  magnesium oxide 400 (241.3 MG) MG tablet  Commonly known as:  MAG-OX  Take 0.5 tablets (200 mg total) by mouth daily.     oxyCODONE-acetaminophen 5-325 MG per tablet  Commonly known as:  PERCOCET/ROXICET  Take 1 tablet by mouth every 4 (four) hours as needed (for pain scale 4-7).     prenatal multivitamin Tabs tablet  Take 1 tablet by mouth daily at 12 noon.     progesterone 100 MG capsule  Commonly known as:  PROMETRIUM  Place 100 mg vaginally daily.           Follow-up Information    Follow up with Lovenia Kim, MD. Schedule an appointment as soon as possible for a visit  in 6 weeks.   Specialty:  Obstetrics and Gynecology   Why:  postpartum visit   Contact information:   [place] [address] [phone]         Signed: Graceann Congress, MSN, CNM 11/30/2014, 9:13 AM

## 2014-11-30 NOTE — Discharge Instructions (Signed)
Breast Pumping Tips °If you are breastfeeding, there may be times when you cannot feed your baby directly. Returning to work or going on a trip are common examples. Pumping allows you to store breast milk and feed it to your baby later.  °You may not get much milk when you first start to pump. Your breasts should start to make more after a few days. If you pump at the times you usually feed your baby, you may be able to keep making enough milk to feed your baby without also using formula. The more often you pump, the more milk you will produce.  °WHEN SHOULD I PUMP?  °· You can begin to pump soon after delivery. However, some experts recommend waiting about 4 weeks before giving your infant a bottle to make sure breastfeeding is going well.  °· If you plan to return to work, begin pumping a few weeks before. This will help you develop techniques that work best for you. It also lets you build up a supply of breast milk.   °· When you are with your infant, feed on demand and pump after each feeding.   °· When you are away from your infant for several hours, pump for about 15 minutes every 2-3 hours. Pump both breasts at the same time if you can.   °· If your infant has a formula feeding, make sure to pump around the same time.     °· If you drink any alcohol, wait 2 hours before pumping.   °HOW DO I PREPARE TO PUMP? °Your let-down reflex is the natural reaction to stimulation that makes your breast milk flow. It is easier to stimulate this reflex when you are relaxed. Find relaxation techniques that work for you. If you have difficulty with your let-down reflex, try these methods:  °· Smell one of your infant's blankets or an item of clothing.   °· Look at a picture or video of your infant.   °· Sit in a quiet, private space.   °· Massage the breast you plan to pump.   °· Place soothing warmth on the breast.   °· Play relaxing music.   °WHAT ARE SOME GENERAL BREAST PUMPING TIPS? °· Wash your hands before you pump. You  do not need to wash your nipples or breasts. °· There are three ways to pump. °· You can use your hand to massage and compress your breast. °· You can use a handheld manual pump. °· You can use an electric pump.   °· Make sure the suction cup (flange) on the breast pump is the right size. Place the flange directly over the nipple. If it is the wrong size or placed the wrong way, it may be painful and cause nipple damage.   °· If pumping is uncomfortable, apply a small amount of purified or modified lanolin to your nipple and areola. °· If you are using an electric pump, adjust the speed and suction power to be more comfortable. °· If pumping is painful or if you are not getting very much milk, you may need a different type of pump. A lactation consultant can help you determine what type of pump to use.   °· Keep a full water bottle near you at all times. Drinking lots of fluid helps you make more milk.  °· You can store your milk to use later. Pumped breast milk can be stored in a sealable, sterile container or plastic bag. Label all stored breast milk with the date you pumped it. °· Milk can stay out at room temperature for up to 8 hours. °·   You can store your milk in the refrigerator for up to 8 days.  You can store your milk in the freezer for 3 months. Thaw frozen milk using warm water. Do not put it in the microwave.  Do not smoke. Smoking can lower your milk supply and harm your infant. If you need help quitting, ask your health care provider to recommend a program.  [place] A LACTATION CONSULTANT?  You are having trouble pumping.  You are concerned that you are not making enough milk.  You have nipple pain, soreness, or redness.  You want to use birth control. Birth control pills may lower your milk supply. Talk to your health care provider about your options. Document Released: 09/27/2009 Document Revised: 04/14/2013 Document Reviewed:  01/30/2013 [facility] Patient Information 2015 [place], [place]. This information is not intended to replace advice given to you by your health care provider. Make sure you discuss any questions you have with your health care provider. Postpartum Depression and Baby Blues The postpartum period begins right after the birth of a baby. During this time, there is often a great amount of joy and excitement. It is also a time of many changes in the life of the parents. Regardless of how many times a mother gives birth, each child brings new challenges and dynamics to the family. It is not unusual to have feelings of excitement along with confusing shifts in moods, emotions, and thoughts. All mothers are at risk of developing postpartum depression or the "baby blues." These mood changes can occur right after giving birth, or they may occur many months after giving birth. The baby blues or postpartum depression can be mild or severe. Additionally, postpartum depression can go away rather quickly, or it can be a long-term condition.  CAUSES Raised hormone levels and the rapid drop in those levels are thought to be a main cause of postpartum depression and the baby blues. A number of hormones change during and after pregnancy. Estrogen and progesterone usually decrease right after the delivery of your baby. The levels of thyroid hormone and various cortisol steroids also rapidly drop. Other factors that play a role in these mood changes include major life events and genetics.  RISK FACTORS If you have any of the following risks for the baby blues or postpartum depression, know what symptoms to watch out for during the postpartum period. Risk factors that may increase the likelihood of getting the baby blues or postpartum depression include:  Having a personal or family history of depression.   Having depression while being pregnant.   Having premenstrual mood issues or mood issues related to oral  contraceptives.  Having a lot of life stress.   Having marital conflict.   Lacking a social support network.   Having a baby with special needs.   Having health problems, such as diabetes.  SIGNS AND SYMPTOMS Symptoms of baby blues include:  Brief changes in mood, such as going from extreme happiness to sadness.  Decreased concentration.   Difficulty sleeping.   Crying spells, tearfulness.   Irritability.   Anxiety.  Symptoms of postpartum depression typically begin within the first month after giving birth. These symptoms include:  Difficulty sleeping or excessive sleepiness.   Marked weight loss.   Agitation.   Feelings of worthlessness.   Lack of interest in activity or food.  Postpartum psychosis is a very serious condition and can be dangerous. Fortunately, it is rare. Displaying any of the  following symptoms is cause for immediate medical attention. Symptoms of postpartum psychosis include:   Hallucinations and delusions.   Bizarre or disorganized behavior.   Confusion or disorientation.  DIAGNOSIS  A diagnosis is made by an evaluation of your symptoms. There are no medical or lab tests that lead to a diagnosis, but there are various questionnaires that a health care provider may use to identify those with the baby blues, postpartum depression, or psychosis. Often, a screening tool called the [place] Postnatal Depression Scale is used to diagnose depression in the postpartum period.  TREATMENT The baby blues usually goes away on its own in 1-2 weeks. Social support is often all that is needed. You will be encouraged to get adequate sleep and rest. Occasionally, you may be given medicines to help you sleep.  Postpartum depression requires treatment because it can last several months or longer if it is not treated. Treatment may include individual or group therapy, medicine, or both to address any social, physiological, and psychological factors  that may play a role in the depression. Regular exercise, a healthy diet, rest, and social support may also be strongly recommended.  Postpartum psychosis is more serious and needs treatment right away. Hospitalization is often needed. HOME CARE INSTRUCTIONS  Get as much rest as you can. Nap when the baby sleeps.   Exercise regularly. Some women find yoga and walking to be beneficial.   Eat a balanced and nourishing diet.   Do little things that you enjoy. Have a cup of tea, take a bubble bath, read your favorite magazine, or listen to your favorite music.  Avoid alcohol.   Ask for help with household chores, cooking, grocery shopping, or running errands as needed. Do not try to do everything.   Talk to people close to you about how you are feeling. Get support from your partner, family members, friends, or other new moms.  Try to stay positive in how you think. Think about the things you are grateful for.   Do not spend a lot of time alone.   Only take over-the-counter or prescription medicine as directed by your health care provider.  Keep all your postpartum appointments.   Let your health care provider know if you have any concerns.  SEEK MEDICAL CARE IF: You are having a reaction to or problems with your medicine. SEEK IMMEDIATE MEDICAL CARE IF:  You have suicidal feelings.   You think you may harm the baby or someone else. MAKE SURE YOU:  Understand these instructions.  Will watch your condition.  Will get help right away if you are not doing well or get worse. Document Released: 01/12/2004 Document Revised: 04/14/2013 Document Reviewed: 01/19/2013 [facility] Patient Information 2015 [place], [place]. This information is not intended to replace advice given to you by your health care provider. Make sure you discuss any questions you have with your health care provider. Postpartum Care After Cesarean Delivery After you deliver your newborn (postpartum period),  the usual stay in the hospital is 24-72 hours. If there were problems with your labor or delivery, or if you have other medical problems, you might be in the hospital longer.  While you are in the hospital, you will receive help and instructions on how to care for yourself and your newborn during the postpartum period.  While you are in the hospital:  It is normal for you to have pain or discomfort from the incision in your abdomen. Be sure to tell your nurses when you are  having pain, where the pain is located, and what makes the pain worse.  If you are breastfeeding, you may feel uncomfortable contractions of your uterus for a couple of weeks. This is normal. The contractions help your uterus get back to normal size.  It is normal to have some bleeding after delivery.  For the first 1-3 days after delivery, the flow is red and the amount may be similar to a period.  It is common for the flow to start and stop.  In the first few days, you may pass some small clots. Let your nurses know if you begin to pass large clots or your flow increases.  Do not  flush blood clots down the toilet before having the nurse look at them.  During the next 3-10 days after delivery, your flow should become more watery and pink or brown-tinged in color.  Ten to fourteen days after delivery, your flow should be a small amount of yellowish-white discharge.  The amount of your flow will decrease over the first few weeks after delivery. Your flow may stop in 6-8 weeks. Most women have had their flow stop by 12 weeks after delivery.  You should change your sanitary pads frequently.  Wash your hands thoroughly with soap and water for at least 20 seconds after changing pads, using the toilet, or before holding or feeding your newborn.  Your intravenous (IV) tubing will be removed when you are drinking enough fluids.  The urine drainage tube (urinary catheter) that was inserted before delivery may be removed  within 6-8 hours after delivery or when feeling returns to your legs. You should feel like you need to empty your bladder within the first 6-8 hours after the catheter has been removed.  In case you become weak, lightheaded, or faint, call your nurse before you get out of bed for the first time and before you take a shower for the first time.  Within the first few days after delivery, your breasts may begin to feel tender and full. This is called engorgement. Breast tenderness usually goes away within 48-72 hours after engorgement occurs. You may also notice milk leaking from your breasts. If you are not breastfeeding, do not stimulate your breasts. Breast stimulation can make your breasts produce more milk.  Spending as much time as possible with your newborn is very important. During this time, you and your newborn can feel close and get to know each other. Having your newborn stay in your room (rooming in) will help to strengthen the bond with your newborn. It will give you time to get to know your newborn and become comfortable caring for your newborn.  Your hormones change after delivery. Sometimes the hormone changes can temporarily cause you to feel sad or tearful. These feelings should not last more than a few days. If these feelings last longer than that, you should talk to your caregiver.  If desired, talk to your caregiver about methods of family planning or contraception.  Talk to your caregiver about immunizations. Your caregiver may want you to have the following immunizations before leaving the hospital:  Tetanus, diphtheria, and pertussis (Tdap) or tetanus and diphtheria (Td) immunization. It is very important that you and your family (including grandparents) or others caring for your newborn are up-to-date with the Tdap or Td immunizations. The Tdap or Td immunization can help protect your newborn from getting ill.  Rubella immunization.  Varicella (chickenpox)  immunization.  Influenza immunization. You should receive this annual immunization  if you did not receive the immunization during your pregnancy. Document Released: 01/02/2012 Document Reviewed: 01/02/2012 [facility] Patient Information 2015 [place]. This information is not intended to replace advice given to you by your health care provider. Make sure you discuss any questions you have with your health care provider. Breastfeeding and Mastitis Mastitis is inflammation of the breast tissue. It can occur in women who are breastfeeding. This can make breastfeeding painful. Mastitis will sometimes go away on its own. Your health care provider will help determine if treatment is needed. CAUSES Mastitis is often associated with a blocked milk (lactiferous) duct. This can happen when too much milk builds up in the breast. Causes of excess milk in the breast can include:  Poor latch-on. If your baby is not latched onto the breast properly, she or he may not empty your breast completely while breastfeeding.  Allowing too much time to pass between feedings.  Wearing a bra or other clothing that is too tight. This puts extra pressure on the lactiferous ducts so milk does not flow through them as it should. Mastitis can also be caused by a bacterial infection. Bacteria may enter the breast tissue through cuts or openings in the skin. In women who are breastfeeding, this may occur because of cracked or irritated skin. Cracks in the skin are often caused when your baby does not latch on properly to the breast. SIGNS AND SYMPTOMS  Swelling, redness, tenderness, and pain in an area of the breast.  Swelling of the glands under the arm on the same side.  Fever may or may not accompany mastitis. If an infection is allowed to progress, a collection of pus (abscess) may develop. DIAGNOSIS  Your health care provider can usually diagnose mastitis based on your symptoms and a physical exam. Tests may be done  to help confirm the diagnosis. These may include:  Removal of pus from the breast by applying pressure to the area. This pus can be examined in the lab to determine which bacteria are present. If an abscess has developed, the fluid in the abscess can be removed with a needle. This can also be used to confirm the diagnosis and determine the bacteria present. In most cases, pus will not be present.  Blood tests to determine if your body is fighting a bacterial infection.  Mammogram or ultrasound tests to rule out other problems or diseases. TREATMENT  Mastitis that occurs with breastfeeding will sometimes go away on its own. Your health care provider may choose to wait 24 hours after first seeing you to decide whether a prescription medicine is needed. If your symptoms are worse after 24 hours, your health care provider will likely prescribe an antibiotic medicine to treat the mastitis. He or she will determine which bacteria are most likely causing the infection and will then select an appropriate antibiotic medicine. This is sometimes changed based on the results of tests performed to identify the bacteria, or if there is no response to the antibiotic medicine selected. Antibiotic medicines are usually given by mouth. You may also be given medicine for pain. HOME CARE INSTRUCTIONS  Only take over-the-counter or prescription medicines for pain, fever, or discomfort as directed by your health care provider.  If your health care provider prescribed an antibiotic medicine, take the medicine as directed. Make sure you finish it even if you start to feel better.  Do not wear a tight or underwire bra. Wear a soft, supportive bra.  Increase your fluid intake, especially  if you have a fever.  Continue to empty the breast. Your health care provider can tell you whether this milk is safe for your infant or needs to be thrown out. You may be told to stop nursing until your health care provider thinks it is  safe for your baby. Use a breast pump if you are advised to stop nursing.  Keep your nipples clean and dry.  Empty the first breast completely before going to the other breast. If your baby is not emptying your breasts completely for some reason, use a breast pump to empty your breasts.  If you go back to work, pump your breasts while at work to stay in time with your nursing schedule.  Avoid allowing your breasts to become overly filled with milk (engorged). SEEK MEDICAL CARE IF:  You have pus-like discharge from the breast.  Your symptoms do not improve with the treatment prescribed by your health care provider within 2 days. SEEK IMMEDIATE MEDICAL CARE IF:  Your pain and swelling are getting worse.  You have pain that is not controlled with medicine.  You have a red line extending from the breast toward your armpit.  You have a fever or persistent symptoms for more than 2-3 days.  You have a fever and your symptoms suddenly get worse. MAKE SURE YOU:   Understand these instructions.  Will watch your condition.  Will get help right away if you are not doing well or get worse. Document Released: 08/04/2004 Document Revised: 04/14/2013 Document Reviewed: 11/13/2012 [facility] Patient Information 2015 [place], [place]. This information is not intended to replace advice given to you by your health care provider. Make sure you discuss any questions you have with your health care provider. Breastfeeding Deciding to breastfeed is one of the best choices you can make for you and your baby. A change in hormones during pregnancy causes your breast tissue to grow and increases the number and size of your milk ducts. These hormones also allow proteins, sugars, and fats from your blood supply to make breast milk in your milk-producing glands. Hormones prevent breast milk from being released before your baby is born as well as prompt milk flow after birth. Once breastfeeding has begun, thoughts of  your baby, as well as his or her sucking or crying, can stimulate the release of milk from your milk-producing glands.  BENEFITS OF BREASTFEEDING For Your Baby  Your first milk (colostrum) helps your baby's digestive system function better.   There are antibodies in your milk that help your baby fight off infections.   Your baby has a lower incidence of asthma, allergies, and sudden infant death syndrome.   The nutrients in breast milk are better for your baby than infant formulas and are designed uniquely for your baby's needs.   Breast milk improves your baby's brain development.   Your baby is less likely to develop other conditions, such as childhood obesity, asthma, or type 2 diabetes mellitus.  For You   Breastfeeding helps to create a very special bond between you and your baby.   Breastfeeding is convenient. Breast milk is always available at the correct temperature and costs nothing.   Breastfeeding helps to burn calories and helps you lose the weight gained during pregnancy.   Breastfeeding makes your uterus contract to its prepregnancy size faster and slows bleeding (lochia) after you give birth.   Breastfeeding helps to lower your risk of developing type 2 diabetes mellitus, osteoporosis, and breast or ovarian cancer later  in life. SIGNS THAT YOUR BABY IS HUNGRY Early Signs of Hunger  Increased alertness or activity.  Stretching.  Movement of the head from side to side.  Movement of the head and opening of the mouth when the corner of the mouth or cheek is stroked (rooting).  Increased sucking sounds, smacking lips, cooing, sighing, or squeaking.  Hand-to-mouth movements.  Increased sucking of fingers or hands. Late Signs of Hunger  Fussing.  Intermittent crying. Extreme Signs of Hunger Signs of extreme hunger will require calming and consoling before your baby will be able to breastfeed successfully. Do not wait for the following signs of  extreme hunger to occur before you initiate breastfeeding:   Restlessness.  A loud, strong cry.   Screaming. BREASTFEEDING BASICS Breastfeeding Initiation  Find a comfortable place to sit or lie down, with your neck and back well supported.  Place a pillow or rolled up blanket under your baby to bring him or her to the level of your breast (if you are seated). Nursing pillows are specially designed to help support your arms and your baby while you breastfeed.  Make sure that your baby's abdomen is facing your abdomen.   Gently massage your breast. With your fingertips, massage from your chest wall toward your nipple in a circular motion. This encourages milk flow. You may need to continue this action during the feeding if your milk flows slowly.  Support your breast with 4 fingers underneath and your thumb above your nipple. Make sure your fingers are well away from your nipple and your baby's mouth.   Stroke your baby's lips gently with your finger or nipple.   When your baby's mouth is open wide enough, quickly bring your baby to your breast, placing your entire nipple and as much of the colored area around your nipple (areola) as possible into your baby's mouth.   More areola should be visible above your baby's upper lip than below the lower lip.   Your baby's tongue should be between his or her lower gum and your breast.   Ensure that your baby's mouth is correctly positioned around your nipple (latched). Your baby's lips should create a seal on your breast and be turned out (everted).  It is common for your baby to suck about 2-3 minutes in order to start the flow of breast milk. Latching Teaching your baby how to latch on to your breast properly is very important. An improper latch can cause nipple pain and decreased milk supply for you and poor weight gain in your baby. Also, if your baby is not latched onto your nipple properly, he or she may swallow some air during  feeding. This can make your baby fussy. Burping your baby when you switch breasts during the feeding can help to get rid of the air. However, teaching your baby to latch on properly is still the best way to prevent fussiness from swallowing air while breastfeeding. Signs that your baby has successfully latched on to your nipple:    Silent tugging or silent sucking, without causing you pain.   Swallowing heard between every 3-4 sucks.    Muscle movement above and in front of his or her ears while sucking.  Signs that your baby has not successfully latched on to nipple:   Sucking sounds or smacking sounds from your baby while breastfeeding.  Nipple pain. If you think your baby has not latched on correctly, slip your finger into the corner of your baby's mouth to break  the suction and place it between your baby's gums. Attempt breastfeeding initiation again. Signs of Successful Breastfeeding Signs from your baby:   A gradual decrease in the number of sucks or complete cessation of sucking.   Falling asleep.   Relaxation of his or her body.   Retention of a small amount of milk in his or her mouth.   Letting go of your breast by himself or herself. Signs from you:  Breasts that have increased in firmness, weight, and size 1-3 hours after feeding.   Breasts that are softer immediately after breastfeeding.  Increased milk volume, as well as a change in milk consistency and color by the fifth day of breastfeeding.   Nipples that are not sore, cracked, or bleeding. Signs That Your [employer]el Books is Getting Enough Milk  Wetting at least 3 diapers in a 24-hour period. The urine should be clear and pale yellow by age 24 days.  At least 3 stools in a 24-hour period by age 24 days. The stool should be soft and yellow.  At least 3 stools in a 24-hour period by age 80 days. The stool should be seedy and yellow.  No loss of weight greater than 10% of birth weight during the first 3 days of  age.  Average weight gain of 4-7 ounces (113-198 g) per week after age 25 days.  Consistent daily weight gain by age 36 days, without weight loss after the age of 2 weeks. After a feeding, your baby may spit up a small amount. This is common. BREASTFEEDING FREQUENCY AND DURATION Frequent feeding will help you make more milk and can prevent sore nipples and breast engorgement. Breastfeed when you feel the need to reduce the fullness of your breasts or when your baby shows signs of hunger. This is called "breastfeeding on demand." Avoid introducing a pacifier to your baby while you are working to establish breastfeeding (the first 4-6 weeks after your baby is born). After this time you may choose to use a pacifier. Research has shown that pacifier use during the first year of a baby's life decreases the risk of sudden infant death syndrome (SIDS). Allow your baby to feed on each breast as long as he or she wants. Breastfeed until your baby is finished feeding. When your baby unlatches or falls asleep while feeding from the first breast, offer the second breast. Because newborns are often sleepy in the first few weeks of life, you may need to awaken your baby to get him or her to feed. Breastfeeding times will vary from baby to baby. However, the following rules can serve as a guide to help you ensure that your baby is properly fed:  Newborns (babies 70 weeks of age or younger) may breastfeed every 1-3 hours.  Newborns should not go longer than 3 hours during the day or 5 hours during the night without breastfeeding.  You should breastfeed your baby a minimum of 8 times in a 24-hour period until you begin to introduce solid foods to your baby at around 62 months of age. BREAST MILK PUMPING Pumping and storing breast milk allows you to ensure that your baby is exclusively fed your breast milk, even at times when you are unable to breastfeed. This is especially important if you are going back to work while  you are still breastfeeding or when you are not able to be present during feedings. Your lactation consultant can give you guidelines on how long it is safe to store breast milk.  A breast pump is a machine that allows you to pump milk from your breast into a sterile bottle. The pumped breast milk can then be stored in a refrigerator or freezer. Some breast pumps are operated by hand, while others use electricity. Ask your lactation consultant which type will work best for you. Breast pumps can be purchased, but some hospitals and breastfeeding support groups lease breast pumps on a monthly basis. A lactation consultant can teach you how to hand express breast milk, if you prefer not to use a pump.  CARING FOR YOUR BREASTS WHILE YOU BREASTFEED Nipples can become dry, cracked, and sore while breastfeeding. The following recommendations can help keep your breasts moisturized and healthy:  Avoid using soap on your nipples.   Wear a supportive bra. Although not required, special nursing bras and tank tops are designed to allow access to your breasts for breastfeeding without taking off your entire bra or top. Avoid wearing underwire-style bras or extremely tight bras.  Air dry your nipples for 3-33minutes after each feeding.   Use only cotton bra pads to absorb leaked breast milk. Leaking of breast milk between feedings is normal.   Use lanolin on your nipples after breastfeeding. Lanolin helps to maintain your skin's normal moisture barrier. If you use pure lanolin, you do not need to wash it off before feeding your baby again. Pure lanolin is not toxic to your baby. You may also hand express a few drops of breast milk and gently massage that milk into your nipples and allow the milk to air dry. In the first few weeks after giving birth, some women experience extremely full breasts (engorgement). Engorgement can make your breasts feel heavy, warm, and tender to the touch. Engorgement peaks within 3-5  days after you give birth. The following recommendations can help ease engorgement:  Completely empty your breasts while breastfeeding or pumping. You may want to start by applying warm, moist heat (in the shower or with warm water-soaked hand towels) just before feeding or pumping. This increases circulation and helps the milk flow. If your baby does not completely empty your breasts while breastfeeding, pump any extra milk after he or she is finished.  Wear a snug bra (nursing or regular) or tank top for 1-2 days to signal your body to slightly decrease milk production.  Apply ice packs to your breasts, unless this is too uncomfortable for you.  Make sure that your baby is latched on and positioned properly while breastfeeding. If engorgement persists after 48 hours of following these recommendations, contact your health care provider or a Science writer. OVERALL HEALTH CARE RECOMMENDATIONS WHILE BREASTFEEDING  Eat healthy foods. Alternate between meals and snacks, eating 3 of each per day. Because what you eat affects your breast milk, some of the foods may make your baby more irritable than usual. Avoid eating these foods if you are sure that they are negatively affecting your baby.  Drink milk, fruit juice, and water to satisfy your thirst (about 10 glasses a day).   Rest often, relax, and continue to take your prenatal vitamins to prevent fatigue, stress, and anemia.  Continue breast self-awareness checks.  Avoid chewing and smoking tobacco.  Avoid alcohol and drug use. Some medicines that may be harmful to your baby can pass through breast milk. It is important to ask your health care provider before taking any medicine, including all over-the-counter and prescription medicine as well as vitamin and herbal supplements. It is possible to become pregnant while  breastfeeding. If birth control is desired, ask your health care provider about options that will be safe for your  baby. SEEK MEDICAL CARE IF:   You feel like you want to stop breastfeeding or have become frustrated with breastfeeding.  You have painful breasts or nipples.  Your nipples are cracked or bleeding.  Your breasts are red, tender, or warm.  You have a swollen area on either breast.  You have a fever or chills.  You have nausea or vomiting.  You have drainage other than breast milk from your nipples.  Your breasts do not become full before feedings by the fifth day after you give birth.  You feel sad and depressed.  Your baby is too sleepy to eat well.  Your baby is having trouble sleeping.   Your baby is wetting less than 3 diapers in a 24-hour period.  Your baby has less than 3 stools in a 24-hour period.  Your baby's skin or the white part of his or her eyes becomes yellow.   Your baby is not gaining weight by 74 days of age. SEEK IMMEDIATE MEDICAL CARE IF:   Your baby is overly tired (lethargic) and does not want to wake up and feed.  Your baby develops an unexplained fever. Document Released: 04/09/2005 Document Revised: 04/14/2013 Document Reviewed: 10/01/2012 [facility] Patient Information 2015 [place], [place]. This information is not intended to replace advice given to you by your health care provider. Make sure you discuss any questions you have with your health care provider.

## 2014-11-30 NOTE — Progress Notes (Signed)
Patient ID: TRISHNA CWIK, female   DOB: 25-Oct-1980, 34 y.o.   MRN: [id number] Subjective: S/P Primary Cesarean Delivery d/t Di-Di Twins / Preterm Labor  POD# 3 Information for the patient's newborn:  Vanya, Carberry [[id number]]  female Information for the patient's newborn:  Allisyn, Kunz [[id number]]  female  / circ infant in NICU  Reports feeling well Feeding: pumping breast milk for NICU  Patient reports tolerating PO.  Breast symptoms: none Pain controlled with ibuprofen (OTC) and narcotic analgesics including Percocet / RT side of incision continues to have sharp pain with movement Denies HA/SOB/C/P/N/V/dizziness. Flatus present. (+) BM x 2 yesterday. She reports vaginal bleeding as normal, without clots.  She is ambulating, urinating without difficult.     Objective:   VS:  Filed Vitals:   11/29/14 1400 11/29/14 1800 11/29/14 2248 11/30/14 0551  BP: 106/72 109/64 115/63 110/60  Pulse: 75 93 85 74  Temp: 98.3 F (36.8 C) 98.3 F (36.8 C) 98.6 F (37 C) 97.9 F (36.6 C)  TempSrc: Oral Oral Oral Oral  Resp: 18 18 16    Height:      Weight:      SpO2: 98% 100% 98% 97%    No intake or output data in the 24 hours ending 11/30/14 0903     No results for input(s): WBC, HGB, HCT, PLT in the last 72 hours.   Blood type: A POS (08/05 0854)  Rubella: Immune (03/14 0000)     Physical Exam:  General: alert, cooperative and no distress Abdomen: soft, nontender Incision: clean, dry, intact and skin well-approximated with sutures  Uterine Fundus: firm, 2 FB below umbilicus, nontender Lochia: minimal Ext: extremities normal, atraumatic, no cyanosis or edema, Homans sign is negative, no sign of DVT and no edema, redness or tenderness in the calves or thighs    Assessment/Plan: 34 y.o.   POD# 3.  s/p Cesarean Delivery.  Indications: Di-Di twins, preterm labor, malpresentation of twin B                Principal Problem:   Postpartum care following cesarean delivery of Di-Di  twins (8/6) Active Problems:   Acute blood loss anemia   Dichorionic diamniotic twin pregnancy in third trimester   Preterm uterine contractions in third trimester, antepartum   Twins   [redacted] weeks gestation of pregnancy   Encounter for fetal anatomic survey   Iron deficiency anemia  Doing well, stable.               Routine post-op care Continue Niferex 150 mg every other day d/t constipation Continue Magnesium Oxide 200 mg daily D/C Home today  Laury Deep, M, MSN, CNM 11/30/2014, 9:03 AM

## 2014-12-16 ENCOUNTER — Ambulatory Visit: Payer: Self-pay

## 2014-12-16 NOTE — Lactation Note (Signed)
This note was copied from the chart of Airyn Ellzey. Lactation Consultation Note  Called to NICU to assist with breastfeeding.  Mom has been nuzzling babies and states they both have latched and suckled.  Mom has a very abundant milk supply.  She is pumping 4-5 ounces from each breast.  Mom pre pumped about 60 mls from each breast prior to latch.  Positioned Faith in cross cradle hold.  Mom is very patient with reading baby's readiness.  After 5 minutes of nuzzling and licking Faith latched well and nursed actively for 10 minutes.  She tolerated feeding well and vitals/oxygen sat remained WNL. Baby fell asleep after 10 minutes.  Pre and post weight done and 12 mls transferred at this feeding.  Mom is pleased.  We discussed this was very good for 33.[redacted] weeks gestation.  Reassured that babies will improve with feedings and milk transfer as they reach term.  Assisted with Marlou Sa after Faith finished.  He nuzzled, licked and opened mouth.  Marlou Sa was able to sustain latch for 3 minutes and tolerated well.  No changes in vitals/oxygen sat noted.  No pre/post weight done.  Patient Name: Cheryl Lara [id number] Date: 12/16/2014 Reason for consult: Follow-up assessment;NICU baby   Maternal Data    Feeding Feeding Type: Breast Fed Length of feed: 10 min  LATCH Score/Interventions Latch: Grasps breast easily, tongue down, lips flanged, rhythmical sucking.  Audible Swallowing: Spontaneous and intermittent Intervention(s): Skin to skin;Hand expression;Alternate breast massage  Type of Nipple: Everted at rest and after stimulation  Comfort (Breast/Nipple): Soft / non-tender     Hold (Positioning): No assistance needed to correctly position infant at breast. Intervention(s): Breastfeeding basics reviewed (Lactation at Bedside)  LATCH Score: 10  Lactation Tools Discussed/Used     Consult Status      Ave Filter 12/16/2014, 3:31 PM

## 2014-12-24 ENCOUNTER — Ambulatory Visit: Payer: Self-pay

## 2014-12-24 NOTE — Lactation Note (Signed)
This note was copied from the chart of [place]. Lactation Consultation Note  Met with mom in the NICU.  She is putting both babies to breast twice per day and doing pre and post weights.  Babies are transferring good amounts for [redacted] weeks gestation and then supplemented with remainder needed.  Mom continues to have an abundant milk supply.  She is very motivated and pleased with babies progress.  Reviewed outpatient lactation services and recommended she schedule appointment about 1 week post discharge.  Encouraged to call with concerns/assist.  Patient Name: Cheryl Lara Today's Date: 12/24/2014     Maternal Data    Feeding Feeding Type: Breast Milk Nipple Type: Slow - flow Length of feed: 25 min  LATCH Score/Interventions                      Lactation Tools Discussed/Used     Consult Status      Ave Filter 12/24/2014, 12:48 PM

## 2017-01-24 ENCOUNTER — Other Ambulatory Visit: Payer: Self-pay | Admitting: Chiropractic Medicine

## 2017-11-03 ENCOUNTER — Emergency Department (HOSPITAL_COMMUNITY): Payer: No Typology Code available for payment source

## 2017-11-03 ENCOUNTER — Other Ambulatory Visit: Payer: Self-pay

## 2017-11-03 ENCOUNTER — Emergency Department (HOSPITAL_COMMUNITY)
Admission: EM | Admit: 2017-11-03 | Discharge: 2017-11-03 | Disposition: A | Discharge status: Home / Self Care | Payer: No Typology Code available for payment source | Attending: Emergency Medicine | Admitting: Emergency Medicine

## 2017-11-03 ENCOUNTER — Encounter (HOSPITAL_COMMUNITY): Payer: Self-pay

## 2017-11-03 DIAGNOSIS — J45909 Unspecified asthma, uncomplicated: Secondary | ICD-10-CM | POA: Insufficient documentation

## 2017-11-03 DIAGNOSIS — M5412 Radiculopathy, cervical region: Secondary | ICD-10-CM | POA: Insufficient documentation

## 2017-11-03 DIAGNOSIS — Z79899 Other long term (current) drug therapy: Secondary | ICD-10-CM | POA: Insufficient documentation

## 2017-11-03 DIAGNOSIS — R079 Chest pain, unspecified: Secondary | ICD-10-CM | POA: Diagnosis present

## 2017-11-03 LAB — BASIC METABOLIC PANEL
ANION GAP: 6 (ref 5–15)
BUN: 14 mg/dL (ref 6–20)
CHLORIDE: 108 mmol/L (ref 98–111)
CO2: 23 mmol/L (ref 22–32)
CREATININE: 0.78 mg/dL (ref 0.44–1.00)
Calcium: 8.8 mg/dL — ABNORMAL LOW (ref 8.9–10.3)
GFR calc non Af Amer: >60 mL/min (ref >60)
Glucose, Bld: 89 mg/dL (ref 70–99)
POTASSIUM: 3.6 mmol/L (ref 3.5–5.1)
SODIUM: 137 mmol/L (ref 135–145)

## 2017-11-03 LAB — I-STAT BETA HCG BLOOD, ED (MC, WL, AP ONLY)

## 2017-11-03 LAB — CBC
HCT: 37.8 % (ref 36.0–46.0)
HEMOGLOBIN: 12.5 g/dL (ref 12.0–15.0)
MCH: 32.3 pg (ref 26.0–34.0)
MCHC: 33.1 g/dL (ref 30.0–36.0)
MCV: 97.7 fL (ref 78.0–100.0)
PLATELETS: 202 10*3/uL (ref 150–400)
RBC: 3.87 MIL/uL (ref 3.87–5.11)
RDW: 12.3 % (ref 11.5–15.5)
WBC: 7.9 10*3/uL (ref 4.0–10.5)

## 2017-11-03 LAB — I-STAT TROPONIN, ED: Troponin i, poc: 0 ng/mL (ref 0.00–0.08)

## 2017-11-03 MED ORDER — METHOCARBAMOL 500 MG PO TABS: 1000.0000 mg | ORAL_TABLET | Freq: Three times a day (TID) | ORAL | 0 refills | Status: DC | PRN

## 2017-11-03 MED ORDER — CELECOXIB 50 MG PO CAPS: 50.0000 mg | ORAL_CAPSULE | Freq: Two times a day (BID) | ORAL | 0 refills | Status: DC | PRN

## 2017-11-03 NOTE — ED Provider Notes (Signed)
[facility] EMERGENCY DEPARTMENT Provider Note   CSN: [id number] Arrival date & time: 11/03/17  1358     History   Chief Complaint Chief Complaint  Patient presents with  . Chest Pain    HPI Cheryl Lara is a 37 y.o. female.  HPI Patient presents with 1/2 weeks of intermittent left upper chest burning.  States the burning radiates down the backside of her arm and causes tingling sensation to the third through fifth digits of her left hand over the palmar surface.  This is worse at night when she is lying on her right side.  Improves when she lies on her left side.  Denies any weakness.  She states she has having some spasms to the musculature of her left thoracic back.  No shortness of breath.  No fever or chills.  No known trauma the patient does go to the chiropractor for adjustments periodically. Past Medical History:  Diagnosis Date  . Acute blood loss anemia 10/04/2012  . Asthma    exercise induced  . Celiac disease   . Dichorionic diamniotic twin pregnancy in third trimester 11/20/2014  . GERD (gastroesophageal reflux disease)   . HPV (human papilloma virus) infection   . Iron deficiency anemia 11/27/2014  . Postpartum care following cesarean delivery of Di-Di twins (8/6) 11/27/2014  . Preterm uterine contractions in third trimester, antepartum 11/20/2014  . Scarlet fever    hx of    Patient Active Problem List   Diagnosis Date Noted  . Postpartum care following cesarean delivery of Di-Di twins (8/6) 11/27/2014  . Iron deficiency anemia 11/27/2014  . Dichorionic diamniotic twin pregnancy in third trimester 11/20/2014  . Preterm uterine contractions in third trimester, antepartum 11/20/2014  . Twins 11/20/2014  . [redacted] weeks gestation of pregnancy   . Encounter for fetal anatomic survey   . Partial small bowel obstruction ([place]) 01/03/2014  . SBO (small bowel obstruction) ([place]) 01/03/2014  . Acute blood loss anemia 10/04/2012    Past Surgical History:    Procedure Laterality Date  . CESAREAN SECTION N/A 11/27/2014   Procedure: CESAREAN SECTION;  Surgeon: Brien Few, MD;  Location: [facility];  Service: Obstetrics;  Laterality: N/A;  . CRYOABLATION    . LAPAROSCOPIC NISSEN FUNDOPLICATION  1761  . polyp     removal 2009  . TYMPANOSTOMY TUBE PLACEMENT    . WISDOM TOOTH EXTRACTION       OB History    Gravida  2   Para  2   Term  1   Preterm  1   AB      Living  2     SAB      TAB      Ectopic      Multiple  1   Live Births  2            Home Medications    Prior to Admission medications   Medication Sig Start Date End Date Taking? Authorizing Provider  amphetamine-dextroamphetamine (ADDERALL) 20 MG tablet Take 20 mg by mouth 3 (three) times daily. 09/30/17  Yes [provider]  pantoprazole (PROTONIX) 40 MG tablet Take 40 mg by mouth daily. 10/11/17  Yes [provider]  celecoxib (CELEBREX) 50 MG capsule Take 1 capsule (50 mg total) by mouth 2 (two) times daily as needed for pain. 11/03/17   Julianne Rice, MD  ibuprofen (ADVIL,MOTRIN) 800 MG tablet Take 1 tablet (800 mg total) by mouth every 8 (eight) hours. Patient not  taking: Reported on 11/03/2017 11/30/14   Laury Deep, CNM  iron polysaccharides (NIFEREX) 150 MG capsule Take 1 capsule (150 mg total) by mouth daily. Patient not taking: Reported on 11/03/2017 11/30/14   Laury Deep, CNM  magnesium oxide (MAG-OX) 400 (241.3 MG) MG tablet Take 0.5 tablets (200 mg total) by mouth daily. Patient not taking: Reported on 11/03/2017 11/30/14   Laury Deep, CNM  methocarbamol (ROBAXIN) 500 MG tablet Take 2 tablets (1,000 mg total) by mouth every 8 (eight) hours as needed for muscle spasms. 11/03/17   Julianne Rice, MD  oxyCODONE-acetaminophen (PERCOCET/ROXICET) 5-325 MG per tablet Take 1 tablet by mouth every 4 (four) hours as needed (for pain scale 4-7). Patient not taking: Reported on 11/03/2017 11/30/14   Laury Deep, CNM    Family  History Family History  Problem Relation Age of Onset  . Cancer Mother   . CAD Mother   . Hypertension Mother   . CAD Father     Social History Social History   Tobacco Use  . Smoking status: Never Smoker  . Smokeless tobacco: Never Used  Substance Use Topics  . Alcohol use: No  . Drug use: No     Allergies   Codeine; Gluten meal; and Other   Review of Systems Review of Systems  Constitutional: Negative for chills and fever.  Respiratory: Negative for cough and shortness of breath.   Cardiovascular: Positive for chest pain. Negative for palpitations and leg swelling.  Gastrointestinal: Negative for abdominal pain, constipation, diarrhea, nausea and vomiting.  Musculoskeletal: Positive for back pain and myalgias.  Skin: Negative for rash and wound.  Neurological: Positive for numbness. Negative for dizziness, weakness, light-headedness and headaches.  All other systems reviewed and are negative.    Physical Exam Updated Vital Signs BP 114/61   Pulse 84   Temp 98.6 F (37 C) (Oral)   Resp 15   SpO2 100%   Physical Exam  Constitutional: She is oriented to person, place, and time. She appears well-developed and well-nourished. No distress.  HENT:  Head: Normocephalic and atraumatic.  Mouth/Throat: Oropharynx is clear and moist. No oropharyngeal exudate.  Eyes: Pupils are equal, round, and reactive to light. EOM are normal.  Neck: Normal range of motion. Neck supple.  No posterior midline cervical tenderness to palpation.  Patient has some spasm noted to the left trapezius.  Positive axial load testing when head is turned to the right.  Cardiovascular: Normal rate and regular rhythm. Exam reveals no gallop and no friction rub.  No murmur heard. Pulmonary/Chest: Effort normal and breath sounds normal. No stridor. No respiratory distress. She has no wheezes. She has no rales. She exhibits no tenderness.  Abdominal: Soft. Bowel sounds are normal. There is no  tenderness. There is no rebound and no guarding.  Musculoskeletal: Normal range of motion. She exhibits no edema or tenderness.  Radial ulnar pulses 2+ in bilateral upper extremities.  Negative Tinel and Phalen sign.  No upper or lower extremity swelling or asymmetry.  No midline thoracic or lumbar tenderness.  Neurological: She is alert and oriented to person, place, and time.  Patient has paresthesias to the palmar surface of the third through fifth digits of the left hand.  5/5 grip strength bilaterally.  Intrinsic muscles in bilateral hands 5/5.  Elbow flexion extension 5/5.  Shoulder abduction/adduction 5/5.  Cranial nerves II through XII intact.  Skin: Skin is warm and dry. Capillary refill takes less than 2 seconds. No rash noted. She is not diaphoretic.  No erythema.  Psychiatric: She has a normal mood and affect. Her behavior is normal.  Nursing note and vitals reviewed.    ED Treatments / Results  Labs (all labs ordered are listed, but only abnormal results are displayed) Labs Reviewed  BASIC METABOLIC PANEL - Abnormal; Notable for the following components:      Result Value   Calcium 8.8 (*)    All other components within normal limits  CBC  I-STAT TROPONIN, ED  I-STAT BETA HCG BLOOD, ED ([facility], [facility], [facility] ONLY)    EKG EKG Interpretation  Date/Time:  "Sunday November 03 2017 14:05:26 EDT Ventricular Rate:  87 PR Interval:  160 QRS Duration: 94 QT Interval:  356 QTC Calculation: 428 R Axis:   81 Text Interpretation:  Normal sinus rhythm Normal ECG No old tracing to compare Confirmed by Knapp, Jon ([id number]) on 11/03/2017 2:43:49 PM   Radiology Dg Chest 2 View  Result Date: 11/03/2017 CLINICAL DATA:  Chest pain EXAM: CHEST - 2 VIEW COMPARISON:  None. FINDINGS: The heart size and mediastinal contours are within normal limits. Both lungs are clear. No pleural effusion or pneumothorax. The visualized skeletal structures are unremarkable. IMPRESSION: Normal chest radiographs.  Electronically Signed   By: Dawsyn Zurn  Ormond M.D.   On: 11/03/2017 14:44    Procedures Procedures (including critical care time)  Medications Ordered in ED Medications - No data to display   Initial Impression / Assessment and Plan / ED Course  I have reviewed the triage vital signs and the nursing notes.  Pertinent labs & imaging results that were available during my care of the patient were reviewed by me and considered in my medical decision making (see chart for details).     Symptoms consistent with cervical radiculopathy.  Chest pain is very atypical.  Likely related to the radiculopathy.  Patient has no family history of early cardiac disease.  Does not smoke cigarettes.  Low suspicion for PE.  Vital signs stable.  No shortness of breath.  EKG and troponin are normal.  Patient's calcium is mildly decreased.  Given lifestyle adjustments and advised judicial use of nonsteroidal anti-inflammatories.  If she starts to have any gastrointestinal symptoms she is aware she needs to stop the anti-inflammatories.  We will get a follow-up with sports medicine should her symptoms persist.  Strict return precautions have been given.    Final Clinical Impressions(s) / ED Diagnoses   Final diagnoses:  Cervical radiculopathy    ED Discharge Orders        Ordered    celecoxib (CELEBREX) 50 MG capsule  2 times daily PRN,   Status:  Discontinued     11/03/17 1534    methocarbamol (ROBAXIN) 500 MG tablet  Every 8 hours PRN,   Status:  Discontinued     11/03/17 1534    celecoxib (CELEBREX) 50 MG capsule  2 times daily PRN     11/03/17 1537    methocarbamol (ROBAXIN) 500 MG tablet  Every 8 hours PRN     07" /14/19 1537       Julianne Rice, MD 11/03/17 1556

## 2017-11-03 NOTE — ED Triage Notes (Signed)
Onset 1 week intermittant left sided chest pain and left arm numbness.  Pain will wake pt up during night as pt is laying on right side, if pt turns over on left side pain will subside and pt can return to sleep.   When pt wakes up in morning pain is minimal.

## 2019-05-29 ENCOUNTER — Other Ambulatory Visit: Payer: Self-pay

## 2019-05-29 ENCOUNTER — Ambulatory Visit: Payer: No Typology Code available for payment source | Admitting: Gastroenterology

## 2019-05-29 ENCOUNTER — Encounter: Payer: Self-pay | Admitting: Gastroenterology

## 2019-05-29 VITALS — BP 124/62 | Temp 98.4°F | Ht 69.0 in | Wt 203.4 lb

## 2019-05-29 DIAGNOSIS — Z86010 Personal history of colonic polyps: Secondary | ICD-10-CM | POA: Diagnosis not present

## 2019-05-29 DIAGNOSIS — K219 Gastro-esophageal reflux disease without esophagitis: Secondary | ICD-10-CM

## 2019-05-29 DIAGNOSIS — Z1211 Encounter for screening for malignant neoplasm of colon: Secondary | ICD-10-CM

## 2019-05-29 DIAGNOSIS — Z01818 Encounter for other preprocedural examination: Secondary | ICD-10-CM | POA: Diagnosis not present

## 2019-05-29 MED ORDER — PANTOPRAZOLE SODIUM 40 MG PO TBEC: 40.0000 mg | DELAYED_RELEASE_TABLET | Freq: Two times a day (BID) | ORAL | 3 refills | Status: DC

## 2019-05-29 MED ORDER — SUPREP BOWEL PREP KIT 17.5-3.13-1.6 GM/177ML PO SOLN: 1.0000 | ORAL | 0 refills | Status: DC

## 2019-05-29 NOTE — Patient Instructions (Addendum)
If you are age 39 or older, your body mass index should be between 23-30. Your Body mass index is 30.04 kg/m. If this is out of the aforementioned range listed, please consider follow up with your Primary Care Provider.  If you are age 27 or younger, your body mass index should be between 19-25. Your Body mass index is 30.04 kg/m. If this is out of the aformentioned range listed, please consider follow up with your Primary Care Provider.    You have been scheduled for an endoscopy and colonoscopy. Please follow the written instructions given to you at your visit today. Please pick up your prep supplies at the pharmacy within the next 1-3 days. If you use inhalers (even only as needed), please bring them with you on the day of your procedure.  We have sent the following medications to your pharmacy for you to pick up at your convenience: Suprep, Pantoprazole    Thank you for choosing me and [facility].  Janett Billow Zehr-PA

## 2019-06-05 ENCOUNTER — Encounter: Payer: Self-pay | Admitting: Gastroenterology

## 2019-06-05 DIAGNOSIS — Z86010 Personal history of colonic polyps: Secondary | ICD-10-CM | POA: Insufficient documentation

## 2019-06-05 DIAGNOSIS — Z1211 Encounter for screening for malignant neoplasm of colon: Secondary | ICD-10-CM | POA: Insufficient documentation

## 2019-06-05 DIAGNOSIS — K219 Gastro-esophageal reflux disease without esophagitis: Secondary | ICD-10-CM | POA: Insufficient documentation

## 2019-06-05 DIAGNOSIS — Z01818 Encounter for other preprocedural examination: Secondary | ICD-10-CM | POA: Insufficient documentation

## 2019-06-05 NOTE — Progress Notes (Signed)
06/05/2019 Cheryl Lara [id number] 1981/01/11   HISTORY OF PRESENT ILLNESS:  This is a 39 year old female who is remotely known to Dr. Lyndel Safe for EGD and colonoscopy.  She is new to our office and requesting Dr. Ardis Hughs as her physician as he also cares for her mother and she does not want to have to go to [facility] for OVs with Dr. Lyndel Safe.  Nonetheless, she is here today to discuss having an EGD and colonoscopy.  She tells me that she had a colonoscopy several years ago by Dr. Lyndel Safe and had polyps removed.  We do not have those records and could not find them dating back 10 years in [place] system.  She also complains of a lot of acid reflux issues despite taking pantoprazole 40 mg daily.  Has remote history of Nissen.  She had esophogram in 02/2014 at which time she was found to have only small GERD in distal esophagus, but no hiatal hernia.  They said no upper esophageal web or stricture, but the barium tablet was stuck in the distal esophagus just above the GE junction for more than 30 seconds suggesting spasm or stricture.  Did not have EGD after that.  She says that she has tried prevacid, prilosec, and Nexium in the past for her reflux.  She also has celiac disease and has been gluten free since 2005.  Recent CBC, CMP, and TSH are normal.  She denies any issues with moving her bowels.  No rectal bleeding.    Referred here by her PCP, Dr. [name].   Past Medical History:  Diagnosis Date  . Acute blood loss anemia 10/04/2012  . Asthma    exercise induced  . Celiac disease   . Dichorionic diamniotic twin pregnancy in third trimester 11/20/2014  . GERD (gastroesophageal reflux disease)   . HPV (human papilloma virus) infection   . Iron deficiency anemia 11/27/2014  . Postpartum care following cesarean delivery of Di-Di twins (8/6) 11/27/2014  . Preterm uterine contractions in third trimester, antepartum 11/20/2014  . Scarlet fever    hx of   Past Surgical History:  Procedure Laterality Date  .  CESAREAN SECTION N/A 11/27/2014   Procedure: CESAREAN SECTION;  Surgeon: Brien Few, MD;  Location: [facility];  Service: Obstetrics;  Laterality: N/A;  . CRYOABLATION    . LAPAROSCOPIC NISSEN FUNDOPLICATION  8563  . polyp     removal 2009  . TYMPANOSTOMY TUBE PLACEMENT    . WISDOM TOOTH EXTRACTION      reports that she has never smoked. She has never used smokeless tobacco. She reports that she does not drink alcohol or use drugs. family history includes CAD in her father and mother; Cancer in her mother; Hypertension in her mother. Allergies  Allergen Reactions  . Codeine Nausea And Vomiting  . Gluten Meal Other (See Comments)    Unknown  . Other Hives    Cotton seed oil and gluten      Outpatient Encounter Medications as of 05/29/2019  Medication Sig  . amphetamine-dextroamphetamine (ADDERALL) 20 MG tablet Take 20 mg by mouth 3 (three) times daily.  . [DISCONTINUED] pantoprazole (PROTONIX) 40 MG tablet Take 40 mg by mouth daily.  . Na Sulfate-K Sulfate-Mg Sulf (SUPREP BOWEL PREP KIT) 17.5-3.13-1.6 GM/177ML SOLN Take 1 kit by mouth as directed. For colonoscopy prep  . pantoprazole (PROTONIX) 40 MG tablet Take 1 tablet (40 mg total) by mouth 2 (two) times daily.  . [DISCONTINUED] celecoxib (CELEBREX) 50 MG  capsule Take 1 capsule (50 mg total) by mouth 2 (two) times daily as needed for pain.  . [DISCONTINUED] ibuprofen (ADVIL,MOTRIN) 800 MG tablet Take 1 tablet (800 mg total) by mouth every 8 (eight) hours. (Patient not taking: Reported on 11/03/2017)  . [DISCONTINUED] iron polysaccharides (NIFEREX) 150 MG capsule Take 1 capsule (150 mg total) by mouth daily. (Patient not taking: Reported on 11/03/2017)  . [DISCONTINUED] magnesium oxide (MAG-OX) 400 (241.3 MG) MG tablet Take 0.5 tablets (200 mg total) by mouth daily. (Patient not taking: Reported on 11/03/2017)  . [DISCONTINUED] methocarbamol (ROBAXIN) 500 MG tablet Take 2 tablets (1,000 mg total) by mouth every 8 (eight) hours as needed for  muscle spasms.  . [DISCONTINUED] oxyCODONE-acetaminophen (PERCOCET/ROXICET) 5-325 MG per tablet Take 1 tablet by mouth every 4 (four) hours as needed (for pain scale 4-7). (Patient not taking: Reported on 11/03/2017)   No facility-administered encounter medications on file as of 05/29/2019.    REVIEW OF SYSTEMS  : All other systems reviewed and negative except where noted in the History of Present Illness.  PHYSICAL EXAM: BP 124/62   Temp 98.4 F (36.9 C)   Ht 5' 9" (1.753 m)   Wt 203 lb 6.4 oz (92.3 kg)   BMI 30.04 kg/m  General: Well developed white female in no acute distress; somewhat hyper with pressured speech. Head: Normocephalic and atraumatic Eyes:  Sclerae anicteric, conjunctiva pink. Ears: Normal auditory acuity Lungs: Clear throughout to auscultation; no increased WOB. Heart: Regular rate and rhythm; no M/R/G. Abdomen: Soft, non-distended.  BS present.  Non-tender. Rectal:  Will be done at the time of colonoscopy. Musculoskeletal: Symmetrical with no gross deformities.  Skin: No lesions on visible extremities Extremities: No edema  Neurological: Alert oriented x 4, grossly non-focal Psychological:  Alert and cooperative. Normal mood and affect  ASSESSMENT AND PLAN: *GERD:  Remote history of Nissen.  Having a lot of symptoms despite pantoprazole 40 mg daily.  Will increase to BID.  New prescription sent.  Will plan for EGD with Dr. Ardis Hughs as well. *Personal history of colon polyps per her report:  Patient questioning a lot about "virtual colonoscopy".  I advised her that if she did truly have polyps then would be recommended to do regular optical colonoscopy so that we could remove any further polyps and since she is already having EGD then we can just do it at the same time.  Also has to do polyp prep for either as well.  She agreed to optical colonoscopy with Dr. Ardis Hughs.  **The risks, benefits, and alternatives to EGD and colonoscopy were discussed with the patient and she  consents to proceed.    CC:  [name], MD

## 2019-06-07 NOTE — Progress Notes (Signed)
I agree with the above note, plan 

## 2019-06-22 ENCOUNTER — Other Ambulatory Visit: Payer: Self-pay | Admitting: Gastroenterology

## 2019-06-22 ENCOUNTER — Ambulatory Visit (INDEPENDENT_AMBULATORY_CARE_PROVIDER_SITE_OTHER): Payer: No Typology Code available for payment source

## 2019-06-22 ENCOUNTER — Telehealth: Payer: Self-pay | Admitting: Gastroenterology

## 2019-06-22 DIAGNOSIS — Z1159 Encounter for screening for other viral diseases: Secondary | ICD-10-CM

## 2019-06-22 LAB — SARS CORONAVIRUS 2 (TAT 6-24 HRS): SARS Coronavirus 2: NEGATIVE

## 2019-06-22 MED ORDER — SUPREP BOWEL PREP KIT 17.5-3.13-1.6 GM/177ML PO SOLN: 1.0000 | ORAL | 0 refills | Status: DC

## 2019-06-22 NOTE — Telephone Encounter (Signed)
Pt is scheduled for an EGD/col and stated that insurance does not cover Suprep.

## 2019-06-22 NOTE — Telephone Encounter (Signed)
Called and spoke with pt. I sent coupon code and new rx for Suprep to [facility].

## 2019-06-22 NOTE — Telephone Encounter (Signed)
Called pharmacy back and gave : Apply Coupon=BIN: [id number] PCN: [id number] GROUP: [id number] ID: [id number]

## 2019-06-22 NOTE — Telephone Encounter (Signed)
Pharmacist from [facility] stated that she had received fax for new rx but coupon did not show up.  Please call back for a verbal.

## 2019-06-22 NOTE — Addendum Note (Signed)
Addended byDebbe Mounts on: 06/22/2019 04:06 PM   Modules accepted: Orders

## 2019-06-24 ENCOUNTER — Encounter: Payer: Self-pay | Admitting: Gastroenterology

## 2019-06-24 ENCOUNTER — Other Ambulatory Visit: Payer: Self-pay

## 2019-06-24 ENCOUNTER — Ambulatory Visit (AMBULATORY_SURGERY_CENTER): Payer: No Typology Code available for payment source | Admitting: Gastroenterology

## 2019-06-24 VITALS — BP 114/64 | HR 80 | Temp 96.9°F | Resp 16 | Ht 69.0 in | Wt 203.0 lb

## 2019-06-24 DIAGNOSIS — Z86010 Personal history of colonic polyps: Secondary | ICD-10-CM | POA: Diagnosis not present

## 2019-06-24 DIAGNOSIS — K219 Gastro-esophageal reflux disease without esophagitis: Secondary | ICD-10-CM

## 2019-06-24 DIAGNOSIS — K2950 Unspecified chronic gastritis without bleeding: Secondary | ICD-10-CM | POA: Diagnosis not present

## 2019-06-24 DIAGNOSIS — R12 Heartburn: Secondary | ICD-10-CM | POA: Diagnosis not present

## 2019-06-24 DIAGNOSIS — D128 Benign neoplasm of rectum: Secondary | ICD-10-CM

## 2019-06-24 DIAGNOSIS — K3189 Other diseases of stomach and duodenum: Secondary | ICD-10-CM | POA: Diagnosis not present

## 2019-06-24 MED ORDER — SODIUM CHLORIDE 0.9 % IV SOLN: 500.0000 mL | Freq: Once | INTRAVENOUS | Status: DC

## 2019-06-24 NOTE — Op Note (Signed)
[facility] Patient Name: Cheryl Lara Procedure Date: 06/24/2019 2:52 PM MRN: [id number] Endoscopist: Milus Banister , MD Age: 39 Referring MD:  Date of Birth: [birth year] Gender: Female Account #: [id number] Procedure:                Colonoscopy Indications:              High risk colon cancer surveillance: Personal                            history of colonic polyps; colonoscopy >10 years                            ago, elsewhere, polyps were found per patient and                            she was told she needed a repeat examination in 5                            years. Medicines:                Monitored Anesthesia Care Procedure:                Pre-Anesthesia Assessment:                           - Prior to the procedure, a History and Physical                            was performed, and patient medications and                            allergies were reviewed. The patient's tolerance of                            previous anesthesia was also reviewed. The risks                            and benefits of the procedure and the sedation                            options and risks were discussed with the patient.                            All questions were answered, and informed consent                            was obtained. Prior Anticoagulants: The patient has                            taken no previous anticoagulant or antiplatelet                            agents. ASA Grade Assessment: II - A patient with  mild systemic disease. After reviewing the risks                            and benefits, the patient was deemed in                            satisfactory condition to undergo the procedure.                           After obtaining informed consent, the colonoscope                            was passed under direct vision. Throughout the                            procedure, the patient's blood pressure, pulse, and                 oxygen saturations were monitored continuously. The                            Colonoscope was introduced through the anus and                            advanced to the the cecum, identified by                            appendiceal orifice and ileocecal valve. The                            colonoscopy was performed without difficulty. The                            patient tolerated the procedure well. The quality                            of the bowel preparation was good. The ileocecal                            valve, appendiceal orifice, and rectum were                            photographed. Scope In: 2:59:57 PM Scope Out: 3:10:50 PM Scope Withdrawal Time: 0 hours 8 minutes 36 seconds  Total Procedure Duration: 0 hours 10 minutes 53 seconds  Findings:                 A 2 mm polyp was found in the distal rectum. The                            polyp was sessile. The polyp was removed with a                            cold snare. Resection and retrieval were complete.  Multiple small and large-mouthed diverticula were                            found in the left colon.                           The exam was otherwise without abnormality on                            direct and retroflexion views. Complications:            No immediate complications. Estimated blood loss:                            None. Estimated Blood Loss:     Estimated blood loss: none. Impression:               - One 2 mm polyp in the distal rectum, removed with                            a cold snare. Resected and retrieved.                           - Diverticulosis in the left colon.                           - The examination was otherwise normal on direct                            and retroflexion views. Recommendation:           - EGD now.                           - Await pathology results. Milus Banister, MD 06/24/2019 3:12:49 PM This report has been signed  electronically.

## 2019-06-24 NOTE — Progress Notes (Signed)
Called to room to assist during endoscopic procedure.  Patient ID and intended procedure confirmed with present staff. Received instructions for my participation in the procedure from the performing physician.  

## 2019-06-24 NOTE — Progress Notes (Signed)
Cheryl Lara

## 2019-06-24 NOTE — Progress Notes (Signed)
PT taken to PACU. Monitors in place. VSS. Report given to RN. 

## 2019-06-24 NOTE — Op Note (Signed)
[facility] Patient Name: Adin Elsberry Procedure Date: 06/24/2019 2:52 PM MRN: [id number] Endoscopist: Milus Banister , MD Age: 39 Referring MD:  Date of Birth: 1980/08/08 Gender: Female Account #: [id number] Procedure:                Upper GI endoscopy Indications:              Heartburn; remote fundoplication for GERD Medicines:                Monitored Anesthesia Care Procedure:                Pre-Anesthesia Assessment:                           - Prior to the procedure, a History and Physical                            was performed, and patient medications and                            allergies were reviewed. The patient's tolerance of                            previous anesthesia was also reviewed. The risks                            and benefits of the procedure and the sedation                            options and risks were discussed with the patient.                            All questions were answered, and informed consent                            was obtained. Prior Anticoagulants: The patient has                            taken no previous anticoagulant or antiplatelet                            agents. ASA Grade Assessment: II - A patient with                            mild systemic disease. After reviewing the risks                            and benefits, the patient was deemed in                            satisfactory condition to undergo the procedure.                           After obtaining informed consent, the endoscope was  passed under direct vision. Throughout the                            procedure, the patient's blood pressure, pulse, and                            oxygen saturations were monitored continuously. The                            Endoscope was introduced through the mouth, and                            advanced to the second part of duodenum. The upper                            GI endoscopy was  accomplished without difficulty.                            The patient tolerated the procedure well. Scope In: Scope Out: Findings:                 Localized minimal inflammation characterized by                            erythema and granularity was found in the gastric                            antrum. Biopsies were taken with a cold forceps for                            histology.                           Apparently intact fundoplication.                           The exam was otherwise without abnormality. Complications:            No immediate complications. Estimated blood loss:                            None. Estimated Blood Loss:     Estimated blood loss: none. Impression:               - Mild gastritis, biopsied to check for H. pylori.                           - Intact remote fundoplication.                           - The examination was otherwise normal. Recommendation:           - Patient has a contact number available for                            emergencies. The signs and symptoms of potential  delayed complications were discussed with the                            patient. Return to normal activities tomorrow.                            Written discharge instructions were provided to the                            patient.                           - Resume previous diet.                           - Continue present medications.                           - Await pathology results. Milus Banister, MD 06/24/2019 3:20:21 PM This report has been signed electronically.

## 2019-06-24 NOTE — Patient Instructions (Signed)
HANDOUTS GIVEN for diverticulosis, polyps, and gastritis.  YOU HAD AN ENDOSCOPIC PROCEDURE TODAY AT [facility]:   Refer to the procedure report that was given to you for any specific questions about what was found during the examination.  If the procedure report does not answer your questions, please call your gastroenterologist to clarify.  If you requested that your care partner not be given the details of your procedure findings, then the procedure report has been included in a sealed envelope for you to review at your convenience later.  YOU SHOULD EXPECT: Some feelings of bloating in the abdomen. Passage of more gas than usual.  Walking can help get rid of the air that was put into your GI tract during the procedure and reduce the bloating. If you had a lower endoscopy (such as a colonoscopy or flexible sigmoidoscopy) you may notice spotting of blood in your stool or on the toilet paper. If you underwent a bowel prep for your procedure, you may not have a normal bowel movement for a few days.  Please Note:  You might notice some irritation and congestion in your nose or some drainage.  This is from the oxygen used during your procedure.  There is no need for concern and it should clear up in a day or so.  SYMPTOMS TO REPORT IMMEDIATELY:   Following lower endoscopy (colonoscopy or flexible sigmoidoscopy):  Excessive amounts of blood in the stool  Significant tenderness or worsening of abdominal pains  Swelling of the abdomen that is new, acute  Fever of 100F or higher   Following upper endoscopy (EGD)  Vomiting of blood or coffee ground material  New chest pain or pain under the shoulder blades  Painful or persistently difficult swallowing  New shortness of breath  Fever of 100F or higher  Black, tarry-looking stools  For urgent or emergent issues, a gastroenterologist can be reached at any hour by calling [phone]. Do not use MyChart messaging for urgent  concerns.    DIET:  We do recommend a small meal at first, but then you may proceed to your regular diet.  Drink plenty of fluids but you should avoid alcoholic beverages for 24 hours.  ACTIVITY:  You should plan to take it easy for the rest of today and you should NOT DRIVE or use heavy machinery until tomorrow (because of the sedation medicines used during the test).    FOLLOW UP: Our staff will call the number listed on your records 48-72 hours following your procedure to check on you and address any questions or concerns that you may have regarding the information given to you following your procedure. If we do not reach you, we will leave a message.  We will attempt to reach you two times.  During this call, we will ask if you have developed any symptoms of COVID 19. If you develop any symptoms (ie: fever, flu-like symptoms, shortness of breath, cough etc.) before then, please call [phone].  If you test positive for Covid 19 in the 2 weeks post procedure, please call and report this information to [place].    If any biopsies were taken you will be contacted by phone or by letter within the next 1-3 weeks.  Please call us at [phone] if you have not heard about the biopsies in 3 weeks.    SIGNATURES/CONFIDENTIALITY: You and/or your care partner have signed paperwork which will be entered into your electronic medical record.  These signatures attest to  the fact that that the information above on your After Visit Summary has been reviewed and is understood.  Full responsibility of the confidentiality of this discharge information lies with you and/or your care-partner.

## 2019-06-26 ENCOUNTER — Telehealth: Payer: Self-pay | Admitting: *Deleted

## 2019-06-26 NOTE — Telephone Encounter (Signed)
  Follow up Call-  Call back number 06/24/2019  Post procedure Call Back phone  # [phone]  Permission to leave phone message Yes  Some recent data might be hidden     Patient questions:  Do you have a fever, pain , or abdominal swelling? No. Pain Score  0 *  Have you tolerated food without any problems? Yes.    Have you been able to return to your normal activities? Yes.    Do you have any questions about your discharge instructions: Diet   No. Medications  No. Follow up visit  No.  Do you have questions or concerns about your Care? No.  Actions: * If pain score is 4 or above: No action needed, pain <4.  1. Have you developed a fever since your procedure? no  2.   Have you had an respiratory symptoms (SOB or cough) since your procedure? no  3.   Have you tested positive for COVID 19 since your procedure no  4.   Have you had any family members/close contacts diagnosed with the COVID 19 since your procedure?  no   If yes to any of these questions please route to Joylene John, RN and Alphonsa Gin, Therapist, sports.

## 2019-07-01 ENCOUNTER — Encounter: Payer: Self-pay | Admitting: Gastroenterology

## 2020-07-19 ENCOUNTER — Other Ambulatory Visit: Payer: Self-pay

## 2020-07-19 ENCOUNTER — Other Ambulatory Visit: Payer: Self-pay | Admitting: Gastroenterology

## 2020-10-18 ENCOUNTER — Other Ambulatory Visit: Payer: Self-pay | Admitting: Gastroenterology

## 2020-10-23 DIAGNOSIS — U099 Post covid-19 condition, unspecified: Secondary | ICD-10-CM

## 2020-10-23 DIAGNOSIS — R002 Palpitations: Secondary | ICD-10-CM

## 2020-10-23 HISTORY — DX: Palpitations: R00.2

## 2020-10-23 HISTORY — DX: Post covid-19 condition, unspecified: U09.9

## 2020-10-26 ENCOUNTER — Emergency Department (HOSPITAL_BASED_OUTPATIENT_CLINIC_OR_DEPARTMENT_OTHER): Payer: No Typology Code available for payment source

## 2020-10-26 ENCOUNTER — Emergency Department (HOSPITAL_BASED_OUTPATIENT_CLINIC_OR_DEPARTMENT_OTHER)
Admission: EM | Admit: 2020-10-26 | Discharge: 2020-10-26 | Disposition: A | Discharge status: Home / Self Care | Payer: No Typology Code available for payment source | Attending: Emergency Medicine | Admitting: Emergency Medicine

## 2020-10-26 ENCOUNTER — Encounter (HOSPITAL_BASED_OUTPATIENT_CLINIC_OR_DEPARTMENT_OTHER): Payer: Self-pay

## 2020-10-26 ENCOUNTER — Other Ambulatory Visit: Payer: Self-pay

## 2020-10-26 DIAGNOSIS — R0602 Shortness of breath: Secondary | ICD-10-CM | POA: Diagnosis present

## 2020-10-26 DIAGNOSIS — J45909 Unspecified asthma, uncomplicated: Secondary | ICD-10-CM | POA: Insufficient documentation

## 2020-10-26 DIAGNOSIS — M79605 Pain in left leg: Secondary | ICD-10-CM | POA: Diagnosis not present

## 2020-10-26 DIAGNOSIS — M25562 Pain in left knee: Secondary | ICD-10-CM | POA: Insufficient documentation

## 2020-10-26 DIAGNOSIS — U071 COVID-19: Secondary | ICD-10-CM | POA: Diagnosis not present

## 2020-10-26 LAB — BASIC METABOLIC PANEL
Anion gap: 7 (ref 5–15)
BUN: 16 mg/dL (ref 6–20)
CO2: 25 mmol/L (ref 22–32)
Calcium: 8.6 mg/dL — ABNORMAL LOW (ref 8.9–10.3)
Chloride: 106 mmol/L (ref 98–111)
Creatinine, Ser: 0.68 mg/dL (ref 0.44–1.00)
GFR, Estimated: >60 mL/min (ref >60)
Glucose, Bld: 85 mg/dL (ref 70–99)
Potassium: 3.9 mmol/L (ref 3.5–5.1)
Sodium: 138 mmol/L (ref 135–145)

## 2020-10-26 LAB — URINALYSIS, ROUTINE W REFLEX MICROSCOPIC
Bilirubin Urine: NEGATIVE
Glucose, UA: NEGATIVE mg/dL
Hgb urine dipstick: NEGATIVE
Ketones, ur: NEGATIVE mg/dL
Leukocytes,Ua: NEGATIVE
Nitrite: NEGATIVE
Protein, ur: NEGATIVE mg/dL
Specific Gravity, Urine: 1.015 (ref 1.005–1.030)
pH: 7.5 (ref 5.0–8.0)

## 2020-10-26 LAB — CBC WITH DIFFERENTIAL/PLATELET
Abs Immature Granulocytes: 0.02 10*3/uL (ref 0.00–0.07)
Basophils Absolute: 0 10*3/uL (ref 0.0–0.1)
Basophils Relative: 0 %
Eosinophils Absolute: 0.1 10*3/uL (ref 0.0–0.5)
Eosinophils Relative: 2 %
HCT: 37.4 % (ref 36.0–46.0)
Hemoglobin: 12.6 g/dL (ref 12.0–15.0)
Immature Granulocytes: 0 %
Lymphocytes Relative: 27 %
Lymphs Abs: 1.4 10*3/uL (ref 0.7–4.0)
MCH: 33.1 pg (ref 26.0–34.0)
MCHC: 33.7 g/dL (ref 30.0–36.0)
MCV: 98.2 fL (ref 80.0–100.0)
Monocytes Absolute: 0.4 10*3/uL (ref 0.1–1.0)
Monocytes Relative: 9 %
Neutro Abs: 3.2 10*3/uL (ref 1.7–7.7)
Neutrophils Relative %: 62 %
Platelets: 151 10*3/uL (ref 150–400)
RBC: 3.81 MIL/uL — ABNORMAL LOW (ref 3.87–5.11)
RDW: 12.5 % (ref 11.5–15.5)
WBC: 5.1 10*3/uL (ref 4.0–10.5)
nRBC: 0 % (ref 0.0–0.2)

## 2020-10-26 LAB — PREGNANCY, URINE: Preg Test, Ur: NEGATIVE

## 2020-10-26 LAB — TROPONIN I (HIGH SENSITIVITY): Troponin I (High Sensitivity): <2 ng/L (ref <18)

## 2020-10-26 NOTE — Discharge Instructions (Addendum)
Follow-up with primary care doctor.  Take the Paxlovid. Come back if you develop chest pain, difficulty breathing or other new concerning symptom.

## 2020-10-26 NOTE — ED Provider Notes (Signed)
[facility]ER[facility]ENCY DEPARTMENT Provider Note   CSN: [id number] Arrival date & time: 10/26/20  1106     History Chief Complaint  Patient presents with   Leg Pain    Cheryl Lara is a 40 y.o. female.  Presents to ER with concern for leg pain.  Patient reports that she has been having symptoms of COVID since July 3.  Went to urgent care today and was diagnosed with COVID.  Was given an inhaler for wheezing as well as Paxlovid.  Pain is described as aching, primarily behind her left knee.  No chest pain.  Does have other body aches and does have mild shortness of breath.  Has noted dry cough.  Had fever couple days ago but no fever today.  Has been eating and drinking without difficulty.  Denies prior history of DVT/PE.  Denies chronic medical problems.  HPI     Past Medical History:  Diagnosis Date   Acute blood loss anemia 10/04/2012   Asthma    exercise induced   Celiac disease    Dichorionic diamniotic twin pregnancy in third trimester 11/20/2014   GERD (gastroesophageal reflux disease)    HPV (human papilloma virus) infection    Iron deficiency anemia 11/27/2014   Postpartum care following cesarean delivery of Di-Di twins (8/6) 11/27/2014   Preterm uterine contractions in third trimester, antepartum 11/20/2014   Scarlet fever    hx of    Patient Active Problem List   Diagnosis Date Noted   Gastroesophageal reflux disease 06/05/2019   Encounter for colonoscopy due to history of adenomatous colonic polyps 06/05/2019   Preop examination 06/05/2019   Postpartum care following cesarean delivery of Di-Di twins (8/6) 11/27/2014   Iron deficiency anemia 11/27/2014   Dichorionic diamniotic twin pregnancy in third trimester 11/20/2014   Preterm uterine contractions in third trimester, antepartum 11/20/2014   Twins 11/20/2014   [redacted] weeks gestation of pregnancy    Encounter for fetal anatomic survey    Partial small bowel obstruction ([place]) 01/03/2014   SBO (small bowel  obstruction) ([place]) 01/03/2014   Acute blood loss anemia 10/04/2012    Past Surgical History:  Procedure Laterality Date   CESAREAN SECTION N/A 11/27/2014   Procedure: CESAREAN SECTION;  Surgeon: Brien Few, MD;  Location: [facility];  Service: Obstetrics;  Laterality: N/A;   COLONOSCOPY     CRYOABLATION     LAPAROSCOPIC NISSEN FUNDOPLICATION  4098   polyp     removal 2009   TYMPANOSTOMY TUBE PLACEMENT     UPPER GASTROINTESTINAL ENDOSCOPY     WISDOM TOOTH EXTRACTION       OB History     Gravida  2   Para  2   Term  1   Preterm  1   AB      Living  2      SAB      IAB      Ectopic      Multiple  1   Live Births  2           Family History  Problem Relation Age of Onset   Cancer Mother    CAD Mother    Hypertension Mother    CAD Father    Colon cancer Maternal Grandfather    Esophageal cancer Neg Hx    Rectal cancer Neg Hx    Stomach cancer Neg Hx     Social History   Tobacco Use   Smoking status: Never   Smokeless  tobacco: Never  Vaping Use   Vaping Use: Never used  Substance Use Topics   Alcohol use: Yes    Comment: occ   Drug use: No    Home Medications Prior to Admission medications   Medication Sig Start Date End Date Taking? Authorizing Provider  pantoprazole (PROTONIX) 40 MG tablet TAKE 1 TABLET BY MOUTH TWICE A DAY 10/18/20  Yes Zehr, Janett Billow D, PA-C  albuterol (VENTOLIN HFA) 108 (90 Base) MCG/ACT inhaler Inhale into the lungs. 10/26/20   [provider]  MYDAYIS 50 MG CP24 Take 1 capsule by mouth every morning. 10/01/20   [provider]    Allergies    Codeine, Gluten meal, and Other  Review of Systems   Review of Systems  Constitutional:  Positive for fatigue and fever. Negative for chills.  HENT:  Negative for ear pain and sore throat.   Eyes:  Negative for pain and visual disturbance.  Respiratory:  Positive for cough and shortness of breath.   Cardiovascular:  Negative for chest pain and palpitations.   Gastrointestinal:  Negative for abdominal pain and vomiting.  Genitourinary:  Negative for dysuria and hematuria.  Musculoskeletal:  Positive for arthralgias and myalgias. Negative for back pain.  Skin:  Negative for color change and rash.  Neurological:  Negative for seizures and syncope.  All other systems reviewed and are negative.  Physical Exam Updated Vital Signs BP (!) 119/54 (BP Location: Right Arm)   Pulse 69   Temp 97.8 F (36.6 C) (Oral)   Resp 18   Ht 5\' 9"  (1.753 m)   Wt 76.2 kg   SpO2 100%   BMI 24.81 kg/m   Physical Exam Vitals and nursing note reviewed.  Constitutional:      General: She is not in acute distress.    Appearance: She is well-developed.  HENT:     Head: Normocephalic and atraumatic.  Eyes:     Conjunctiva/sclera: Conjunctivae normal.  Cardiovascular:     Rate and Rhythm: Normal rate and regular rhythm.     Heart sounds: No murmur heard. Pulmonary:     Effort: Pulmonary effort is normal. No respiratory distress.     Breath sounds: Normal breath sounds.  Abdominal:     Palpations: Abdomen is soft.     Tenderness: There is no abdominal tenderness.  Musculoskeletal:        General: No swelling, tenderness, deformity or signs of injury.     Cervical back: Neck supple.     Right lower leg: No edema.     Left lower leg: No edema.  Skin:    General: Skin is warm and dry.     Coloration: Skin is not jaundiced.  Neurological:     General: No focal deficit present.     Mental Status: She is alert.  Psychiatric:        Mood and Affect: Mood normal.    ED Results / Procedures / Treatments   Labs (all labs ordered are listed, but only abnormal results are displayed) Labs Reviewed  CBC WITH DIFFERENTIAL/PLATELET - Abnormal; Notable for the following components:      Result Value   RBC 3.81 (*)    All other components within normal limits  BASIC METABOLIC PANEL - Abnormal; Notable for the following components:   Calcium 8.6 (*)    All  other components within normal limits  PREGNANCY, URINE  URINALYSIS, ROUTINE W REFLEX MICROSCOPIC  TROPONIN I (HIGH SENSITIVITY)    EKG EKG Interpretation  Date/Time:  Wednesday October 26 2020 11:51:49 EDT Ventricular Rate:  74 PR Interval:  173 QRS Duration: 95 QT Interval:  360 QTC Calculation: 400 R Axis:   77 Text Interpretation: Sinus rhythm Confirmed by Madalyn Rob [phone]) on 10/27/2020 7:10:28 AM  Radiology US Venous Img Lower Unilateral Left  Result Date: 10/26/2020 CLINICAL DATA:  Left leg pain. EXAM: LEFT LOWER EXTREMITY VENOUS DOPPLER ULTRASOUND TECHNIQUE: Gray-scale sonography with compression, as well as color and duplex ultrasound, were performed to evaluate the deep venous system(s) from the level of the common femoral vein through the popliteal and proximal calf veins. COMPARISON:  None. FINDINGS: VENOUS Normal compressibility of the common femoral, superficial femoral, and popliteal veins, as well as the visualized calf veins. Visualized portions of profunda femoral vein and great saphenous vein unremarkable. No filling defects to suggest DVT on grayscale or color Doppler imaging. Doppler waveforms show normal direction of venous flow, normal respiratory plasticity and response to augmentation. Limited views of the contralateral common femoral vein are unremarkable. OTHER None. Limitations: None. IMPRESSION: Negative exam. Electronically Signed   By: Inge Rise M.D.   On: 10/26/2020 13:57   DG Chest Portable 1 View  Result Date: 10/26/2020 CLINICAL DATA:  left leg pain, chest pain, concern for DVT EXAM: PORTABLE CHEST 1 VIEW COMPARISON:  Radiograph 11/03/2017 FINDINGS: The heart size and mediastinal contours are within normal limits.No focal airspace disease. No pleural effusion or pneumothorax.No acute osseous abnormality. IMPRESSION: No evidence of acute cardiopulmonary disease. Electronically Signed   By: Maurine Simmering   On: 10/26/2020 12:54    Procedures Procedures    Medications Ordered in ED Medications - No data to display  ED Course  I have reviewed the triage vital signs and the nursing notes.  Pertinent labs & imaging results that were available during my care of the patient were reviewed by me and considered in my medical decision making (see chart for details).    MDM Rules/Calculators/A&P                          40 year old lady presents to ER with concern for left leg pain in the setting of recently diagnosed COVID-19.  On exam she appears well in no acute distress.  Leg appears normal, neurovascularly intact.  Given symptomatology, will check DVT study.  No DVT.  Chest x-ray without any focal airspace disease.  EKG within normal limits, basic labs stable.  She has no hypoxia, no tachypnea.  Believe she is appropriate for outpatient management at present.  Suspect her pain is more likely myalgia from the covid. Dc home. Reports already been prescribed paxlovid.     After the discussed management above, the patient was determined to be safe for discharge.  The patient was in agreement with this plan and all questions regarding their care were answered.  ED return precautions were discussed and the patient will return to the ED with any significant worsening of condition.     Final Clinical Impression(s) / ED Diagnoses Final diagnoses:  [id number]    Rx / DC Orders ED Discharge Orders     None        Lucrezia Starch, MD 10/27/20 [phone]

## 2020-10-26 NOTE — ED Triage Notes (Signed)
Was seen at [facility] this a.m. and tested positive for COVID.  Rx for Inhaler due to wheezing  Hx Asthma.  Symptoms started July 3rd.  Has been vaccinated.   Concerned about pain  5/10 behind Left knee that radiates up & down lateral leg.    Headache  Afebrile- Taking Advil Dry Cough, mild shortness of breath Sore throat  No vomiting or diarrhea  NAD

## 2020-12-09 ENCOUNTER — Other Ambulatory Visit: Payer: Self-pay | Admitting: Gastroenterology

## 2021-01-30 NOTE — Progress Notes (Signed)
Primary Care Provider: [name], MD [facility] Cardiologist: Cheryl Hew, MD Electrophysiologist: None  Clinic Note: Chief Complaint  Patient presents with   New Patient (Initial Visit)    ? COVID LONG-HAULER Sx -- Palpitations, chest pressure,persistent SOB/ cough/ fatigue   Tachycardia    HR jumps up into 120s-140s with standing up & minimal activity   Chest Pain    Hard to catch her breath     ===================================  ASSESSMENT/PLAN   Problem List Items Addressed This Visit     Rapid palpitations    Episodes sound somewhat concerning for postural hypotension.  I wonder if this is probably because she is not eating and drinking so well.  She says she is trying to drink maybe 64 ounces of water a day, but not eating that much.   Plan: 3-day Zio patch to assess for arrhythmia versus simply sinus tachycardia. Liberalize fluid and salt intake-increase to 80-100 ounces of water a day Check echocardiogram to exclude structural abnormality.  Had a history of scarlet fever as a child which could have led to rheumatic disease making valvular disease possible (could not exclude soft murmur on exam)        Relevant Orders   EKG 12-Lead (Completed)   ECHOCARDIOGRAM COMPLETE   LONG TERM MONITOR (3-14 DAYS)   Cardiopulmonary exercise test   Chest pressure    Somewhat atypical in nature, does not really sound anginal, but is associate with tachycardia.  Need to exclude structural abnormality and ischemia.  Plan: 2D Echocardiogram and CPX.      Relevant Orders   EKG 12-Lead (Completed)   ECHOCARDIOGRAM COMPLETE   LONG TERM MONITOR (3-14 DAYS)   Cardiopulmonary exercise test   DOE (dyspnea on exertion)    I suspect her exertional dyspnea is related to deconditioning with basely being out of commission for 2 months.  This is in combination of recovering from her respiratory infection.  Says she is having chest pain and palpitations and is concerned for  long-haul syndrome from COVID we will evaluate cardiac function and structure with a 2D echocardiogram.  To determine cardiac versus pulmonary etiology versus simply deconditioning we will also check A CPX (Cardiopulmonary Exercise Stress Test)  Shared Decision Making/Informed Consent{ The risks [chest pain, shortness of breath, cardiac arrhythmias, dizziness, blood pressure fluctuations, myocardial infarction, stroke/transient ischemic attack, and life-threatening complications (estimated to be 1 in 10,000)], benefits (risk stratification, diagnosing coronary artery disease, treatment guidance) and alternatives of an exercise tolerance test were discussed in detail with Cheryl Lara and she agrees to proceed.       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  No clear-cut answers to what was posterior to follow this up, but need to exclude potential for myocarditis with reduced EF or or other valvular disease.  With palpitations, will order a monitor.  Also to evaluate dyspnea we will order CPX.   Plan: 2D echo, 3-day Zio patch monitor, CPX; gradually build back exercise based on CPX results.  Liberalize fluid intake as well as salt intake.      Relevant Orders   EKG 12-Lead (Completed)   ECHOCARDIOGRAM COMPLETE   LONG TERM MONITOR (3-14 DAYS)   Cardiopulmonary exercise test    ===================================  HPI:    Cheryl Lara is a 40  y.o. female who is being seen today for the evaluation of Chest Pain and Palpitations at the request of Cheryl Nakai, MD.  Recent Hospitalizations:  10/26/2020: ER visit with leg pain.  Was having symptoms of COVID since July of 3rd -> was given Inhaler & Paxlovid. Fever was waning.  Work-up was negative.  Discharged home.  Cheryl Lara was seen by Dr. Truman Lara  on several occasions after discharge. 11/07/2020: Noted persistent hacking cough ever since discharge from the ER.  She noted heartburn symptoms 11/15/2020: Persistent symptoms noting GERD continue to have fatigue, cough, confusion and palpitations; noted having some edema, most continued on spironolactone.  Because of neurologic symptoms, was referred to neurology for long-haul COVID symptoms, and also for some palpitation symptoms was referred to cardiology.  Referred to pulmonology as well. 01/13/2021: HPI says nothing different.  No change the plan she is  Reviewed  CV studies:    The following studies were reviewed today: (if available, images/films reviewed: From Epic Chart or Care Everywhere) Lower Extremity Venous Dopplers 10/26/2020:: No DVT   Interval History:   Cheryl Lara is here today at the request of her PCP, but she is the daughter of the patient of mine who I see routinely for CAD.  She has several complaints some of which seem to be make it may be related to cardiology.  Prior to having COVID in July, she was doing pretty well.  She was active walking with no major issues.  She had some dyspnea associated to her asthma, and maybe had some palpitations off and on.  Otherwise she has been doing relatively well.  With the COVID infection, the rest of her family recovered relatively quickly and did not get all that sick.  She never had the significant fevers severe dyspnea, but she has just had chronic fatigue, hacking cough and shortness of breath.  She pretty much has been unable to do hardly anything.  No energy whatsoever.  What she is noticed is that overall things are getting better and she is starting to become more active.  In fact the symptoms that initially led to her consult with cardiology seem to be getting better as time goes by.  About a week or so after she had her initial spells of COVID, she started noticing having episodes where she would feel her heart rate racing  into the 120s 140s simply was standing to get up to walk somewhere.  It was as if her heart rate would just run away, and even if she stopped walking, it would take a while to slow down.  This was associate with some lightheadedness and dizziness but also some dyspnea and even some discomfort in her chest.    Do all of this, she has not been eating and drinking well.  She has lost quite a bit of weight and has been having a hard time he just getting things to stay down.  She says that for most of the month of July, she just spent almost the entire time sleeping up to 20 hours a day.  She had no energy whatsoever to do anything.  She was having a hard time keeping up with her kids.    This all changed on August 4 because of an incident that occurred with her 70-year-old.  He was outside playing, and let out a loud scream and came running toward her.  She thought that maybe he had been swelling by ants, and actually was able to  run toward him which is the first time she had done that in a month.  When she got there she was notably stressed and found that he was having difficulty breathing was covered in red splotches.  Apparently he had been swarmed by yellow jackets.  They rushed him to the emergency room where he was treated with steroids and Benadryl.  She notes that the surge of adrenaline that happened when she was stressed about her kids life, she actually started feeling energy again and since then has gradually been able to start getting into doing more walking.  She is starting to eat a little better, but is now more bothered by the dyspnea spells and still has the the rapid heart rate spells.  In the last couple weeks, things have definitely gotten better.  She still coughing, not as much and it thinks is now maybe more related to just her asthma acting up.  She is walking slowly, but has not yet ready to actually get an exercise playing sports with her kids.  She has some exertional dyspnea and when  she has her tachycardia spells which seem to be somewhat postural in nature, she may feel some chest discomfort.   CV Review of Symptoms (Summary) Cardiovascular ROS: positive for - chest pain, dyspnea on exertion, palpitations, rapid heart rate, shortness of breath, and lightheadedness/dizziness but no syncope or near syncope.  Significant exercise intolerance, fatigue negative for - edema, orthopnea, paroxysmal nocturnal dyspnea, or TIA or amaurosis fugax, claudication  REVIEWED OF SYSTEMS   Review of Systems  Constitutional:  Positive for malaise/fatigue and weight loss. Negative for chills and fever.       Slowly improving her energy level.  HENT:  Negative for congestion (No longer).   Respiratory:  Positive for cough, shortness of breath and wheezing. Negative for hemoptysis and sputum production.   Cardiovascular:        Per HPI  Gastrointestinal:  Positive for constipation and heartburn (Chronic). Negative for blood in stool and melena.  Genitourinary:  Negative for hematuria.  Musculoskeletal:  Positive for joint pain and myalgias.       Generalized aching seems to be improving  Neurological:  Positive for dizziness and weakness (Generalized because of deconditioning and weight loss.).  Psychiatric/Behavioral:  Negative for depression (Not depressed, just upset) and memory loss. The patient is nervous/anxious. The patient does not have insomnia (Hypersomnia).    I have reviewed and (if needed) personally updated the patient's problem list, medications, allergies, past medical and surgical history, social and family history.   PAST MEDICAL HISTORY   Past Medical History:  Diagnosis Date   Acute blood loss anemia 10/04/2012   Asthma    exercise induced   Celiac disease    COVID-19 long hauler manifesting chronic palpitations 10/23/2020   Has had prolonged symptoms of chronic cough, dyspnea and fatigue.   Dichorionic diamniotic twin pregnancy in third trimester 11/20/2014    GERD (gastroesophageal reflux disease)    HPV (human papilloma virus) infection    Iron deficiency anemia 11/27/2014   Postpartum care following cesarean delivery of Di-Di twins (8/6) 11/27/2014   Preterm uterine contractions in third trimester, antepartum 11/20/2014   Scarlet fever    hx of    PAST SURGICAL HISTORY   Past Surgical History:  Procedure Laterality Date   CESAREAN SECTION N/A 11/27/2014   Procedure: CESAREAN SECTION;  Surgeon: Brien Few, MD;  Location: [facility];  Service: Obstetrics;  Laterality: N/A;   COLONOSCOPY  CRYOABLATION     LAPAROSCOPIC NISSEN FUNDOPLICATION  1749   polyp     removal 2009   TYMPANOSTOMY TUBE PLACEMENT     UPPER GASTROINTESTINAL ENDOSCOPY     WISDOM TOOTH EXTRACTION      Immunization History  Administered Date(s) Administered   Influenza,inj,Quad PF,6+ Mos 01/05/2014   Pneumococcal Polysaccharide-23 10/04/2012    MEDICATIONS/ALLERGIES   Current Meds  Medication Sig   albuterol (VENTOLIN HFA) 108 (90 Base) MCG/ACT inhaler Inhale into the lungs.   celecoxib (CELEBREX) 100 MG capsule Take 100 mg by mouth 2 (two) times daily.   methocarbamol (ROBAXIN) 500 MG tablet Take 1,000 mg by mouth every 8 (eight) hours as needed.   MYDAYIS 50 MG CP24 Take 1 capsule by mouth every morning.   pantoprazole (PROTONIX) 40 MG tablet TAKE 1 TABLET BY MOUTH TWICE A DAY   traZODone (DESYREL) 50 MG tablet Take 50-100 mg by mouth at bedtime as needed.    Allergies  Allergen Reactions   Codeine Nausea And Vomiting   Gluten Meal Other (See Comments)    Unknown   Other Hives    Cotton seed oil and gluten    SOCIAL HISTORY/FAMILY HISTORY   Reviewed in Epic:   Social History   Tobacco Use   Smoking status: Never   Smokeless tobacco: Never  Vaping Use   Vaping Use: Never used  Substance Use Topics   Alcohol use: Yes    Comment: occ   Drug use: No   Social History   Social History Narrative   She is married mother of 3 children ages 88,  67 and 67.   Family History  Problem Relation Age of Onset   Cancer Mother    CAD Mother    Hypertension Mother    CAD Father 46       Followed by Dr. [name]; ~CABG @ age ~75   Diabetes type II Maternal Grandmother    Colon cancer Maternal Grandfather    Breast cancer Paternal Grandmother    Stroke Paternal Grandmother    Esophageal cancer Neg Hx    Rectal cancer Neg Hx    Stomach cancer Neg Hx     OBJCTIVE -PE, EKG, labs   Wt Readings from Last 3 Encounters:  01/31/21 155 lb 9.6 oz (70.6 kg)  10/26/20 168 lb (76.2 kg)  06/24/19 203 lb (92.1 kg)    Physical Exam: BP 115/76   Pulse 78   Ht 5\' 9"  (1.753 m)   Wt 155 lb 9.6 oz (70.6 kg)   SpO2 98%   BMI 22.98 kg/m  Physical Exam Vitals reviewed.  Constitutional:      General: She is not in acute distress.    Appearance: Normal appearance. She is normal weight. She is not ill-appearing or toxic-appearing.     Comments: Actually looks relatively healthy.  Well-nourished and well-groomed.  HENT:     Head: Normocephalic and atraumatic.  Eyes:     Extraocular Movements: Extraocular movements intact.     Pupils: Pupils are equal, round, and reactive to light.  Neck:     Vascular: No carotid bruit or JVD.  Cardiovascular:     Rate and Rhythm: Normal rate and regular rhythm. Occasional Extrasystoles are present.    Chest Wall: PMI is not displaced.     Pulses: Normal pulses.     Heart sounds: Normal heart sounds. No murmur (Cannot exclude soft systolic murmur.) heard.   No friction rub. No gallop.  Pulmonary:  Effort: Pulmonary effort is normal. No respiratory distress.     Breath sounds: Normal breath sounds. No wheezing, rhonchi or rales.  Abdominal:     General: Abdomen is flat. Bowel sounds are normal. There is no distension.     Palpations: Abdomen is soft. There is no mass.     Tenderness: There is no abdominal tenderness.     Comments: No HSM or bruit  Musculoskeletal:        General: No swelling. Normal  range of motion.     Cervical back: Normal range of motion and neck supple.  Skin:    General: Skin is warm and dry.     Coloration: Skin is not pale.  Neurological:     General: No focal deficit present.     Mental Status: She is alert and oriented to person, place, and time.     Motor: No weakness.     Gait: Gait normal.  Psychiatric:        Mood and Affect: Mood normal.        Behavior: Behavior normal.        Thought Content: Thought content normal.        Judgment: Judgment normal.     Comments: Somewhat anxious     Adult ECG Report  Rate: 78;  Rhythm: normal sinus rhythm and normal axis, intervals and durations. ;   Narrative Interpretation: Normal/stable EKG.  Recent Labs: 11/07/2020 Na+ 143, K+ 5.3, Cl- 106, HCO3-24, BUN 15, Cr 0.75, Glu 87, Ca2+ 9.5; AST 16, ALT 17, AlkP 44; TSH 0.014 CBC: W 7.9, H/H 12.0/36.1, Plt 248   No results found for: CHOL, HDL, LDLCALC, LDLDIRECT, TRIG, CHOLHDL Lab Results  Component Value Date   CREATININE 0.68 10/26/2020   BUN 16 10/26/2020   NA 138 10/26/2020   K 3.9 10/26/2020   CL 106 10/26/2020   CO2 25 10/26/2020   CBC Latest Ref Rng & Units 10/26/2020 11/03/2017 11/27/2014  WBC 4.0 - 10.5 K/uL 5.1 7.9 19.0(H)  Hemoglobin 12.0 - 15.0 g/dL 12.6 12.5 9.7(L)  Hematocrit 36.0 - 46.0 % 37.4 37.8 29.1(L)  Platelets 150 - 400 K/uL 151 202 181    No results found for: HGBA1C Lab Results  Component Value Date   TSH 1.380 01/04/2014    ==================================================  COVID-19 Education: The signs and symptoms of COVID-19 were discussed with the patient and how to seek care for testing (follow up with PCP or arrange E-visit).    I spent a total of 28 minutes with the patient spent in direct patient consultation.  Additional time spent with chart review  / charting (studies, outside notes, etc): 33 min -> ER visit, 3 clinic visits and labs reviewed.  Medical history updated. Total Time: 61 min  Current medicines  are reviewed at length with the patient today.  (+/- concerns) none  This visit occurred during the SARS-CoV-2 public health emergency.  Safety protocols were in place, including screening questions prior to the visit, additional usage of staff PPE, and extensive cleaning of exam room while observing appropriate contact time as indicated for disinfecting solutions.  Notice: This dictation was prepared with Dragon dictation along with smart phrase technology. Any transcriptional errors that result from this process are unintentional and may not be corrected upon review.   Studies Ordered:  Orders Placed This Encounter  Procedures   Cardiopulmonary exercise test   LONG TERM MONITOR (3-14 DAYS)   EKG 12-Lead   ECHOCARDIOGRAM COMPLETE  Patient Instructions / Medication Changes & Studies & Tests Ordered   Patient Instructions  Medication Instructions:  No changes  *If you need a refill on your cardiac medications before your next appointment, please call your pharmacy*   Lab Work: Not needed    Testing/Procedures:  Will be schedule at [facility] has requested that you have an echocardiogram. Echocardiography is a painless test that uses sound waves to create images of your heart. It provides your doctor with information about the size and shape of your heart and how well your heart's chambers and valves are working. This procedure takes approximately one hour. There are no restrictions for this procedure.  Will be mailed to your home Your physician has recommended that you wear a holter monitor 3 days Zio. Holter monitors are medical devices that record the heart's electrical activity. Doctors most often use these monitors to diagnose arrhythmias. Arrhythmias are problems with the speed or rhythm of the heartbeat. The monitor is a small, portable device. You can wear one while you do your normal daily activities. This is usually used to diagnose  what is causing palpitations/syncope (passing out).    Will be schedule at hospital - [facility] [place] has recommended that you have a cardiopulmonary stress test (CPX). CPX testing is a non-invasive measurement of heart and lung function. It replaces a traditional treadmill stress test. This type of test provides a tremendous amount of information that relates not only to your present condition but also for future outcomes. This test combines measurements of you ventilation, respiratory gas exchange in the lungs, electrocardiogram (EKG), blood pressure and physical response before, during, and following an exercise protocol.   Follow-Up: At [facility], you and your health needs are our priority.  As part of our continuing mission to provide you with exceptional heart care, we have created designated Provider Care Teams.  These Care Teams include your primary Cardiologist (physician) and Advanced Practice Providers (APPs -  Physician Assistants and Nurse Practitioners) who all work together to provide you with the care you need, when you need it.     Your next appointment:   7 to 8  week(s)  The format for your next appointment:     Provider:   Glenetta Hew, MD   Other Instructions   You may have Liberal salt intake Please increase  fluid intake - at least 80- 100 oz daily    ZIO XT- Long Term Monitor Instructions   Cheryl Lara, M.D., M.S. Interventional Cardiologist   Pager # [phone] Phone # [phone] [address] [place], [place] [address]   Thank you for choosing Heartcare at [facility]!!

## 2021-01-31 ENCOUNTER — Ambulatory Visit (INDEPENDENT_AMBULATORY_CARE_PROVIDER_SITE_OTHER): Payer: No Typology Code available for payment source | Admitting: Cardiology

## 2021-01-31 ENCOUNTER — Encounter: Payer: Self-pay | Admitting: Cardiology

## 2021-01-31 ENCOUNTER — Other Ambulatory Visit: Payer: Self-pay

## 2021-01-31 ENCOUNTER — Ambulatory Visit (INDEPENDENT_AMBULATORY_CARE_PROVIDER_SITE_OTHER): Payer: No Typology Code available for payment source

## 2021-01-31 DIAGNOSIS — R002 Palpitations: Secondary | ICD-10-CM

## 2021-01-31 DIAGNOSIS — U099 Post covid-19 condition, unspecified: Secondary | ICD-10-CM

## 2021-01-31 DIAGNOSIS — R0609 Other forms of dyspnea: Secondary | ICD-10-CM | POA: Diagnosis not present

## 2021-01-31 DIAGNOSIS — R0789 Other chest pain: Secondary | ICD-10-CM | POA: Diagnosis not present

## 2021-01-31 HISTORY — DX: Post covid-19 condition, unspecified: U09.9

## 2021-01-31 NOTE — Patient Instructions (Addendum)
Medication Instructions:  No changes  *If you need a refill on your cardiac medications before your next appointment, please call your pharmacy*   Lab Work: Not needed    Testing/Procedures:  Will be schedule at [facility] has requested that you have an echocardiogram. Echocardiography is a painless test that uses sound waves to create images of your heart. It provides your doctor with information about the size and shape of your heart and how well your heart's chambers and valves are working. This procedure takes approximately one hour. There are no restrictions for this procedure.  Will be mailed to your home Your physician has recommended that you wear a holter monitor 3 days Zio. Holter monitors are medical devices that record the heart's electrical activity. Doctors most often use these monitors to diagnose arrhythmias. Arrhythmias are problems with the speed or rhythm of the heartbeat. The monitor is a small, portable device. You can wear one while you do your normal daily activities. This is usually used to diagnose what is causing palpitations/syncope (passing out).    Will be schedule at hospital - [facility] [place] has recommended that you have a cardiopulmonary stress test (CPX). CPX testing is a non-invasive measurement of heart and lung function. It replaces a traditional treadmill stress test. This type of test provides a tremendous amount of information that relates not only to your present condition but also for future outcomes. This test combines measurements of you ventilation, respiratory gas exchange in the lungs, electrocardiogram (EKG), blood pressure and physical response before, during, and following an exercise protocol.   Follow-Up: At [facility], you and your health needs are our priority.  As part of our continuing mission to provide you with exceptional heart care, we have created  designated Provider Care Teams.  These Care Teams include your primary Cardiologist (physician) and Advanced Practice Providers (APPs -  Physician Assistants and Nurse Practitioners) who all work together to provide you with the care you need, when you need it.     Your next appointment:   7 to 8  week(s)  The format for your next appointment:     Provider:   Glenetta Hew, MD   Other Instructions   You may have Liberal salt intake Please increase  fluid intake - at least 80- 100 oz daily    ZIO XT- Long Term Monitor Instructions  Your physician has requested you wear a ZIO patch monitor for 3 days.  This is a single patch monitor. [employer] supplies one patch monitor per enrollment. Additional stickers are not available. Please do not apply patch if you will be having a Nuclear Stress Test,  Echocardiogram, Cardiac CT, MRI, or Chest Xray during the period you would be wearing the  monitor. The patch cannot be worn during these tests. You cannot remove and re-apply the  ZIO XT patch monitor.  Your ZIO patch monitor will be mailed 3 day USPS to your address on file. It may take 3-5 days  to receive your monitor after you have been enrolled.  Once you have received your monitor, please review the enclosed instructions. Your monitor  has already been registered assigning a specific monitor serial # to you.  Billing and Patient Assistance Program Information  We have supplied [employer] with any of your insurance information on file for billing purposes. [employer] offers a sliding scale Patient Assistance Program for patients that do not have  insurance, or whose insurance does not completely cover the cost of the ZIO monitor.  You must apply for the Patient Assistance Program to qualify for this discounted rate.  To apply, please call [employer] at [phone], select option 4, select option 2, ask to apply for  Patient Assistance Program. Theodore Demark will ask your household income, and how  many people  are in your household. They will quote your out-of-pocket cost based on that information.  [employer] will also be able to set up a 48-month, interest-free payment plan if needed.  Applying the monitor   Shave hair from upper left chest.  Hold abrader disc by orange tab. Rub abrader in 40 strokes over the upper left chest as  indicated in your monitor instructions.  Clean area with 4 enclosed alcohol pads. Let dry.  Apply patch as indicated in monitor instructions. Patch will be placed under collarbone on left  side of chest with arrow pointing upward.  Rub patch adhesive wings for 2 minutes. Remove white label marked "1". Remove the white  label marked "2". Rub patch adhesive wings for 2 additional minutes.  While looking in a mirror, press and release button in center of patch. A small green light will  flash 3-4 times. This will be your only indicator that the monitor has been turned on.  Do not shower for the first 24 hours. You may shower after the first 24 hours.  Press the button if you feel a symptom. You will hear a small click. Record Date, Time and  Symptom in the Patient Logbook.  When you are ready to remove the patch, follow instructions on the last 2 pages of Patient  Logbook. Stick patch monitor onto the last page of Patient Logbook.  Place Patient Logbook in the blue and white box. Use locking tab on box and tape box closed  securely. The blue and white box has prepaid postage on it. Please place it in the mailbox as  soon as possible. Your physician should have your test results approximately 7 days after the  monitor has been mailed back to [facility].  Call [place] at [phone] if you have questions regarding  your ZIO XT patch monitor. Call them immediately if you see an orange light blinking on your  monitor.  If your monitor falls off in less than 4 days, contact our Monitor department at [phone].  If your monitor becomes  loose or falls off after 4 days call [employer] at [phone] for  suggestions on securing your monitor

## 2021-01-31 NOTE — Progress Notes (Unsigned)
Enrolled patient for a 3 day Zio XT monitor to be mailed to patients home  

## 2021-02-02 ENCOUNTER — Encounter: Payer: Self-pay | Admitting: Cardiology

## 2021-02-02 NOTE — Assessment & Plan Note (Signed)
Chronic dyspnea and palpitations etc.  No clear-cut answers to what was posterior to follow this up, but need to exclude potential for myocarditis with reduced EF or or other valvular disease.  With palpitations, will order a monitor.  Also to evaluate dyspnea we will order CPX.   Plan: 2D echo, 3-day Zio patch monitor, CPX; gradually build back exercise based on CPX results.  Liberalize fluid intake as well as salt intake.

## 2021-02-02 NOTE — Assessment & Plan Note (Signed)
Episodes sound somewhat concerning for postural hypotension.  I wonder if this is probably because she is not eating and drinking so well.  She says she is trying to drink maybe 64 ounces of water a day, but not eating that much.   Plan:  3-day Zio patch to assess for arrhythmia versus simply sinus tachycardia.  Liberalize fluid and salt intake-increase to 80-100 ounces of water a day  Check echocardiogram to exclude structural abnormality.  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

## 2021-02-02 NOTE — Assessment & Plan Note (Signed)
Somewhat atypical in nature, does not really sound anginal, but is associate with tachycardia.  Need to exclude structural abnormality and ischemia.  Plan: 2D Echocardiogram and CPX.

## 2021-02-02 NOTE — Assessment & Plan Note (Signed)
I suspect her exertional dyspnea is related to deconditioning with basely being out of commission for 2 months.  This is in combination of recovering from her respiratory infection.  Says she is having chest pain and palpitations and is concerned for long-haul syndrome from COVID we will evaluate cardiac function and structure with a 2D echocardiogram.  To determine cardiac versus pulmonary etiology versus simply deconditioning we will also check A CPX (Cardiopulmonary Exercise Stress Test)  Shared Decision Making/Informed Consent{ The risks [chest pain, shortness of breath, cardiac arrhythmias, dizziness, blood pressure fluctuations, myocardial infarction, stroke/transient ischemic attack, and life-threatening complications (estimated to be 1 in 10,000)], benefits (risk stratification, diagnosing coronary artery disease, treatment guidance) and alternatives of an exercise tolerance test were discussed in detail with Cheryl Lara and she agrees to proceed.

## 2021-02-03 DIAGNOSIS — R0789 Other chest pain: Secondary | ICD-10-CM

## 2021-02-03 DIAGNOSIS — U099 Post covid-19 condition, unspecified: Secondary | ICD-10-CM

## 2021-02-03 DIAGNOSIS — R002 Palpitations: Secondary | ICD-10-CM

## 2021-02-03 DIAGNOSIS — R0609 Other forms of dyspnea: Secondary | ICD-10-CM | POA: Diagnosis not present

## 2021-02-09 ENCOUNTER — Ambulatory Visit: Payer: No Typology Code available for payment source | Admitting: Neurology

## 2021-02-10 ENCOUNTER — Other Ambulatory Visit: Payer: Self-pay

## 2021-02-10 ENCOUNTER — Ambulatory Visit (HOSPITAL_COMMUNITY): Payer: No Typology Code available for payment source | Attending: Cardiology

## 2021-02-10 DIAGNOSIS — R0789 Other chest pain: Secondary | ICD-10-CM | POA: Diagnosis not present

## 2021-02-10 DIAGNOSIS — R002 Palpitations: Secondary | ICD-10-CM | POA: Insufficient documentation

## 2021-02-10 DIAGNOSIS — U099 Post covid-19 condition, unspecified: Secondary | ICD-10-CM | POA: Diagnosis not present

## 2021-02-10 DIAGNOSIS — R0609 Other forms of dyspnea: Secondary | ICD-10-CM | POA: Diagnosis not present

## 2021-02-10 DIAGNOSIS — I341 Nonrheumatic mitral (valve) prolapse: Secondary | ICD-10-CM | POA: Insufficient documentation

## 2021-02-10 HISTORY — PX: TRANSTHORACIC ECHOCARDIOGRAM: SHX275

## 2021-02-10 LAB — ECHOCARDIOGRAM COMPLETE
Area-P 1/2: 4.06 cm2
S' Lateral: 2.9 cm

## 2021-02-13 ENCOUNTER — Other Ambulatory Visit: Payer: Self-pay

## 2021-02-13 ENCOUNTER — Ambulatory Visit (HOSPITAL_COMMUNITY): Payer: No Typology Code available for payment source | Attending: Cardiology

## 2021-02-13 DIAGNOSIS — R0609 Other forms of dyspnea: Secondary | ICD-10-CM

## 2021-02-13 DIAGNOSIS — R002 Palpitations: Secondary | ICD-10-CM | POA: Diagnosis not present

## 2021-02-13 DIAGNOSIS — R0789 Other chest pain: Secondary | ICD-10-CM | POA: Insufficient documentation

## 2021-02-13 DIAGNOSIS — R0600 Dyspnea, unspecified: Secondary | ICD-10-CM | POA: Diagnosis not present

## 2021-02-13 DIAGNOSIS — U099 Post covid-19 condition, unspecified: Secondary | ICD-10-CM | POA: Diagnosis not present

## 2021-02-13 HISTORY — PX: OTHER SURGICAL HISTORY: SHX169

## 2021-02-20 ENCOUNTER — Telehealth: Payer: Self-pay | Admitting: *Deleted

## 2021-02-20 NOTE — Telephone Encounter (Signed)
Attempted to call pt. No answer. Lmtcb.  

## 2021-02-20 NOTE — Telephone Encounter (Signed)
-----   Message from Leonie Man, MD sent at 02/18/2021  6:47 PM EDT ----- Cardiopulmonary stress test results:  Normal functional capacity compared to age matched sedentary norms.  No evidence of cardiopulmonary abnormality and no evidence of exercise-induced bronchospasm.  Confirmed by Dr. Simone Curia, MD

## 2021-02-21 ENCOUNTER — Other Ambulatory Visit: Payer: Self-pay | Admitting: Gastroenterology

## 2021-02-21 NOTE — Telephone Encounter (Signed)
Not a [employer] patient. Result note was also forwarded to Dr. Darcus Pester nurse Trixie Dredge. Closing encounter.

## 2021-02-23 ENCOUNTER — Encounter: Payer: Self-pay | Admitting: *Deleted

## 2021-02-27 ENCOUNTER — Encounter: Payer: Self-pay | Admitting: Psychiatry

## 2021-02-27 ENCOUNTER — Ambulatory Visit (INDEPENDENT_AMBULATORY_CARE_PROVIDER_SITE_OTHER): Payer: No Typology Code available for payment source | Admitting: Psychiatry

## 2021-02-27 VITALS — BP 111/71 | HR 78 | Ht 69.0 in | Wt 155.0 lb

## 2021-02-27 DIAGNOSIS — U099 Post covid-19 condition, unspecified: Secondary | ICD-10-CM

## 2021-02-27 DIAGNOSIS — M542 Cervicalgia: Secondary | ICD-10-CM

## 2021-02-27 DIAGNOSIS — R519 Headache, unspecified: Secondary | ICD-10-CM | POA: Diagnosis not present

## 2021-02-27 DIAGNOSIS — G8929 Other chronic pain: Secondary | ICD-10-CM | POA: Diagnosis not present

## 2021-02-27 MED ORDER — PROPRANOLOL HCL 20 MG PO TABS: 20.0000 mg | ORAL_TABLET | Freq: Two times a day (BID) | ORAL | 2 refills | Status: DC

## 2021-02-27 MED ORDER — ELETRIPTAN HYDROBROMIDE 40 MG PO TABS: 40.0000 mg | ORAL_TABLET | ORAL | 2 refills | Status: DC | PRN

## 2021-02-27 NOTE — Patient Instructions (Addendum)
Physical therapy for the neck 2.   Supplements such as magnesium and riboflavin daily can help reduce headaches 3.   Start propranolol 20 mg twice a day for headache prevention 4.   Start eletriptan (Relpax) as needed. Take at the onset of migraine. If headache recurs or does not fully resolve, you may take a second dose after 2 hours. Please avoid taking more than 2 days per week or 10 days per month.   GENERAL HEADACHE INFORMATION  Headache Preventive Treatment: Please keep in mind that it takes 4-6 weeks for the medication to start working well and 2-3 months at the appropriate dose before deciding if it will be useful or not. If it is not helping at all by this time, then we will discuss other medications to try. Supplements may take 3-6 months until you see full effect.   Natural supplements: Magnesium Oxide or Magnesium Glycinate 500 mg at bed (up to 800 mg daily) Coenzyme Q10 300 mg in AM Vitamin B2- 200 mg twice a day  Add 1 supplement at a time since even natural supplements can have undesirable side effects. You can sometimes buy supplements cheaper (especially Coenzyme Q10) at www.https://compton-perez.com/ or at [employer].  Vitamins and herbs that show potential:  Magnesium: Magnesium (250 mg twice a day or 500 mg at bed) has a relaxant effect on smooth muscles such as blood vessels. Individuals suffering from frequent or daily headache usually have low magnesium levels which can be increase with daily supplementation of 400-750 mg. Three trials found 40-90% average headache reduction  when used as a preventative. Magnesium also demonstrated the benefit in menstrually related migraine.  Magnesium is part of the messenger system in the serotonin cascade and it is a good muscle relaxant.  It is also useful for constipation which can be a side effect of other medications used to treat migraine. Good sources include nuts, whole grains, and tomatoes. Side Effects: loose stool/diarrhea Riboflavin (vitamin B  2) 200 mg twice a day. This vitamin assists nerve cells in the production of ATP a principal energy storing molecule.  It is necessary for many chemical reactions in the body.  There have been at least 3 clinical trials of riboflavin using 400 mg per day all of which suggested that migraine frequency can be decreased.  All 3 trials showed significant improvement in over half of migraine sufferers.  The supplement is found in bread, cereal, milk, meat, and poultry.  Most Americans get more riboflavin than the recommended daily allowance, however riboflavin deficiency is not necessary for the supplements to help prevent headache. Side effects: energizing, green urine  Coenzyme Q10: This is present in almost all cells in the body and is critical component for the conversion of energy.  Recent studies have shown that a nutritional supplement of CoQ10 can reduce the frequency of migraine attacks by improving the energy production of cells as with riboflavin.  Doses of 150 mg twice a day have been shown to be effective.  Melatonin: Increasing evidence shows correlation between melatonin secretion and headache conditions.  Melatonin supplementation has decreased headache intensity and duration.  It is widely used as a sleep aid.  Sleep is natures way of dealing with migraine.  A dose of 3 mg is recommended to start for headaches including cluster headache. Higher doses up to 15 mg has been reviewed for use in Cluster headache and have been used. The rationale behind using melatonin for cluster is that many theories regarding the cause of  Cluster headache center around the disruption of the normal circadian rhythm in the brain.  This helps restore the normal circadian rhythm.  Ginger: Ginger has a small amount of antihistamine and anti-inflammatory action which may help headache.  It is primarily used for nausea and may aid in the absorption of other medications. HEADACHE DIET: Foods and beverages which may trigger  migraine Note that only 20% of headache patients are food sensitive. You will know if you are food sensitive if you get a headache consistently 20 minutes to 2 hours after eating a certain food. Only cut out a food if it causes headaches, otherwise you might remove foods you enjoy! What matters most for diet is to eat a well balanced healthy diet full of vegetables and low fat protein, and to not miss meals.  Chocolate, other sweets ALL cheeses except cottage and cream cheese Dairy products, yogurt, sour cream, ice cream Liver Meat extracts (Bovril, Marmite, meat tenderizers) Meats or fish which have undergone aging, fermenting, pickling or smoking. These include: Hotdogs,salami,Lox,sausage, mortadellas,smoked salmon, pepperoni, Pickled herring Pods of broad bean (English beans, Chinese pea pods, [place] (fava) beans, lima and navy beans Ripe avocado, ripe banana Yeast extracts or active yeast preparations such as Brewer's or Fleishman's (commercial bakes goods are permitted) Tomato based foods, pizza (lasagna, etc.)  MSG (monosodium glutamate) is disguised as many things; look for these common aliases: Monopotassium glutamate Autolysed yeast Hydrolysed protein Sodium caseinate "flavorings" "all natural preservatives" Nutrasweet  Avoid all other foods that convincingly provoke headaches.  Resources: The Dizzy Lu Duffel Your Headache Diet, migrainestrong.com  https://www.aguirre.org/  Caffeine and Migraine For patients that have migraine, caffeine intake more than 3 days per week can lead to dependency and increased migraine frequency. I would recommend cutting back on your caffeine intake as best you can. The recommended amount of caffeine is 200-300 mg daily, although migraine patients may experience dependency at even lower doses. While you may notice an increase in headache temporarily, cutting back will be helpful for headaches in  the long run. For more information on caffeine and migraine, visit: https://americanmigrainefoundation.org/resource-library/caffeine-and-migraine/  Headache Prevention Strategies:  1. Maintain a headache diary; learn to identify and avoid triggers.  - This can be a simple note where you log when you had a headache, associated symptoms, and medications used - There are several smartphone apps developed to help track migraines: Migraine Buddy, Migraine Monitor, Curelator N1-Headache App  Common triggers include: Emotional triggers: Emotional/Upset family or friends Emotional/Upset occupation Business reversal/success Anticipation anxiety Crisis-serious Post-crisis periodNew job/position   Physical triggers: Vacation Day Weekend Strenuous Exercise High Altitude Location New Move Menstrual Day Physical Illness Oversleep/Not enough sleep Weather changes Light: Photophobia or light sesnitivity treatment involves a balance between desensitization and reduction in overly strong input. Use dark polarized glasses outside, but not inside. Avoid bright or fluorescent light, but do not dim environment to the point that going into a normally lit room hurts. Consider FL-41 tint lenses, which reduce the most irritating wavelengths without blocking too much light.  These can be obtained at axonoptics.com or theraspecs.com Foods: see list above.  2. Limit use of acute treatments (over-the-counter medications, triptans, etc.) to no more than 2 days per week or 10 days per month to prevent medication overuse headache (rebound headache).    3. Follow a regular schedule (including weekends and holidays): Don't skip meals. Eat a balanced diet. 8 hours of sleep nightly. Minimize stress. Exercise 30 minutes per day. Being overweight is associated with a  5 times increased risk of chronic migraine. Keep well hydrated and drink 6-8 glasses of water per day.  4. Initiate non-pharmacologic measures at the  earliest onset of your headache. Rest and quiet environment. Relax and reduce stress. Breathe2Relax is a free app that can instruct you on    some simple relaxtion and breathing techniques. Http://Dawnbuse.com is a    free website that provides teaching videos on relaxation.  Also, there are  many apps that   can be downloaded for "mindful" relaxation.  An app called YOGA NIDRA will help walk you through mindfulness. Another app called Calm can be downloaded to give you a structured mindfulness guide with daily reminders and skill development. Headspace for guided meditation Mindfulness Based Stress Reduction Online Course: www.palousemindfulness.com Cold compresses.  5. Don't wait!! Take the maximum allowable dosage of prescribed medication at the first sign of migraine.  6. Compliance:  Take prescribed medication regularly as directed and at the first sign of a migraine.  7. Communicate:  Call your physician when problems arise, especially if your headaches change, increase in frequency/severity, or become associated with neurological symptoms (weakness, numbness, slurred speech, etc.).  8. Headache/pain management therapies: Consider various complementary methods, including medication, behavioral therapy, psychological counselling, biofeedback, massage therapy, acupuncture, dry needling, and other modalities.  Such measures may reduce the need for medications. Counseling for pain management, where patients learn to function and ignore/minimize their pain, seems to work very well.  9. Recommend changing family's attention and focus away from patient's headaches. Instead, emphasize daily activities. If first question of day is 'How are your headaches/Do you have a headache today?', then patient will constantly think about headaches, thus making them worse. Goal is to re-direct attention away from headaches, toward daily activities and other distractions.  10. Helpful  Websites: www.AmericanHeadacheSociety.org VoipObserver.it www.headaches.org GolfingFamily.no www.achenet.org

## 2021-02-27 NOTE — Progress Notes (Signed)
Referring:  Cleopatra Cedar, MD [address] [place],  [place] [address]  PCP: [name], MD  Neurology was asked to evaluate Cheryl Lara, a 40 year old female for a chief complaint of headaches.  Our recommendations of care will be communicated by shared medical record.    CC:  headaches  HPI:  Medical co-morbidities: GERD  The patient presents for evaluation of constant headaches which began after a COVID infection in June 2022. She also has persistent photophobia and phonophobia. Is not driving with her children in the car because she is worried about safety. Also notes new intermittent tremors in both hands L>R. Tremor is more prominent when she is performing actions and is not present at rest. She does have a family history of tremor.  Headaches are described as occipital throbbing and sharp pain radiating upwards to her eyes. They are associated with phonophobia, photophobia, and nausea. They have improved in severity over time but can still get up to 10/10 if she overexerts herself. Headaches will get up to 10/10 once every couple of weeks.  She is taking robaxin most days which does help reduce her headache severity. Celebrex does not make much of a difference.  [place] with contrast 11/18/20 was unremarkable  She also has been having persistent palpitations since COVID. Stress test was normal but she was found to have diastolic dysfunction on TTE.  Headache History: Onset: June 2022 Triggers: sugar, moving neck Aura: floaters lasting less than one minute Location: occipital Quality/Description: throbbing, sharp, pressure Severity: 10/10 Associated Symptoms:  Photophobia: yes  Phonophobia: yes  Nausea: yes Vomiting: no Other symptoms: dizziness, brain fog Worse with activity?: yes Duration of headaches: constant   Headache days per month: 30 Headache free days per month: 0  Current Treatment: Abortive Robaxin celebrex  Preventative none  Prior Therapies                                  Robaxin  celebrex   Headache Risk Factors: Headache risk factors and/or co-morbidities (+) Neck Pain (+) Sleep Disorder - trouble sleeping at night due to headaches, recently started trazodone (-) History of Traumatic Brain Injury and/or Concussion  LABS: CBC    Component Value Date/Time   WBC 5.1 10/26/2020 1247   RBC 3.81 (L) 10/26/2020 1247   HGB 12.6 10/26/2020 1247   HCT 37.4 10/26/2020 1247   PLT 151 10/26/2020 1247   MCV 98.2 10/26/2020 1247   MCH 33.1 10/26/2020 1247   MCHC 33.7 10/26/2020 1247   RDW 12.5 10/26/2020 1247   LYMPHSABS 1.4 10/26/2020 1247   MONOABS 0.4 10/26/2020 1247   EOSABS 0.1 10/26/2020 1247   BASOSABS 0.0 10/26/2020 1247   BMP Latest Ref Rng & Units 10/26/2020 11/03/2017 01/04/2014  Glucose 70 - 99 mg/dL 85 89 79  BUN 6 - 20 mg/dL 16 14 13   Creatinine 0.44 - 1.00 mg/dL 0.68 0.78 0.80  Sodium 135 - 145 mmol/L 138 137 137  Potassium 3.5 - 5.1 mmol/L 3.9 3.6 4.3  Chloride 98 - 111 mmol/L 106 108 105  CO2 22 - 32 mmol/L 25 23 22   Calcium 8.9 - 10.3 mg/dL 8.6(L) 8.8(L) 8.5     IMAGING:  CTH 11/18/20: unremarkable   Current Outpatient Medications on File Prior to Visit  Medication Sig Dispense Refill   albuterol (VENTOLIN HFA) 108 (90 Base) MCG/ACT inhaler Inhale into the lungs as needed.  celecoxib (CELEBREX) 100 MG capsule Take 100 mg by mouth as needed.     methocarbamol (ROBAXIN) 500 MG tablet Take 1,000 mg by mouth every 8 (eight) hours as needed.     MYDAYIS 50 MG CP24 Take 1 capsule by mouth every morning.     pantoprazole (PROTONIX) 40 MG tablet TAKE 1 TABLET BY MOUTH TWICE A DAY 180 tablet 0   traZODone (DESYREL) 50 MG tablet Take 50-100 mg by mouth at bedtime as needed.     No current facility-administered medications on file prior to visit.     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   Dichorionic diamniotic twin pregnancy in third trimester 11/20/2014   GERD (gastroesophageal reflux disease)    HPV (human papilloma virus) infection    Iron deficiency anemia 11/27/2014   Postpartum care following cesarean delivery of Di-Di twins (8/6) 11/27/2014   Preterm uterine contractions in third trimester, antepartum 11/20/2014   Scarlet fever    hx of    Past Surgical History Past Surgical History:  Procedure Laterality Date   CESAREAN SECTION N/A 11/27/2014   Procedure: CESAREAN SECTION;  Surgeon: Brien Few, MD;  Location: [facility];  Service: Obstetrics;  Laterality: N/A;   COLONOSCOPY     CRYOABLATION     LAPAROSCOPIC NISSEN FUNDOPLICATION  0867   polyp     removal 2009   TYMPANOSTOMY TUBE PLACEMENT     UPPER GASTROINTESTINAL ENDOSCOPY     WISDOM TOOTH EXTRACTION      Social History: Social History   Tobacco Use   Smoking status: Never   Smokeless tobacco: Never  Vaping Use   Vaping Use: Never used  Substance Use Topics   Alcohol use: Yes    Comment: occ   Drug use: No    ROS: Negative for fevers, chills. Positive for headaches, tremor. All other systems reviewed and negative unless stated otherwise in HPI.   Physical Exam:   Vital Signs: BP 111/71   Pulse 78   Ht 5\' 9"  (1.753 m)   Wt 155 lb (70.3 kg)   BMI 22.89 kg/m  GENERAL: well appearing,in no acute distress,alert SKIN:  Color, texture, turgor normal. No rashes or  lesions HEAD:  Normocephalic/atraumatic. CV:  RRR RESP: Normal respiratory effort MSK: +tenderness to palpation over occiput bilaterally  NEUROLOGICAL: Mental Status: Alert, oriented to person, place and time,Follows commands Cranial Nerves: PERRL,visual fields intact to confrontation,extraocular movements intact,facial sensation intact,no facial droop or ptosis,hearing intact to finger rub bilaterally,no dysarthria,palate elevate symmetrically,tongue protrudes midline,shoulder shrug intact and symmetric Motor: muscle strength 5/5 both upper and lower extremities,no drift, normal tone Reflexes: 2+ throughout Sensation: intact to light touch all 4 extremities Coordination: Finger-to- nose-finger intact bilaterally. Intermittent tremor of variable frequency and amplitude on left. Gait: normal-based  IMPRESSION: 40 year old female who presents for evaluation of headaches and tremor following COVID infection. Her headaches do have features of migraine including photophobia, phonophobia, and nausea. Will start propranolol which can help with headaches, tremors, and palpitations. Will start Relpax for rescue. 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 Referral to neck PT placed.  PLAN: -Preventive: Start propranolol 20 mg BID -Rescue: Start Relpax 40 mg PRN -Referral to neck PT -next steps: consider Topamax   I spent a total of 47 minutes chart reviewing and counseling the patient. Headache education was done. Discussed treatment options including preventive and acute medications, natural supplements, and physical therapy. Discussed medication overuse headache and to limit use of acute treatments to no more than 2 days/week or 10 days/month. Discussed medication side effects, adverse reactions and drug interactions. Written educational materials and patient instructions outlining all of the above were given.  Follow-up: 3 months   Genia Harold, MD 02/27/2021   11:08 AM

## 2021-03-01 ENCOUNTER — Telehealth: Payer: Self-pay | Admitting: Psychiatry

## 2021-03-01 NOTE — Telephone Encounter (Signed)
PT referral sent to [facility] in [place]. Phone: [phone].

## 2021-03-20 ENCOUNTER — Ambulatory Visit: Payer: No Typology Code available for payment source | Admitting: Psychiatry

## 2021-03-23 ENCOUNTER — Ambulatory Visit (INDEPENDENT_AMBULATORY_CARE_PROVIDER_SITE_OTHER): Payer: No Typology Code available for payment source | Admitting: Cardiology

## 2021-03-23 ENCOUNTER — Encounter: Payer: Self-pay | Admitting: Cardiology

## 2021-03-23 ENCOUNTER — Other Ambulatory Visit: Payer: Self-pay

## 2021-03-23 VITALS — BP 110/70 | HR 80 | Resp 20 | Ht 69.0 in | Wt 155.0 lb

## 2021-03-23 DIAGNOSIS — R0609 Other forms of dyspnea: Secondary | ICD-10-CM | POA: Diagnosis not present

## 2021-03-23 DIAGNOSIS — I341 Nonrheumatic mitral (valve) prolapse: Secondary | ICD-10-CM

## 2021-03-23 DIAGNOSIS — R002 Palpitations: Secondary | ICD-10-CM

## 2021-03-23 DIAGNOSIS — R0789 Other chest pain: Secondary | ICD-10-CM

## 2021-03-23 MED ORDER — PROPRANOLOL HCL 20 MG PO TABS: ORAL_TABLET | ORAL | Status: DC

## 2021-03-23 MED ORDER — PROPRANOLOL HCL 20 MG PO TABS: ORAL_TABLET | ORAL | 2 refills | Status: DC

## 2021-03-23 NOTE — Progress Notes (Signed)
Primary Care Provider: [name], MD [facility] Cardiologist: Glenetta Hew, MD Electrophysiologist: None  Clinic Note: Chief Complaint  Patient presents with   Follow-up    Test results.  Still has some chest discomfort, exertional dyspnea and fatigue, but better.  Palpitations also better.  Did not tolerate full dose of Inderal.  Had a headache   ===================================  ASSESSMENT/PLAN   Problem List Items Addressed This Visit       Cardiology Problems   Mitral valve anterior leaflet prolapse (Chronic)    Mild MVP noted on echo.  No significant MR.  Reassess in 2 to 3 years depending on symptoms and +/- murmur.      Relevant Medications   propranolol (INDERAL) 20 MG tablet     Other   Chest pressure    Normal echo and CPX.  Likely not anginal in nature.      Relevant Orders   EKG 12-Lead (Completed)   DOE (dyspnea on exertion)    Echo is normal.  CPX also normal.  Likely simply related to fatigue/deconditioning.      Relevant Orders   EKG 12-Lead (Completed)   Rapid palpitations - Primary (Chronic)    Simply noted sinus tachycardia on monitor.  1 atrial run noted.  Otherwise nothing significant.  She did not tolerate full dose of propranolol.  Like to see if she can potentially get on a little bit.  We will start off with 1/4 tablet of propranolol) which is 5 mg) twice daily.  Continue with hydration, and liberalization of salt.  Mild prolapse noted, but I do not think this has any palpitations.      Relevant Orders   EKG 12-Lead (Completed)   ===================================  HPI:    Cheryl Lara is a 40 y.o. female who is being seen today for the evaluation of Chest Pain and Palpitations at the request of [name], MD.  Recent Hospitalizations:  10/26/2020: ER visit with leg pain.  Was having symptoms of COVID since July of 3rd -> was given Inhaler & Paxlovid. Fever was waning.  Work-up was negative.  Discharged home.  Cheryl Lara was seen for initial consult on 01/31/2021 at the request of her PCP for palpitations.  Her mother is a long-term patient of mine.  Most of her symptoms began since having COVID-19 in July 2022.  Prior to this, she was doing fine with no major issues.  Some mild exertional dyspnea because of asthma but otherwise only a few skipped beats here and there.  Nothing prolonged.  Unfortunately, she has been having significant "long-haul "symptoms of COVID.  Everyone else in the family recovered but she never got over the chronic fatigue, cough and dyspnea.  She is gradually starting to feel better and started to get more active, but was still having significant palpitations and fatigue.  No energy.  Sleeping all day-unable to keep up with kids.  She also started noticing having tachycardic spells with heart rate ranging in the 120s -140s just giving him to go walking.  Would also take a long time to go back down to normal.  For a while while had not been eating and drinking well, but has subsequently gotten better. She noticed that in the few weeks leading up to the clinic visit, she has been feeling somewhat better, still has coughing from her likely asthma.  Walking slowly but not yet able to keep up with kids.  Worsening dyspnea with tachycardia spells. We ordered  an event monitor, CPX and echocardiogram-reviewed below.  Reviewed  CV studies:    The following studies were reviewed today: (if available, images/films reviewed: From Epic Chart or Care Everywhere) Echo 02/10/2021: EF 60 to 65%.  No R WMA.  GR 1 DD?Marland Kitchen  Normal RV size and function.  Normal aortic valve.  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 CPX (02/13/2021): Normal functional capacity.  No cardiopulmonary abnormalities.  No evidence of any exercise-induced bronchospasm Cardiac event monitor: Patch Wear Time:  3 days and 23 hours (2022-10-14T19:21:12-398 to 2022-10-18T18:35:54-0400)   Patient had a min HR of 56 bpm,  max HR of 152 bpm, and avg HR of 93 bpm. Predominant underlying rhythm was Sinus Rhythm. Slight P wave morphology changes were noted. 1 run of Supraventricular Tachycardia occurred lasting 4 beats with a max rate of 128 bpm (avg 122 bpm). Isolated SVEs were rare (<1.0%), SVE Couplets were rare (<1.0%), and no SVE Triplets were present. Isolated VEs were rare (<1.0%), and no VE Couplets or VE Triplets were present.   Interval History:   Cheryl Lara is here today overall feeling better.  She does feel a bit less tired as long as she gets enough sleep.  She still somewhat worn out and has exertional dyspnea.  She stopped taking propranolol thinking that it is causing her headache.   She says at least once a day she will have an episode of chest discomfort which is often right-sided.  She still has palpitations and has been drinking at least 70 to 80 ounces of water a day.  Try to increase it further.  CV Review of Symptoms (Summary) Cardiovascular ROS: positive for - chest pain, dyspnea on exertion, palpitations, rapid heart rate, and lightheadedness/dizziness but no syncope or near syncope.  Improved exercise intolerance, fatigue; the chest discomfort and dyspnea seems to be better if not stable.  Also has headaches. negative for - edema, orthopnea, paroxysmal nocturnal dyspnea, or TIA or amaurosis fugax, claudication  REVIEWED OF SYSTEMS   Review of Systems  Constitutional:  Positive for malaise/fatigue (Still feels tired and worn out, but doing better.). Negative for chills, fever and weight loss.       Slowly improving her energy level.  HENT:  Negative for congestion (No longer).   Eyes:  Negative for blurred vision.  Respiratory:  Positive for shortness of breath (Still persistent, better.). Negative for cough, hemoptysis, sputum production and wheezing.   Cardiovascular:        Per HPI  Gastrointestinal:  Positive for constipation and heartburn (Chronic). Negative for blood in stool and  melena.  Genitourinary:  Negative for hematuria.  Musculoskeletal:  Positive for joint pain and myalgias.       Generalized aching seems to be improving  Neurological:  Positive for dizziness, weakness (Generalized because of deconditioning and weight loss.) and headaches.  Psychiatric/Behavioral:  Negative for depression (Not depressed, just upset) and memory loss. The patient is nervous/anxious. The patient does not have insomnia (Hypersomnia).    I have reviewed and (if needed) personally updated the patient's problem list, medications, allergies, past medical and surgical history, social and family history.   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   Dichorionic diamniotic twin pregnancy in third trimester 11/20/2014   GERD (gastroesophageal reflux disease)    HPV (human papilloma virus) infection    Iron deficiency anemia 11/27/2014   Postpartum care following cesarean delivery of Di-Di twins (8/6) 11/27/2014   Preterm uterine contractions in third trimester, antepartum 11/20/2014   Scarlet fever    hx of    PAST SURGICAL HISTORY   Past Surgical History:  Procedure Laterality Date   CARDIOPULMONARY EXERCISE TEST (CPX)  02/13/2021   Normal functional capacity.  No cardiopulmonary abnormalities.  No evidence of any exercise-induced bronchospasm   CESAREAN SECTION N/A 11/27/2014   Procedure: CESAREAN SECTION;  Surgeon: Brien Few, MD;  Location: [facility];  Service: Obstetrics;  Laterality: N/A;   COLONOSCOPY     CRYOABLATION     LAPAROSCOPIC NISSEN FUNDOPLICATION  0938   polyp     removal 2009   TRANSTHORACIC ECHOCARDIOGRAM  02/10/2021   EF 60 to 65%.  No R WMA.  GR 1 DD?Marland Kitchen  Normal RV size and  function.  Normal aortic valve.  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   TYMPANOSTOMY TUBE PLACEMENT     UPPER GASTROINTESTINAL ENDOSCOPY     WISDOM TOOTH EXTRACTION      Immunization History  Administered Date(s) Administered   Influenza,inj,Quad PF,6+ Mos 01/05/2014   Pneumococcal Polysaccharide-23 10/04/2012    MEDICATIONS/ALLERGIES   Current Meds  Medication Sig   albuterol (VENTOLIN HFA) 108 (90 Base) MCG/ACT inhaler Inhale into the lungs as needed.   eletriptan (RELPAX) 40 MG tablet Take 1 tablet (40 mg total) by mouth as needed for migraine or headache. May repeat in 2 hours if headache persists or recurs.   methocarbamol (ROBAXIN) 500 MG tablet Take 1,000 mg by mouth every 8 (eight) hours as needed.   MYDAYIS 50 MG CP24 Take 1 capsule by mouth every morning.   pantoprazole (PROTONIX) 40 MG tablet TAKE 1 TABLET BY MOUTH TWICE A DAY   propranolol (INDERAL) 20 MG tablet Take 1/4 of a 20 mg tablet twice a day   traZODone (DESYREL) 50 MG tablet Take 50-100 mg by mouth at bedtime as needed.    Allergies  Allergen Reactions   Codeine Nausea And Vomiting   Gluten Meal Other (See Comments)    Unknown   Other Hives    Cotton seed oil and gluten   Nickel Dermatitis, Itching and Rash    SOCIAL HISTORY/FAMILY HISTORY   Reviewed in Epic:   Social History   Tobacco Use   Smoking status: Never   Smokeless tobacco: Never  Vaping Use   Vaping Use: Never used  Substance Use Topics   Alcohol use: Yes    Comment: occ   Drug use: No   Social History   Social History Narrative   She is married, mother of 3 children ages 43, 14 and 65.   Family History  Problem Relation Age of Onset   Cancer Mother    CAD Mother    Hypertension Mother    Hypertension Father    CAD Father 30       Followed by Dr. [name]; ~CABG @ age ~65   Cancer Maternal Grandmother    Diabetes Maternal Grandmother    Diabetes type II Maternal Grandmother    Colon cancer Maternal  Grandfather    Breast cancer Paternal Grandmother    Stroke Paternal Grandmother    Esophageal cancer Neg Hx    Rectal  cancer Neg Hx    Stomach cancer Neg Hx     OBJCTIVE -PE, EKG, labs   Wt Readings from Last 3 Encounters:  03/23/21 155 lb (70.3 kg)  02/27/21 155 lb (70.3 kg)  01/31/21 155 lb 9.6 oz (70.6 kg)    Physical Exam: BP 110/70 (BP Location: Left Arm, Patient Position: Sitting, Cuff Size: Normal)   Pulse 80   Resp 20   Ht 5\' 9"  (1.753 m)   Wt 155 lb (70.3 kg)   SpO2 96%   BMI 22.89 kg/m  Physical Exam Vitals reviewed.  Constitutional:      General: She is not in acute distress.    Appearance: Normal appearance. She is normal weight. She is not ill-appearing or toxic-appearing.     Comments: Actually looks relatively healthy.  Well-nourished and well-groomed.  HENT:     Head: Normocephalic and atraumatic.  Neck:     Vascular: No carotid bruit or JVD.  Cardiovascular:     Rate and Rhythm: Normal rate and regular rhythm. Occasional Extrasystoles are present.    Chest Wall: PMI is not displaced.     Pulses: Normal pulses.     Heart sounds: Normal heart sounds. No murmur (Cannot exclude soft systolic murmur.) heard.   No friction rub. No gallop.  Pulmonary:     Effort: Pulmonary effort is normal. No respiratory distress.     Breath sounds: Normal breath sounds. No wheezing, rhonchi or rales.  Abdominal:     General: Abdomen is flat. Bowel sounds are normal. There is no distension.     Palpations: Abdomen is soft. There is no mass.     Tenderness: There is no abdominal tenderness.     Comments: No HSM or bruit  Musculoskeletal:        General: No swelling. Normal range of motion.     Cervical back: Normal range of motion and neck supple.  Skin:    General: Skin is warm and dry.     Coloration: Skin is not pale.  Neurological:     General: No focal deficit present.     Mental Status: She is alert and oriented to person, place, and time.     Motor: No  weakness.     Gait: Gait normal.  Psychiatric:        Mood and Affect: Mood normal.        Behavior: Behavior normal.        Thought Content: Thought content normal.        Judgment: Judgment normal.     Comments: Somewhat anxious     Adult ECG Report  Rate: 86;  Rhythm: normal sinus rhythm and normal axis, intervals and durations. ;   Narrative Interpretation: Normal/stable EKG.  Recent Labs: 11/07/2020-no new labs Na+ 143, K+ 5.3, Cl- 106, HCO3-24, BUN 15, Cr 0.75, Glu 87, Ca2+ 9.5; AST 16, ALT 17, AlkP 44; TSH 0.014 CBC: W 7.9, H/H 12.0/36.1, Plt 248   No results found for: CHOL, HDL, LDLCALC, LDLDIRECT, TRIG, CHOLHDL Lab Results  Component Value Date   CREATININE 0.68 10/26/2020   BUN 16 10/26/2020   NA 138 10/26/2020   K 3.9 10/26/2020   CL 106 10/26/2020   CO2 25 10/26/2020   CBC Latest Ref Rng & Units 10/26/2020 11/03/2017 11/27/2014  WBC 4.0 - 10.5 K/uL 5.1 7.9 19.0(H)  Hemoglobin 12.0 - 15.0 g/dL 12.6 12.5 9.7(L)  Hematocrit 36.0 - 46.0 % 37.4 37.8 29.1(L)  Platelets 150 - 400 K/uL 151  202 181    No results found for: HGBA1C Lab Results  Component Value Date   TSH 1.380 01/04/2014    ==================================================  COVID-19 Education: The signs and symptoms of COVID-19 were discussed with the patient and how to seek care for testing (follow up with PCP or arrange E-visit).    I spent a total of 26 minutes with the patient spent in direct patient consultation.  Additional time spent with chart review  / charting (studies, outside notes, etc): 15 min Total Time: 41 min  Current medicines are reviewed at length with the patient today.  (+/- concerns) none  This visit occurred during the SARS-CoV-2 public health emergency.  Safety protocols were in place, including screening questions prior to the visit, additional usage of staff PPE, and extensive cleaning of exam room while observing appropriate contact time as indicated for disinfecting  solutions.  Notice: This dictation was prepared with Dragon dictation along with smart phrase technology. Any transcriptional errors that result from this process are unintentional and may not be corrected upon review.   Studies Ordered:  Orders Placed This Encounter  Procedures   EKG 12-Lead     Patient Instructions / Medication Changes & Studies & Tests Ordered   Patient Instructions  Medication Instructions:    Change dosing of  Inderal  ( propranolol) for the first week take 1/4 of 20 mg tablet - at bedtime,if you are able to tolerate increase to  1/4 tablet twice a day .  If able to tolerate you may call the office to have prescription  to be changed to  1/2 tablet of 10 mg of propranolol ( it will be easier to cut in half)    *If you need a refill on your cardiac medications before your next appointment, please call your pharmacy*   Lab Work: Not needed    Testing/Procedures:  Not needed  Follow-Up: At [facility], you and your health needs are our priority.  As part of our continuing mission to provide you with exceptional heart care, we have created designated Provider Care Teams.  These Care Teams include your primary Cardiologist (physician) and Advanced Practice Providers (APPs -  Physician Assistants and Nurse Practitioners) who all work together to provide you with the care you need, when you need it.     Your next appointment:   5 to 6 month(s)  The format for your next appointment:   In Person  Provider:   Glenetta Hew, MD    Other Instructions    Glenetta Hew, M.D., M.S. Interventional Cardiologist   Pager # [phone] Phone # [phone] [address] [place], [place] [address]   Thank you for choosing Heartcare at [facility]!!

## 2021-03-23 NOTE — Patient Instructions (Addendum)
Medication Instructions:    Change dosing of  Inderal  ( propranolol) for the first week take 1/4 of 20 mg tablet - at bedtime,if you are able to tolerate increase to  1/4 tablet twice a day .  If able to tolerate you may call the office to have prescription  to be changed to  1/2 tablet of 10 mg of propranolol ( it will be easier to cut in half)    *If you need a refill on your cardiac medications before your next appointment, please call your pharmacy*   Lab Work: Not needed    Testing/Procedures:  Not needed  Follow-Up: At [facility], you and your health needs are our priority.  As part of our continuing mission to provide you with exceptional heart care, we have created designated Provider Care Teams.  These Care Teams include your primary Cardiologist (physician) and Advanced Practice Providers (APPs -  Physician Assistants and Nurse Practitioners) who all work together to provide you with the care you need, when you need it.     Your next appointment:   5 to 6 month(s)  The format for your next appointment:   In Person  Provider:   Glenetta Hew, MD    Other Instructions

## 2021-04-14 ENCOUNTER — Encounter: Payer: Self-pay | Admitting: Cardiology

## 2021-04-14 NOTE — Assessment & Plan Note (Signed)
Normal echo and CPX.  Likely not anginal in nature.

## 2021-04-14 NOTE — Assessment & Plan Note (Signed)
Echo is normal.  CPX also normal.  Likely simply related to fatigue/deconditioning.

## 2021-04-14 NOTE — Assessment & Plan Note (Signed)
Simply noted sinus tachycardia on monitor.  1 atrial run noted.  Otherwise nothing significant.  She did not tolerate full dose of propranolol.  Like to see if she can potentially get on a little bit.  We will start off with 1/4 tablet of propranolol) which is 5 mg) twice daily.  Continue with hydration, and liberalization of salt.  Mild prolapse noted, but I do not think this has any palpitations.

## 2021-04-14 NOTE — Assessment & Plan Note (Signed)
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  No significant MR.  Reassess in 2 to 3 years depending on symptoms and +/- murmur.

## 2021-04-25 ENCOUNTER — Ambulatory Visit: Payer: No Typology Code available for payment source | Admitting: Neurology

## 2021-06-07 ENCOUNTER — Encounter: Payer: Self-pay | Admitting: Psychiatry

## 2021-06-07 ENCOUNTER — Ambulatory Visit (INDEPENDENT_AMBULATORY_CARE_PROVIDER_SITE_OTHER): Payer: No Typology Code available for payment source | Admitting: Psychiatry

## 2021-06-07 ENCOUNTER — Other Ambulatory Visit: Payer: Self-pay

## 2021-06-07 VITALS — BP 117/72 | HR 82 | Ht 68.0 in | Wt 155.0 lb

## 2021-06-07 DIAGNOSIS — U099 Post covid-19 condition, unspecified: Secondary | ICD-10-CM | POA: Diagnosis not present

## 2021-06-07 DIAGNOSIS — G8929 Other chronic pain: Secondary | ICD-10-CM | POA: Diagnosis not present

## 2021-06-07 DIAGNOSIS — R251 Tremor, unspecified: Secondary | ICD-10-CM | POA: Diagnosis not present

## 2021-06-07 DIAGNOSIS — R519 Headache, unspecified: Secondary | ICD-10-CM | POA: Diagnosis not present

## 2021-06-07 NOTE — Progress Notes (Signed)
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  At her last visit she was started on propranolol for prevention and Relpax for rescue. She was referred to neck PT for occipital neuralgia and cervicalgia.  Interval History: Since her last visit she has improved. Still has a mild constant headache but severity has decreased by 50%. She was not able to tolerate 20 mg BID of propranolol so this was decreased to 5 mg BID. Even this dose makes her drowsy so she takes it intermittently.  She has been taking magnesium daily which has been helping. Has been slowly increasing her dose as it causes GI upset.  Has not taken Relpax because she was afraid of the side effects.  Neck PT has been has been helping a lot with her neck pain.  Tremors have improved. Now she mostly only gets tremors temporarily after a physical therapy session.  Headache days per month: 30 Headache free days per month: 0  Current Headache Regimen: Preventative: none Abortive: Relpax mg PRN    Prior Therapies                                  Robaxin  Celebrex Relpax Propranolol 5 mg BID - drowsiness  Physical Exam:   Vital Signs: BP 117/72    Pulse 82    Ht 5\' 8"  (1.727 m)    Wt 155 lb (70.3 kg)    BMI 23.57 kg/m  GENERAL:  well appearing, in no acute distress, alert  SKIN:  Color, texture, turgor normal. No rashes or lesions HEAD:  Normocephalic/atraumatic. RESP: normal respiratory effort MSK:  No gross joint deformities.   NEUROLOGICAL: Mental Status: Alert, oriented to person, place and time, Follows commands, and Speech fluent and appropriate. Cranial Nerves: PERRL, face symmetric, no dysarthria, hearing grossly intact Motor: moves all extremities equally Gait: normal-based.  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 She was unable to tolerate propranolol due to drowsiness, even at low doses. She would prefer to avoid medication if possible. Recommend she add CoQ10 and B2 to magnesium for headache prevention. Will also plan to have her return for occipital nerve block and see if this can provide some relief. Has not taken Relpax due to fear of side effects. Encouraged her to try half a tablet for her next migraine.  PLAN: -Rescue: Decrease Relpax to 20 mg PRN -Prevention: Start CoQ10 and B2 -Return for occipital nerve block -Next steps: consider low dose topamax or gabapentin   Follow-up: for occipital nerve block  I spent a total of 30 minutes on the date of the service. Headache education was done. Discussed lifestyle modification including increased oral hydration, decreased caffeine, exercise and stress management. Discussed treatment options including preventive and acute medications, and natural supplements. Discussed medication side effects, adverse reactions and drug interactions. Written educational materials and patient instructions outlining all of the above were given.  Genia Harold, MD 06/07/21 11:54 AM

## 2021-06-07 NOTE — Patient Instructions (Addendum)
Try taking a half pill of Relpax as needed for severe headache Stop propranolol Consider occipital nerve block  Natural supplements: Magnesium Oxide or Magnesium Glycinate 500 mg at bed (up to 800 mg daily) Coenzyme Q10 300 mg in AM Vitamin B2- 200 mg twice a day  Add 1 supplement at a time since even natural supplements can have undesirable side effects. You can sometimes buy supplements cheaper (especially Coenzyme Q10) at www.[email or web address]://com[employer]-perez.com/ or at [employer].  Vitamins and herbs that show potential:  Magnesium: Magnesium (250 mg twice a day or 500 mg at bed) has a relaxant effect on smooth muscles such as blood vessels. Individuals suffering from frequent or daily headache usually have low magnesium levels which can be increase with daily supplementation of 400-750 mg. Three trials found 40-90% average headache reduction  when used as a preventative. Magnesium also demonstrated the benefit in menstrually related migraine.  Magnesium is part of the messenger system in the serotonin cascade and it is a good muscle relaxant.  It is also useful for constipation which can be a side effect of other medications used to treat migraine. Good sources include nuts, whole grains, and tomatoes. Side Effects: loose stool/diarrhea  Riboflavin (vitamin B 2) 200 mg twice a day. This vitamin assists nerve cells in the production of ATP a principal energy storing molecule.  It is necessary for many chemical reactions in the body.  There have been at least 3 clinical trials of riboflavin using 400 mg per day all of which suggested that migraine frequency can be decreased.  All 3 trials showed significant improvement in over half of migraine sufferers.  The supplement is found in bread, cereal, milk, meat, and poultry.  Most Americans get more riboflavin than the recommended daily allowance, however riboflavin deficiency is not necessary for the supplements to help prevent headache. Side effects: energizing, green  urine  Coenzyme Q10: This is present in almost all cells in the body and is critical component for the conversion of energy.  Recent studies have shown that a nutritional supplement of CoQ10 can reduce the frequency of migraine attacks by improving the energy production of cells as with riboflavin.  Doses of 150 mg twice a day have been shown to be effective.

## 2021-06-13 ENCOUNTER — Ambulatory Visit (INDEPENDENT_AMBULATORY_CARE_PROVIDER_SITE_OTHER): Payer: No Typology Code available for payment source | Admitting: Psychiatry

## 2021-06-13 ENCOUNTER — Other Ambulatory Visit: Payer: Self-pay

## 2021-06-13 VITALS — BP 111/68 | HR 86

## 2021-06-13 DIAGNOSIS — M5481 Occipital neuralgia: Secondary | ICD-10-CM | POA: Diagnosis not present

## 2021-06-13 DIAGNOSIS — M542 Cervicalgia: Secondary | ICD-10-CM | POA: Diagnosis not present

## 2021-06-13 NOTE — Progress Notes (Signed)
Procedure: Occipital Nerve injection   Location: bilateral occiput  The risks, benefits and anticipated outcomes of the procedure, the risks and benefits of the alternatives to the procedure, and the roles and tasks of the personnel to be involved, were discussed with the patient, and the patient consents to the procedure and agrees to proceed.    A combination of 1 cc betamethasone 6 mg and 4 cc of 0.24% bupivacaine were prepared in 2 syringes (5 cc).  One trigger point was identified in the splenius capitis muscle on either side. The needle was placed perpendicular and the needle advanced 1.5 cm. After aspiration to ensure no obstruction or presence of blood, the area was injected.   Pressure was held and no hematoma was noted. The patient noted relief of pain after 5 minutes.  The patient was told to use heat if there was discomfort later in the day.  Pre procedure Pain: 6/10 Post procedure pain: 4/10  Genia Harold, MD 06/13/21 2:59 PM

## 2021-07-22 ENCOUNTER — Other Ambulatory Visit: Payer: Self-pay | Admitting: Gastroenterology

## 2021-08-18 IMAGING — US US EXTREM LOW VENOUS*L*
1 series · 14 of 24 positions shown · non-contrast
Comparison: None.

CLINICAL DATA: Left leg pain.

EXAM:
LEFT LOWER EXTREMITY VENOUS DOPPLER ULTRASOUND
TECHNIQUE: Gray-scale sonography with compression, as well as color and duplex
ultrasound, were performed to evaluate the deep venous system(s)
from the level of the common femoral vein through the popliteal and
proximal calf veins.

[Series 1: us extrem low venous*left* · 14 of 38 slices shown]
[im 1/38]
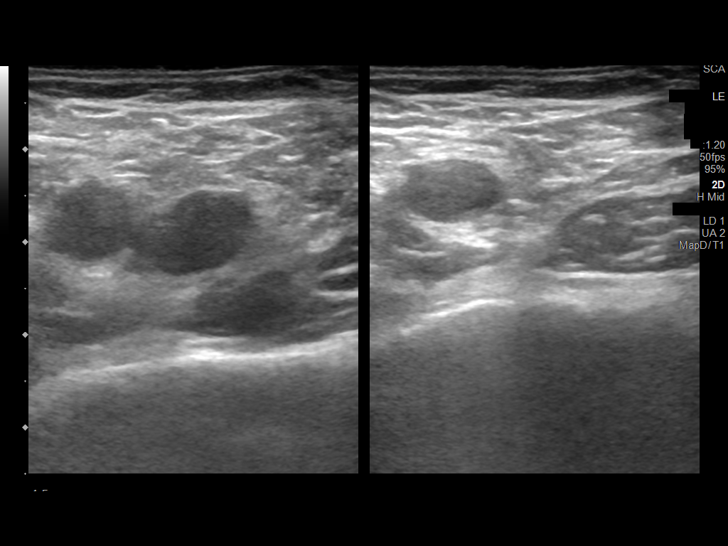
[im 4/38]
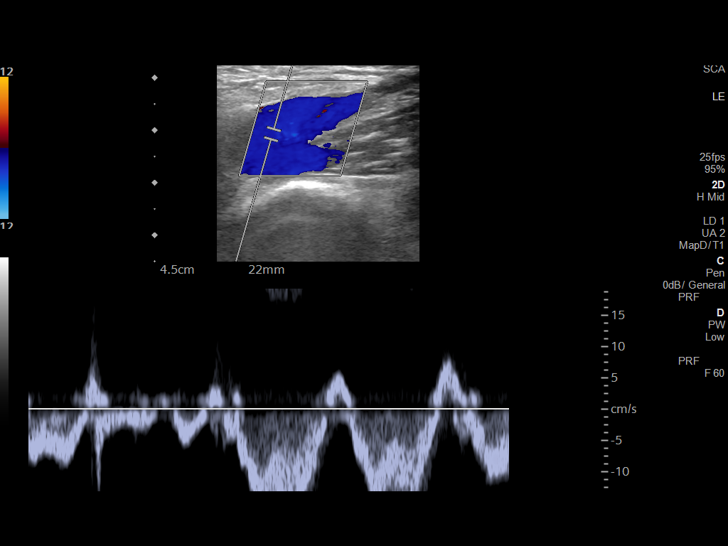
[im 7/38]
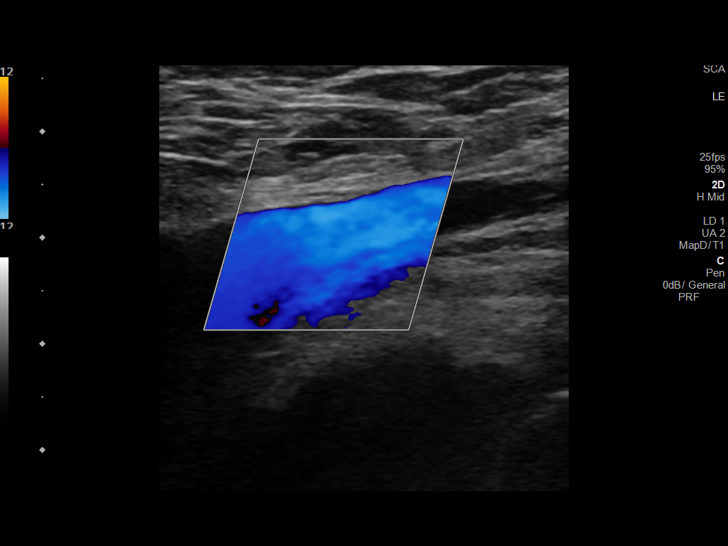
[im 10/38]
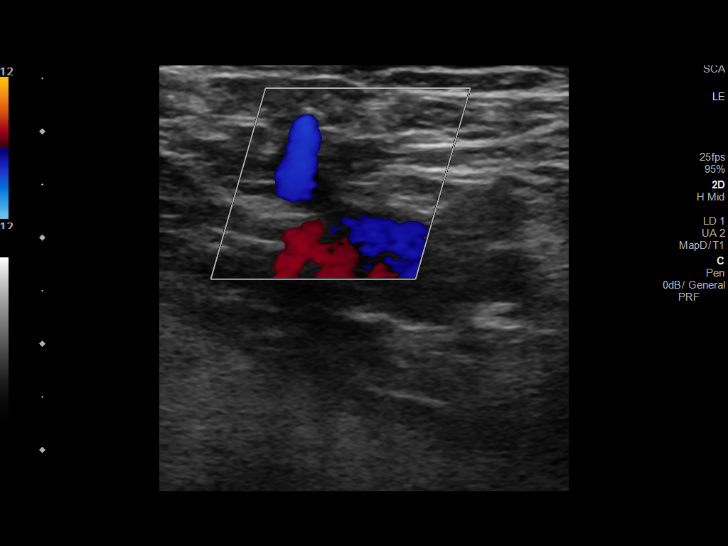
[im 12/38]
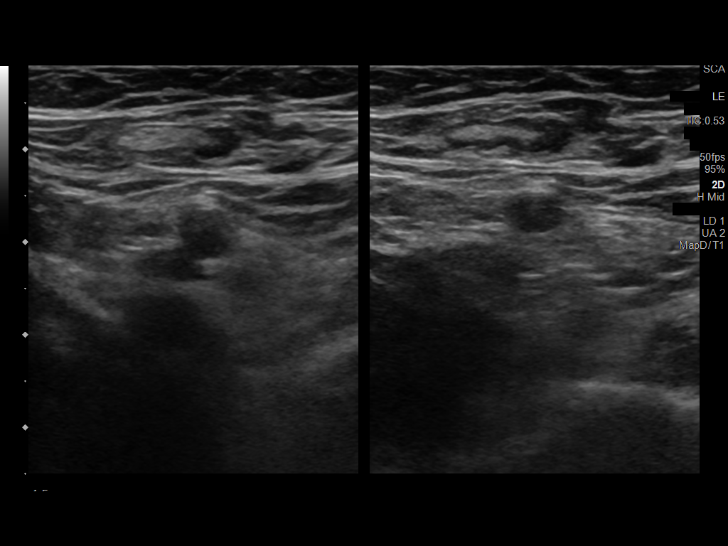
[im 15/38]
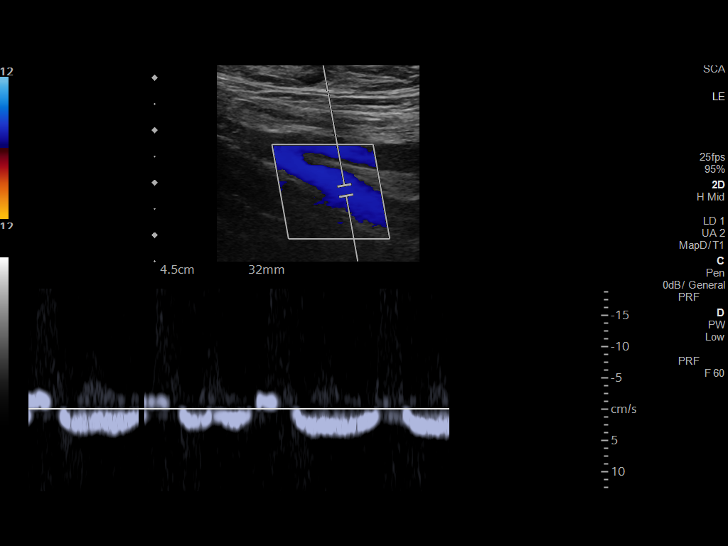
[im 18/38]
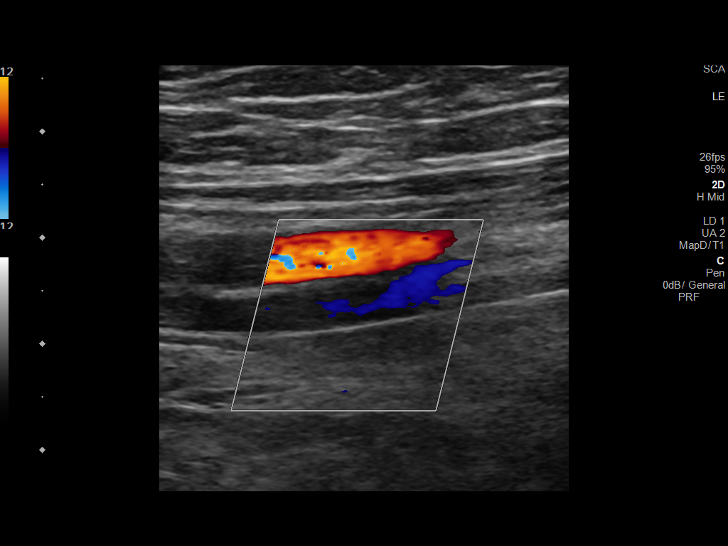
[im 20/38]
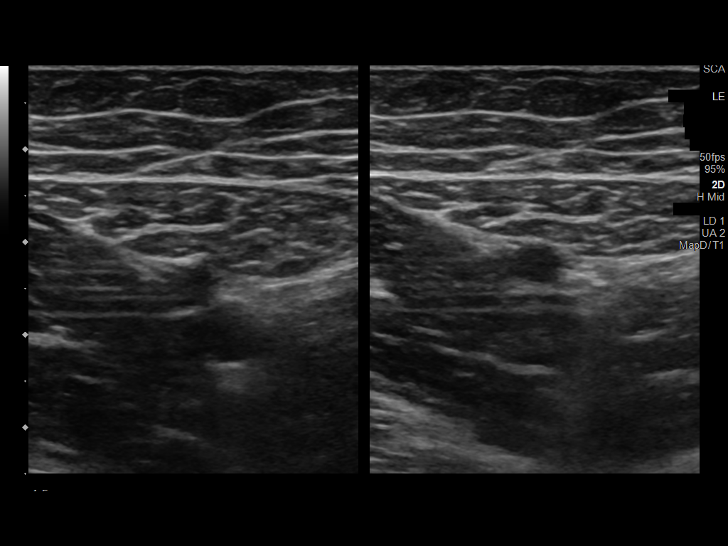
[im 23/38]
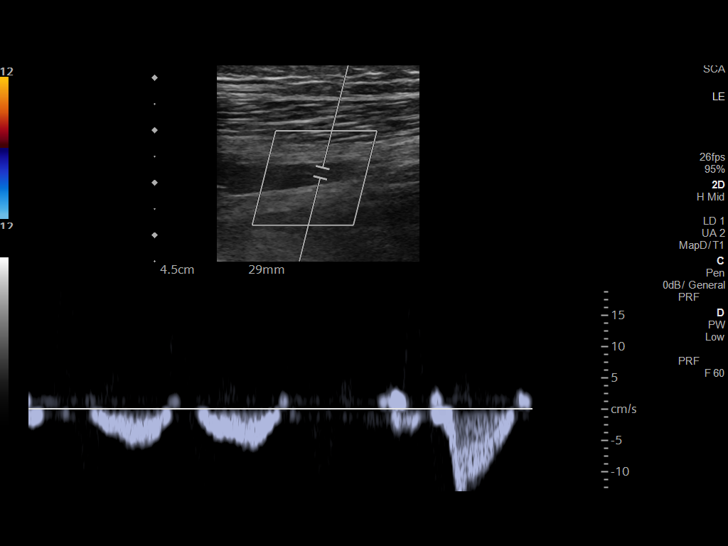
[im 26/38]
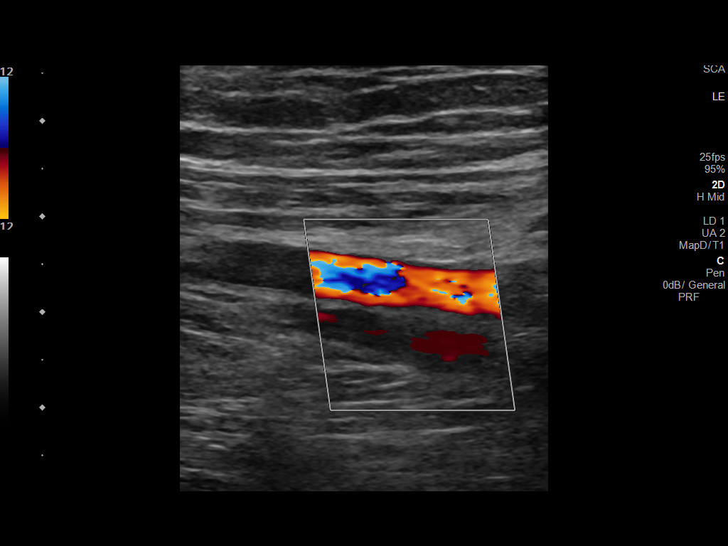
[im 29/38]
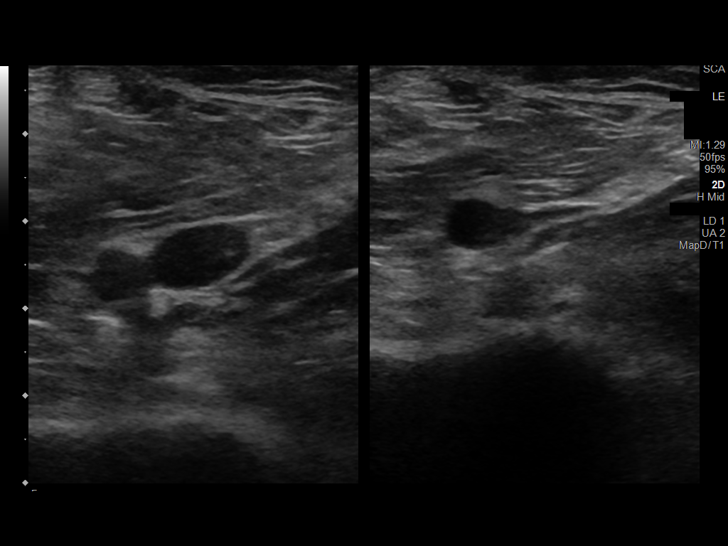
[im 31/38]
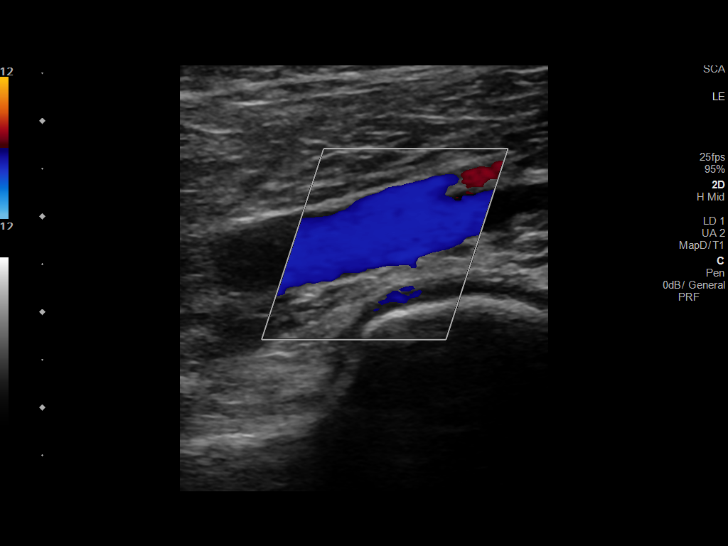
[im 34/38]
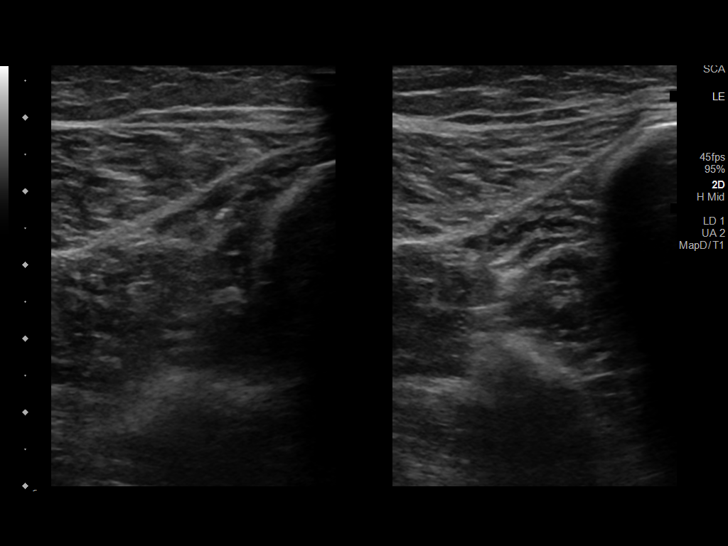
[im 38/38]
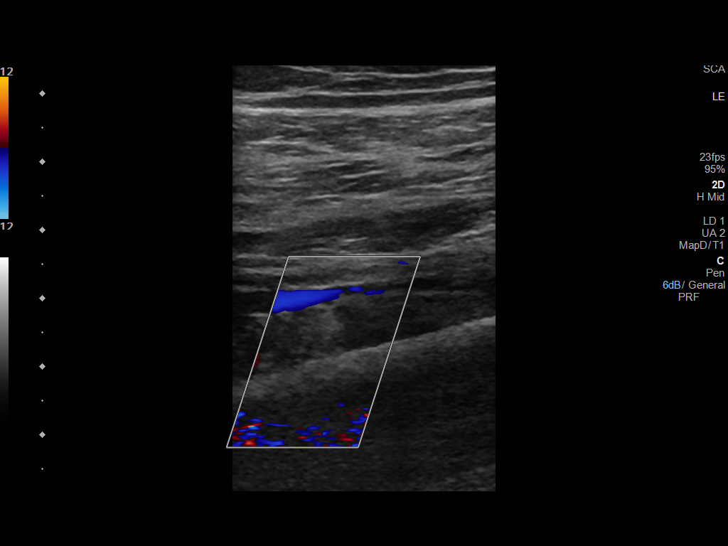

[14 of 24 positions shown; findings below may reference images not displayed]

FINDINGS: VENOUS

Normal compressibility of the common femoral, superficial femoral,
and popliteal veins, as well as the visualized calf veins.
Visualized portions of profunda femoral vein and great saphenous
vein unremarkable. No filling defects to suggest DVT on grayscale or
color Doppler imaging. Doppler waveforms show normal direction of
venous flow, normal respiratory plasticity and response to
augmentation.

Limited views of the contralateral common femoral vein are
unremarkable.

OTHER

None.

Limitations: None.
IMPRESSION: Negative exam.

## 2021-08-21 ENCOUNTER — Ambulatory Visit: Payer: No Typology Code available for payment source | Admitting: Psychiatry

## 2021-08-21 ENCOUNTER — Telehealth: Payer: Self-pay | Admitting: Psychiatry

## 2021-08-21 NOTE — Telephone Encounter (Signed)
FYI for Dr Billey Gosling and POD 3 ?Pt was initially scheduled for a requested Nerve Block for 11:00 on today 05-01.  Phone rep then found out that Dr Billey Gosling was out sick today so pt was then offered a New Pt slot for 05-26, pt accepted with 8:15 check in and is on wait list.  No call back requested ?

## 2021-08-21 NOTE — Telephone Encounter (Signed)
Noted  

## 2021-08-24 ENCOUNTER — Encounter: Payer: Self-pay | Admitting: Psychiatry

## 2021-08-24 ENCOUNTER — Ambulatory Visit (INDEPENDENT_AMBULATORY_CARE_PROVIDER_SITE_OTHER): Payer: No Typology Code available for payment source | Admitting: Psychiatry

## 2021-08-24 VITALS — BP 124/73 | HR 87

## 2021-08-24 DIAGNOSIS — M542 Cervicalgia: Secondary | ICD-10-CM | POA: Diagnosis not present

## 2021-08-24 DIAGNOSIS — M5481 Occipital neuralgia: Secondary | ICD-10-CM | POA: Diagnosis not present

## 2021-08-24 MED ORDER — UBRELVY 100 MG PO TABS: 100.0000 mg | ORAL_TABLET | ORAL | 0 refills | Status: DC | PRN

## 2021-08-24 MED ORDER — TOPIRAMATE 25 MG PO TABS: ORAL_TABLET | ORAL | 3 refills | Status: DC

## 2021-08-24 NOTE — Patient Instructions (Addendum)
Start Topamax for headache prevention. Take 25 mg (1 pill) at bedtime for one week, then increase to 50 mg (2 pills) at bedtime for one week, then take 75 mg (3 pills) at bedtime for one week, then take 100 mg (4 pills) at bedtime ? ?Try Ubrelvy 100 mg as needed for migraine. May repeat a dose in 2 hours if headache persists. ?

## 2021-08-24 NOTE — Progress Notes (Signed)
Procedure: Occipital Nerve injection/Trigger point injection ? ?Location: bilateral occiput ? ?The risks, benefits and anticipated outcomes of the procedure, the risks and benefits of the alternatives to the procedure, and the roles and tasks of the personnel to be involved, were discussed with the patient, and the patient consents to the procedure and agrees to proceed.   ?  ?A combination of 1 cc betamethasone 6 mg and 4 cc of 0.25% bupivacaine were prepared in 2 syringes (5 cc).  2 trigger points on the splenius capitus were identified and injected. The left and right greater occipital nerves were injected 3cm caudal and 1.5 cm lateral to the inion where the main trunk of the occipital nerve penetrates the semispinalis muscle.  The needle was placed perpendicular and the needle advanced 1.5 cm. After aspiration to ensure no obstruction or presence of blood, the area was injected.  The needle was repositioned in a fan-like manner and the entire area was injected. Pressure was held and no hematoma was noted.  ? ?Plan: ?-Start Topamax 25 mg QHS, increase by 25 mg weekly up to 100 mg QHS ?-Ubrelvy sample provided today ? ?Genia Harold, MD ?08/24/21 ?2:00 PM ? ?

## 2021-08-28 ENCOUNTER — Encounter: Payer: Self-pay | Admitting: Cardiology

## 2021-08-28 ENCOUNTER — Ambulatory Visit (INDEPENDENT_AMBULATORY_CARE_PROVIDER_SITE_OTHER): Payer: No Typology Code available for payment source | Admitting: Cardiology

## 2021-08-28 VITALS — BP 110/80 | HR 83 | Ht 69.0 in | Wt 158.6 lb

## 2021-08-28 DIAGNOSIS — I341 Nonrheumatic mitral (valve) prolapse: Secondary | ICD-10-CM | POA: Diagnosis not present

## 2021-08-28 DIAGNOSIS — I7300 Raynaud's syndrome without gangrene: Secondary | ICD-10-CM

## 2021-08-28 DIAGNOSIS — R002 Palpitations: Secondary | ICD-10-CM | POA: Diagnosis not present

## 2021-08-28 DIAGNOSIS — R0789 Other chest pain: Secondary | ICD-10-CM | POA: Diagnosis not present

## 2021-08-28 MED ORDER — DILTIAZEM HCL ER COATED BEADS 120 MG PO CP24: 120.0000 mg | ORAL_CAPSULE | Freq: Every day | ORAL | 3 refills | Status: DC

## 2021-08-28 MED ORDER — DILTIAZEM HCL 30 MG PO TABS: 30.0000 mg | ORAL_TABLET | Freq: Two times a day (BID) | ORAL | 6 refills | Status: DC | PRN

## 2021-08-28 NOTE — Patient Instructions (Addendum)
Medication Instructions:  ? DILTIAZEM XT  120 MG AT BEDTIME ? ? IF  NEED FOR EXCESSIVE PALPATION   YOU MAY TAKE 30 MG  ( SHORT ACTING)  EVERY 12 HOURS AS NEEDED THEN STOP . ?  ?*If you need a refill on your cardiac medications before your next appointment, please call your pharmacy* ? ? ?Lab Work: ?NOT NEEDED ? ?If you have labs (blood work) drawn today and your tests are completely normal, you will receive your results only by: ?MyChart Message (if you have MyChart) OR ?A paper copy in the mail ?If you have any lab test that is abnormal or we need to change your treatment, we will call you to review the results. ? ? ?Testing/Procedures: ? ?NOT NEEDED ? ?Follow-Up: ?At [facility], you and your health needs are our priority.  As part of our continuing mission to provide you with exceptional heart care, we have created designated Provider Care Teams.  These Care Teams include your primary Cardiologist (physician) and Advanced Practice Providers (APPs -  Physician Assistants and Nurse Practitioners) who all work together to provide you with the care you need, when you need it. ? ?  ? ?Your next appointment:   ?4 month(s) ? ?The format for your next appointment:   ?In Person ? ?Provider:   ?Glenetta Hew, MD  ? ? ?Other Instructions  ? ? INFORMATION ABOUT [facility] ?

## 2021-08-28 NOTE — Progress Notes (Signed)
Primary Care Provider: [name], MD Cardiologist: Glenetta Hew, MD Electrophysiologist: None  Clinic Note: Chief Complaint  Patient presents with   Follow-up    Improving.  Symptoms long COVID   Palpitations    Could not tolerate even low-dose of Inderal.  Extreme fatigue.  Still has heart racing spells.    ===================================  ASSESSMENT/PLAN   Problem List Items Addressed This Visit       Cardiology Problems   Mitral valve anterior leaflet prolapse (Chronic)    Mild MR with MVP noted on echo in October 2022.  I do not think this necessarily has much to do with her sensation of palpitations.  However we will continue to monitor with echo in 2 to 3 years depending on symptoms and murmur       Relevant Medications   diltiazem (CARDIZEM CD) 120 MG 24 hr capsule   diltiazem (CARDIZEM) 30 MG tablet     Other   Chest pressure    Symptoms of been evaluated with a CPX and echocardiogram both are normal.  Most likely nonischemic/noncardiac symptoms.       Rapid palpitations - Primary (Chronic)    Unfortunately, she did not tolerate Inderal.  I am fearful that she is just [place] fatigue with any beta-blocker which may be part of the long COVID issue.  We will shift gears and use calcium channel blocker, especially with the thought that may be able to help her symptoms that seem consistent with Raynaud's  Plan: Start diltiazem XT 120 mg nightly with as needed 30 mg diltiazem short acting for breakthrough       Raynaud phenomenon (Chronic)    She describes having her hands mostly but also fingers and toes doing very painful and tense when it is cold.  Sounds like Raynaud's.  With the symptoms along with her palpitations, I have chosen to try calcium channel blockers as an option for treating tachycardia and Raynaud's. . Plan: Start diltiazem XT 120 mg daily with as needed short acting 30 mg diltiazem for breakthrough spells          ===================================  HPI:    Cheryl Lara is a 41 y.o. female with a PMH notable for ADD, and long-haul COVID seen for complaints of palpitations who presents today for 85-monthfollow-up at the request of LCher Nakai MD.  Cheryl MESSMERwas last seen on April 03, 2021: She was feeling a little better.  Still getting tired quicker than usual.  But less tired if she gets to sleep.  Some exertional dyspnea and fatigue with exertion.  Stop taking propranolol because of headaches also noted right-sided chest discomfort almost every day.  Still noting palpitations despite drinking 70 to 80 ounces of water a day. => We decided to try to cut down propranolol dosing to 5 mg twice daily  Cardiovascular ROS: positive for - chest pain, dyspnea on exertion, palpitations, rapid heart rate, and lightheadedness/dizziness but no syncope or near syncope.  Improved exercise intolerance, fatigue; the chest discomfort and dyspnea seems to be better if not stable.  Also has headaches. negative for - edema, orthopnea, paroxysmal nocturnal dyspnea, or TIA or amaurosis fugax, claudication   Recent Hospitalizations: None  Reviewed  CV studies:    The following studies were reviewed today: (if available, images/films reviewed: From Epic Chart or Care Everywhere) No new studies:  Interval History:   Cheryl Lara today for follow-up indicating that nonetheless, some Anturol just cause extreme fatigue.  She therefore stopped taking beta-blocker altogether.  She still has episodes of heart rate increase with stress or panic.  She also occasionally has chest pain at various times during the day.  However not as frequent.  Able to do more PT -> up to a couple days a week but still pretty weak. Still working on drinking plenty of fluids.  No syncope or near syncope.  No resting exertional dyspnea.  No PND orthopnea or edema.  She does note that her hands and fingers get very tight and painful  especially in the cold.  They turn white and ache.   REVIEWED OF SYSTEMS   Review of Systems  Constitutional:  Positive for malaise/fatigue (In general, she is better than she had been.  Doing more PT.  But just could not tolerate even low-dose of Inderal.) and weight loss.  HENT:  Negative for congestion and nosebleeds.   Respiratory:  Positive for shortness of breath (improving). Negative for cough.   Cardiovascular:        Per HPI  Gastrointestinal:  Positive for constipation and heartburn. Negative for blood in stool and melena.  Genitourinary:  Negative for dysuria and hematuria.  Musculoskeletal:  Positive for myalgias. Negative for falls and joint pain.       Hands get cold & painful  Neurological:  Negative for dizziness (improving) and weakness.  Psychiatric/Behavioral:  The patient is nervous/anxious.    I have reviewed and (if needed) personally updated the patient's problem list, medications, allergies, past medical and surgical history, social and family history.   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   Dichorionic diamniotic twin pregnancy in third trimester 11/20/2014   GERD (gastroesophageal reflux disease)    HPV (human papilloma virus) infection    Iron deficiency anemia 11/27/2014   Postpartum care following cesarean delivery of Di-Di twins (8/6) 11/27/2014   Preterm uterine contractions in third trimester, antepartum 11/20/2014   Scarlet fever    hx of    PAST SURGICAL HISTORY   Past Surgical History:  Procedure Laterality Date   CARDIOPULMONARY EXERCISE TEST (CPX)  02/13/2021   Normal functional capacity.  No cardiopulmonary abnormalities.  No evidence of any exercise-induced  bronchospasm   CESAREAN SECTION N/A 11/27/2014   Procedure: CESAREAN SECTION;  Surgeon: Brien Few, MD;  Location: [facility];  Service: Obstetrics;  Laterality: N/A;   COLONOSCOPY     CRYOABLATION     LAPAROSCOPIC NISSEN FUNDOPLICATION  8546   polyp     removal 2009   TRANSTHORACIC ECHOCARDIOGRAM  02/10/2021   EF 60 to 65%.  No R WMA.  GR 1 DD?Marland Kitchen  Normal RV size and function.  Normal aortic valve.  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   TYMPANOSTOMY TUBE PLACEMENT     UPPER GASTROINTESTINAL ENDOSCOPY     WISDOM TOOTH EXTRACTION     Cardiac event monitor: Patch Wear Time:  3 days and 23 hours (2022-10-14T19:21:12-398 to 2022-10-18T18:35:54-0400)  Patient had a min HR of 56 bpm, max HR of 152 bpm, and avg HR of 93 bpm. Predominant underlying rhythm was Sinus Rhythm. Slight P wave morphology changes were noted. 1 run of Supraventricular Tachycardia  occurred lasting 4 beats with a max rate of 128 bpm (avg 122 bpm). Isolated SVEs were rare (<1.0%), SVE Couplets were rare (<1.0%), and no SVE Triplets were present. Isolated VEs were rare (<1.0%), and no VE Couplets or VE Triplets were present.  Immunization History  Administered Date(s) Administered   Influenza,inj,Quad PF,6+ Mos 01/05/2014   Pneumococcal Polysaccharide-23 10/04/2012    MEDICATIONS/ALLERGIES   Current Meds  Medication Sig   albuterol (VENTOLIN HFA) 108 (90 Base) MCG/ACT inhaler Inhale into the lungs as needed.   eletriptan (RELPAX) 40 MG tablet Take 1 tablet (40 mg total) by mouth as needed for migraine or headache. May repeat in 2 hours if headache persists or recurs.   methocarbamol (ROBAXIN) 500 MG tablet Take 1,000 mg by mouth every 8 (eight) hours as needed.   metroNIDAZOLE (METROCREAM) 0.75 % cream Apply topically.   mometasone (ELOCON) 0.1 % cream Apply topically 2 (two) times daily.   MYDAYIS 50 MG CP24 Take 1 capsule by mouth every morning.   pantoprazole (PROTONIX) 40 MG tablet TAKE 1  TABLET BY MOUTH TWICE A DAY   pimecrolimus (ELIDEL) 1 % cream Apply 1 application. topically 2 (two) times daily as needed.   topiramate (TOPAMAX) 25 MG tablet Start Topamax for headache prevention. Take 25 mg (1 pill) at bedtime for one week, then increase to 50 mg (2 pills) at bedtime for one week, then take 75 mg (3 pills) at bedtime for one week, then take 100 mg (4 pills) at bedtime   traZODone (DESYREL) 50 MG tablet Take 50-100 mg by mouth at bedtime as needed.   Ubrogepant (UBRELVY) 100 MG TABS Take 100 mg by mouth as needed.    Allergies  Allergen Reactions   Codeine Nausea And Vomiting   Cottonseed Oil Other (See Comments)   Gluten Meal Other (See Comments)    Unknown   Other Hives    Cotton seed oil and gluten   Nickel Dermatitis, Itching and Rash    SOCIAL HISTORY/FAMILY HISTORY   Reviewed in Epic:  Pertinent findings:  Social History   Tobacco Use   Smoking status: Never   Smokeless tobacco: Never  Vaping Use   Vaping Use: Never used  Substance Use Topics   Alcohol use: Yes    Comment: occ   Drug use: No   Social History   Social History Narrative   She is married, mother of 3 children ages 72, 33 and 55.    OBJCTIVE -PE, EKG, labs   Wt Readings from Last 3 Encounters:  08/31/21 152 lb (68.9 kg)  08/28/21 158 lb 9.6 oz (71.9 kg)  06/07/21 155 lb (70.3 kg)    Physical Exam: BP 110/80 (BP Location: Left Arm)   Pulse 83   Ht '5\' 9"'$  (1.753 m)   Wt 158 lb 9.6 oz (71.9 kg)   SpO2 100%   BMI 23.42 kg/m  Physical Exam Vitals reviewed.  Constitutional:      General: She is not in acute distress.    Appearance: Normal appearance. She is normal weight. She is not ill-appearing or toxic-appearing.  HENT:     Head: Normocephalic and atraumatic.  Neck:     Vascular: No carotid bruit or JVD.  Cardiovascular:     Rate and Rhythm: Normal rate and regular rhythm. Occasional Extrasystoles are present.    Chest Wall: PMI is not displaced.     Pulses: Normal  pulses.     Heart sounds: Normal heart sounds, S1 normal and  S2 normal. No murmur heard.   No friction rub. No gallop.  Pulmonary:     Effort: Pulmonary effort is normal. No respiratory distress.     Breath sounds: Normal breath sounds. No wheezing, rhonchi or rales.  Chest:     Chest wall: Tenderness present.  Musculoskeletal:        General: No swelling. Normal range of motion.     Cervical back: Normal range of motion and neck supple.  Skin:    General: Skin is warm and dry.  Neurological:     General: No focal deficit present.     Mental Status: She is alert and oriented to person, place, and time.     Gait: Gait normal.  Psychiatric:        Mood and Affect: Mood normal.        Behavior: Behavior normal.        Thought Content: Thought content normal.        Judgment: Judgment normal.     Adult ECG Report N/a  Recent Labs:  reviewed  No results found for: CHOL, HDL, LDLCALC, LDLDIRECT, TRIG, CHOLHDL Lab Results  Component Value Date   CREATININE 0.68 10/26/2020   BUN 16 10/26/2020   NA 138 10/26/2020   K 3.9 10/26/2020   CL 106 10/26/2020   CO2 25 10/26/2020      Latest Ref Rng & Units 10/26/2020   12:47 PM 11/03/2017    2:05 PM 11/27/2014    8:00 AM  CBC  WBC 4.0 - 10.5 K/uL 5.1   7.9   19.0    Hemoglobin 12.0 - 15.0 g/dL 12.6   12.5   9.7    Hematocrit 36.0 - 46.0 % 37.4   37.8   29.1    Platelets 150 - 400 K/uL 151   202   181      No results found for: HGBA1C Lab Results  Component Value Date   TSH 1.380 01/04/2014    ==================================================  COVID-19 Education: The signs and symptoms of COVID-19 were discussed with the patient and how to seek care for testing (follow up with PCP or arrange E-visit).    I spent a total of 23 min with the patient spent in direct patient consultation.  Additional time spent with chart review  / charting (studies, outside notes, etc): 15 min Total Time: 38 min  Current medicines are reviewed  at length with the patient today.  (+/- concerns) n/a  This visit occurred during the SARS-CoV-2 public health emergency.  Safety protocols were in place, including screening questions prior to the visit, additional usage of staff PPE, and extensive cleaning of exam room while observing appropriate contact time as indicated for disinfecting solutions.  Notice: This dictation was prepared with Dragon dictation along with smart phrase technology. Any transcriptional errors that result from this process are unintentional and may not be corrected upon review.  Studies Ordered:   No orders of the defined types were placed in this encounter.  Meds ordered this encounter  Medications   diltiazem (CARDIZEM CD) 120 MG 24 hr capsule    Sig: Take 1 capsule (120 mg total) by mouth at bedtime.    Dispense:  90 capsule    Refill:  3   diltiazem (CARDIZEM) 30 MG tablet    Sig: Take 1 tablet (30 mg total) by mouth every 12 (twelve) hours as needed. FOR PALPATIONS    Dispense:  30 tablet    Refill:  6  Patient Instructions / Medication Changes & Studies & Tests Ordered   Patient Instructions  Medication Instructions:   DILTIAZEM XT  120 MG AT BEDTIME   IF  NEED FOR EXCESSIVE PALPATION   YOU MAY TAKE 30 MG  ( SHORT ACTING)  EVERY 12 HOURS AS NEEDED THEN STOP .   *If you need a refill on your cardiac medications before your next appointment, please call your pharmacy*   Lab Work: NOT NEEDED  If you have labs (blood work) drawn today and your tests are completely normal, you will receive your results only by: [place] (if you have MyChart) OR A paper copy in the mail If you have any lab test that is abnormal or we need to change your treatment, we will call you to review the results.   Testing/Procedures:  NOT NEEDED  Follow-Up: At [facility], you and your health needs are our priority.  As part of our continuing mission to provide you with exceptional heart care, we have  created designated Provider Care Teams.  These Care Teams include your primary Cardiologist (physician) and Advanced Practice Providers (APPs -  Physician Assistants and Nurse Practitioners) who all work together to provide you with the care you need, when you need it.     Your next appointment:   4 month(s)  The format for your next appointment:   In Person  Provider:   Glenetta Hew, MD    Other Instructions    INFORMATION ABOUT Raynaud'S     Glenetta Hew, M.D., M.S. Interventional Cardiologist   Pager # [phone] Phone # [phone] [address]. [place], [place] [address]   Thank you for choosing [facility] at [facility]!!

## 2021-08-29 ENCOUNTER — Ambulatory Visit: Payer: No Typology Code available for payment source | Admitting: Psychiatry

## 2021-08-31 ENCOUNTER — Other Ambulatory Visit: Payer: Self-pay

## 2021-08-31 ENCOUNTER — Emergency Department (HOSPITAL_COMMUNITY): Payer: No Typology Code available for payment source

## 2021-08-31 ENCOUNTER — Encounter (HOSPITAL_COMMUNITY): Payer: Self-pay

## 2021-08-31 ENCOUNTER — Emergency Department (HOSPITAL_COMMUNITY)
Admission: EM | Admit: 2021-08-31 | Discharge: 2021-08-31 | Disposition: A | Discharge status: Home / Self Care | Payer: No Typology Code available for payment source | Attending: Emergency Medicine | Admitting: Emergency Medicine

## 2021-08-31 ENCOUNTER — Telehealth: Payer: Self-pay | Admitting: Cardiology

## 2021-08-31 DIAGNOSIS — R202 Paresthesia of skin: Secondary | ICD-10-CM | POA: Diagnosis not present

## 2021-08-31 DIAGNOSIS — Z8616 Personal history of COVID-19: Secondary | ICD-10-CM | POA: Insufficient documentation

## 2021-08-31 DIAGNOSIS — R079 Chest pain, unspecified: Secondary | ICD-10-CM

## 2021-08-31 DIAGNOSIS — R0789 Other chest pain: Secondary | ICD-10-CM | POA: Diagnosis present

## 2021-08-31 DIAGNOSIS — R000 Tachycardia, unspecified: Secondary | ICD-10-CM | POA: Diagnosis not present

## 2021-08-31 LAB — BASIC METABOLIC PANEL
Anion gap: 6 (ref 5–15)
BUN: 12 mg/dL (ref 6–20)
CO2: 26 mmol/L (ref 22–32)
Calcium: 9.3 mg/dL (ref 8.9–10.3)
Chloride: 109 mmol/L (ref 98–111)
Creatinine, Ser: 0.79 mg/dL (ref 0.44–1.00)
GFR, Estimated: >60 mL/min (ref >60)
Glucose, Bld: 98 mg/dL (ref 70–99)
Potassium: 4.3 mmol/L (ref 3.5–5.1)
Sodium: 141 mmol/L (ref 135–145)

## 2021-08-31 LAB — CBC
HCT: 39.6 % (ref 36.0–46.0)
Hemoglobin: 12.8 g/dL (ref 12.0–15.0)
MCH: 32 pg (ref 26.0–34.0)
MCHC: 32.3 g/dL (ref 30.0–36.0)
MCV: 99 fL (ref 80.0–100.0)
Platelets: 252 10*3/uL (ref 150–400)
RBC: 4 MIL/uL (ref 3.87–5.11)
RDW: 11.9 % (ref 11.5–15.5)
WBC: 11.8 10*3/uL — ABNORMAL HIGH (ref 4.0–10.5)
nRBC: 0 % (ref 0.0–0.2)

## 2021-08-31 LAB — I-STAT BETA HCG BLOOD, ED (MC, WL, AP ONLY): I-stat hCG, quantitative: <5 m[IU]/mL (ref <5)

## 2021-08-31 LAB — TROPONIN I (HIGH SENSITIVITY)
Troponin I (High Sensitivity): <2 ng/L (ref <18)
Troponin I (High Sensitivity): <2 ng/L (ref <18)

## 2021-08-31 LAB — D-DIMER, QUANTITATIVE: D-Dimer, Quant: <0.27 ug/mL-FEU (ref 0.00–0.50)

## 2021-08-31 NOTE — ED Triage Notes (Signed)
Pt BIB [facility] from physical therapy after having covid. Pt was on the exercise bike when she felt her chest get tight and her heart racing. Pt was initially uncooperative and given 2.5 of versed.   ? ? ?

## 2021-08-31 NOTE — Telephone Encounter (Signed)
Patient is currently admitted and is wanting to know if Dr. [name] can place the orders for the test he would want performed and she have them done at our office so she can go home. She states she's been waiting for 4 hours and has already gave a urine sample as well as blood work. Please advise.  ?

## 2021-08-31 NOTE — ED Provider Notes (Signed)
Blood pressure 120/61, pulse 90, temperature 97.9 ?F (36.6 ?C), temperature source Oral, resp. rate (!) 21, height '5\' 9"'$  (1.753 m), weight 68.9 kg, SpO2 97 %, unknown if currently breastfeeding. ? ?Assuming care from Dr. Roslynn Amble.  In short, Cheryl Lara is a 41 y.o. female with a chief complaint of Chest Pain and Tachycardia ?Marland Kitchen  Refer to the original H&P for additional details. ? ?The current plan of care is to follow up with repeat troponin. ? ?04:27 PM  ?Second troponin less than 2.  Patient advised to follow closely with her cardiologist moving forward.  Discussed tricked ED return precautions.  Patient comfortable with the plan at discharge.  Vital signs within normal limits. ? ?  ?Margette Fast, MD ?08/31/21 1628 ? ?

## 2021-08-31 NOTE — ED Provider Triage Note (Signed)
Emergency Medicine Provider Triage Evaluation Note ? ?Cheryl Lara , a 41 y.o. female  was evaluated in triage.  Pt complains of sob.  Pt previously had covid with sxs of long covid.  Was going through physical therapy today when she developed chest tightness of heart racing with sob and tingling sensation through her arms.  Chest felt tight.  No fever, productive cough, hemoptysis ? ?Review of Systems  ?Positive: As above ?Negative: As above ? ?Physical Exam  ?BP 99/77 (BP Location: Right Arm)   Pulse (!) 112   Temp 98.1 ?F (36.7 ?C) (Oral)   Resp (!) 22   Ht '5\' 9"'$  (1.753 m)   Wt 68.9 kg   SpO2 100%   BMI 22.45 kg/m?  ?Gen:   Awake, no distress   ?Resp:  Normal effort  ?MSK:   Moves extremities without difficulty  ?Other:   ? ?Medical Decision Making  ?Medically screening exam initiated at 9:56 AM.  Appropriate orders placed.  Cheryl Lara was informed that the remainder of the evaluation will be completed by another provider, this initial triage assessment does not replace that evaluation, and the importance of remaining in the ED until their evaluation is complete. ? ? ?  ?Domenic Moras, PA-C ?08/31/21 1006 ? ?

## 2021-08-31 NOTE — Discharge Instructions (Addendum)
Please follow-up with your cardiologist to discuss your episode of pain that you experience today.  If you have recurrent pain, difficulty breathing or other new concerning symptom, come back to the emergency room for reassessment. ?

## 2021-08-31 NOTE — Telephone Encounter (Signed)
Will look for ER evaluation  ?

## 2021-08-31 NOTE — Telephone Encounter (Signed)
Called pt back, she is currently in the ED awaiting further work up of after being at physical therapy today and experiencing chest tightness, heart racing, and shortness of breath and tingling of her arms. Pt would like to know if there is anything additional that Dr. [name] would suggest. Explained to pt that this message would be routed to Dr. [name] and that she should complete are testing and treatment that the ED suggests. Explained to pt that if we need to see her back after ED visit we could schedule her an appointment. Pt verbalizes understanding.  ?

## 2021-08-31 NOTE — ED Provider Notes (Signed)
?[facility] ?Provider Note ? ? ?CSN: [id number] ?Arrival date & time: 08/31/21  0930 ? ?  ? ?History ? ?Chief Complaint  ?Patient presents with  ? Chest Pain  ? Tachycardia  ? ? ?Cheryl Lara is a 41 y.o. female.  Presenting to ER due to concern for chest pain.  Patient reports that she went on a walk with her kids this morning, did not have any symptoms at that time.  She went to physical therapy and while she was doing some exercises around 815 she had an episode of chest pain.  Radiated down her left arm and also slightly to her back.  Up to 8 or 9 out of 10 in severity, pressure, discomfort, cramping sensation.  Not tearing or ripping.  Also had associated tingling in the arms.  She states that her pain slowly has improved and now is quite minimal.  She did feel somewhat short of breath at time that it happened but now does not feel short of breath.  She states that her blood pressure was initially elevated which was quite abnormal for her. ?HPI ? ?  ? ?Home Medications ?Prior to Admission medications   ?Medication Sig Start Date End Date Taking? Authorizing Provider  ?albuterol (VENTOLIN HFA) 108 (90 Base) MCG/ACT inhaler Inhale into the lungs as needed. 10/26/20   [provider]  ?diltiazem (CARDIZEM CD) 120 MG 24 hr capsule Take 1 capsule (120 mg total) by mouth at bedtime. 08/28/21   Leonie Man, MD  ?diltiazem (CARDIZEM) 30 MG tablet Take 1 tablet (30 mg total) by mouth every 12 (twelve) hours as needed. FOR PALPATIONS 08/28/21   Leonie Man, MD  ?eletriptan (RELPAX) 40 MG tablet Take 1 tablet (40 mg total) by mouth as needed for migraine or headache. May repeat in 2 hours if headache persists or recurs. 02/27/21   Genia Harold, MD  ?methocarbamol (ROBAXIN) 500 MG tablet Take 1,000 mg by mouth every 8 (eight) hours as needed. 01/13/21   [provider]  ?metroNIDAZOLE (METROCREAM) 0.75 % cream Apply topically. 08/23/21   [provider]   ?mometasone (ELOCON) 0.1 % cream Apply topically 2 (two) times daily. 08/20/21   [provider]  ?MYDAYIS 50 MG CP24 Take 1 capsule by mouth every morning. 10/01/20   [provider]  ?pantoprazole (PROTONIX) 40 MG tablet TAKE 1 TABLET BY MOUTH TWICE A DAY 10/18/20   Zehr, Janett Billow D, PA-C  ?pimecrolimus (ELIDEL) 1 % cream Apply 1 application. topically 2 (two) times daily as needed. 08/23/21   [provider]  ?topiramate (TOPAMAX) 25 MG tablet Start Topamax for headache prevention. Take 25 mg (1 pill) at bedtime for one week, then increase to 50 mg (2 pills) at bedtime for one week, then take 75 mg (3 pills) at bedtime for one week, then take 100 mg (4 pills) at bedtime 08/24/21   Genia Harold, MD  ?traZODone (DESYREL) 50 MG tablet Take 50-100 mg by mouth at bedtime as needed. 01/13/21   [provider]  ?Ubrogepant (UBRELVY) 100 MG TABS Take 100 mg by mouth as needed. 08/24/21   Genia Harold, MD  ?   ? ?Allergies    ?Codeine, Cottonseed oil, Gluten meal, Other, and Nickel   ? ?Review of Systems   ?Review of Systems  ?Constitutional:  Positive for fatigue. Negative for chills and fever.  ?HENT:  Negative for ear pain and sore throat.   ?Eyes:  Negative for pain and  visual disturbance.  ?Respiratory:  Positive for chest tightness. Negative for cough and shortness of breath.   ?Cardiovascular:  Positive for chest pain. Negative for palpitations.  ?Gastrointestinal:  Positive for nausea. Negative for abdominal pain and vomiting.  ?Genitourinary:  Negative for dysuria and hematuria.  ?Musculoskeletal:  Negative for arthralgias and back pain.  ?Skin:  Negative for color change and rash.  ?Neurological:  Negative for seizures and syncope.  ?All other systems reviewed and are negative. ? ?Physical Exam ?Updated Vital Signs ?BP 120/61   Pulse 90   Temp 97.9 ?F (36.6 ?C) (Oral)   Resp (!) 21   Ht '5\' 9"'$  (1.753 m)   Wt 68.9 kg   SpO2 97%   BMI 22.45 kg/m?  ?Physical Exam ?Vitals and  nursing note reviewed.  ?Constitutional:   ?   General: She is not in acute distress. ?   Appearance: She is well-developed.  ?HENT:  ?   Head: Normocephalic and atraumatic.  ?Eyes:  ?   Conjunctiva/sclera: Conjunctivae normal.  ?Cardiovascular:  ?   Rate and Rhythm: Normal rate and regular rhythm.  ?   Heart sounds: No murmur heard. ?Pulmonary:  ?   Effort: Pulmonary effort is normal. No respiratory distress.  ?   Breath sounds: Normal breath sounds.  ?Abdominal:  ?   Palpations: Abdomen is soft.  ?   Tenderness: There is no abdominal tenderness.  ?Musculoskeletal:     ?   General: No swelling.  ?   Cervical back: Neck supple.  ?   Right lower leg: No edema.  ?   Left lower leg: No edema.  ?Skin: ?   General: Skin is warm and dry.  ?   Capillary Refill: Capillary refill takes less than 2 seconds.  ?Neurological:  ?   Mental Status: She is alert.  ?Psychiatric:     ?   Mood and Affect: Mood normal.  ? ? ?ED Results / Procedures / Treatments   ?Labs ?(all labs ordered are listed, but only abnormal results are displayed) ?Labs Reviewed  ?CBC - Abnormal; Notable for the following components:  ?    Result Value  ? WBC 11.8 (*)   ? All other components within normal limits  ?BASIC METABOLIC PANEL  ?D-DIMER, QUANTITATIVE  ?I-STAT BETA HCG BLOOD, ED ([facility], [facility], [facility] ONLY)  ?TROPONIN I (HIGH SENSITIVITY)  ?TROPONIN I (HIGH SENSITIVITY)  ? ? ?EKG ?EKG Interpretation ? ?Date/Time:  Thursday Aug 31 2021 13:44:43 EDT ?Ventricular Rate:  96 ?PR Interval:  159 ?QRS Duration: 98 ?QT Interval:  350 ?QTC Calculation: 443 ?R Axis:   81 ?Text Interpretation: Sinus rhythm Artifact in lead(s) II III aVR aVL aVF V6 Confirmed by Madalyn Rob [phone]) on 08/31/2021 2:10:54 PM ? ?Radiology ?DG Chest 2 View ? ?Result Date: 08/31/2021 ?CLINICAL DATA:  Chest pain, chest tightness, palpitations, shortness of breath, dizziness with history of a long COVID. EXAM: CHEST - 2 VIEW COMPARISON:  October 26, 2020 FINDINGS: The heart size and mediastinal  contours are within normal limits. Lungs are hyperinflated and clear. The visualized skeletal structures are unremarkable. IMPRESSION: No active cardiopulmonary disease. Lungs are hyperinflated and clear. Electronically Signed   By: Frazier Richards M.D.   On: 08/31/2021 10:42   ? ?Procedures ?Procedures  ? ? ?Medications Ordered in ED ?Medications - No data to display ? ?ED Course/ Medical Decision Making/ A&P ?  ?                        ?  Medical Decision Making ?Amount and/or Complexity of Data Reviewed ?Labs: ordered. ?Radiology: ordered. ? ? ?41 year old lady presenting to the emergency department due to concern for episode of chest pain.  On exam she is well-appearing in no acute distress.  Her EKG does not demonstrate any acute ischemic changes.  Her initial troponin is undetectable.  Given that her episode of pain occurred right before arrival, will plan to check repeat troponin.  No electrolyte derangement, no anemia.  I reviewed her CXR independently.  No pneumothorax or infiltrate appreciated.  Dimer within normal limits, doubt pulmonary embolism. ? ?While awaiting repeat troponin, signed out to Dr. Laverta Baltimore.  If normal, anticipate likely discharge. ? ? ? ? ? ? ? ?Final Clinical Impression(s) / ED Diagnoses ?Final diagnoses:  ?Chest pain, unspecified type  ? ? ?Rx / DC Orders ?ED Discharge Orders   ? ? None  ? ?  ? ? ?  ?Lucrezia Starch, MD ?08/31/21 1522 ? ?

## 2021-09-11 ENCOUNTER — Encounter: Payer: Self-pay | Admitting: Psychiatry

## 2021-09-15 ENCOUNTER — Ambulatory Visit: Payer: No Typology Code available for payment source | Admitting: Psychiatry

## 2021-09-20 NOTE — Progress Notes (Deleted)
   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  At her last visit, Relpax was reduced to 20 mg PRN. She was started on CoQ10 and B2 for headache prevention.  Interval History: Headaches continued, so occipital nerve block was done on 08/24/21. She was started on Topamax for headache prevention. Ubrelvy sample was provided.  She was unable to tolerate even low doses of Topamax due to side effects. Relpax***. She is interested in trying Botox***   Headache days per month: *** Headache free days per month: *** Headache severity: ***  Current Headache Regimen: Preventative: *** Abortive: ***   Prior Therapies                                  Robaxin  Celebrex Relpax Propranolol 5 mg BID - drowsiness Topamax 25 mg QHS - drowsiness, brain fog  Physical Exam:   Vital Signs: There were no vitals taken for this visit. GENERAL:  well appearing, in no acute distress, alert  SKIN:  Color, texture, turgor normal. No rashes or lesions HEAD:  Normocephalic/atraumatic. RESP: normal respiratory effort MSK:  No gross joint deformities.   NEUROLOGICAL: Mental Status: Alert, oriented to person, place and time, Follows commands, and Speech fluent and appropriate. Cranial Nerves: PERRL, face symmetric, no dysarthria, hearing grossly intact Motor: moves all extremities equally Gait: normal-based.  IMPRESSION: ***  PLAN: ***   Follow-up: ***  I spent a total of *** minutes on the date of the service. Headache education was done. Discussed lifestyle modification including increased oral hydration, decreased caffeine, exercise and stress management. Discussed treatment options including preventive and acute medications, natural supplements, and infusion therapy. Discussed medication overuse headache and to limit use of acute treatments to no more than 2 days/week or  10 days/month. Discussed medication side effects, adverse reactions and drug interactions. Written educational materials and patient instructions outlining all of the above were given.  Genia Harold, MD

## 2021-09-21 ENCOUNTER — Ambulatory Visit: Payer: No Typology Code available for payment source | Admitting: Psychiatry

## 2021-09-22 NOTE — Progress Notes (Signed)
Cardiology Office Note:    Date:  10/03/2021   ID:  Cheryl Lara, DOB 03-26-1981, MRN [id number]  PCP:  Cheryl Nakai, MD  Cardiologist:  Cheryl Hew, MD  Electrophysiologist:  None   Referring MD: Cheryl Nakai, MD   Chief Complaint: ED follow-up of chest pain  History of Present Illness:    Cheryl Lara is a 41 y.o. female with a history of mitral valve prolapse, palpitations with no significant arrhythmias noted on monitor in 01/2021, COVID infection in 10/2020 with long haul symptoms, GERD, iron deficiency anemia, and chronic Raynaud phenomenon who is followed by Dr. Ellyn Lara and presents today for ED follow-up of chest pain.  Patient was referred to Dr. Ellyn Lara in 01/2021 for further evaluation of multiple symptoms. At that time, patient reported chronic fatigue, hacking cough, and shortness of breath since having COVID in 10/2020. She also reported palpitations that she described as heart racing with rates in the 120s to 140s with associated lightheadedness/dizziness as well as some dyspnea and chest discomfort. CPX, Echo, and Zio Monitor were ordered for further evaluation. 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 CPX was normal with no evidence of significant cardiopulmonary limitation. Monitor showed underlying sinus rhythm with rare PACs/PVCs but no significant arrhythmias. Dyspnea was felt to be secondary to fatigue/ deconditioning from [place]. She was later started on Propranolol for ongoing palpitations.  Patient was last seen by Dr. Ellyn Lara on 08/28/2021 at which time she reported extreme fatigue with Propranolol and had stopped taking this altogether. She was still having episodes of increased heart rate with stress and panic and occasional chest pain at varius times throughout the day. Chest pain was not felt to be  cardiac in nature given normal CPX and Echo. She was started on Diltiazem for her palpitations.  She was seen in the ED a few days after this on 08/31/2021 for further evaluation of chest pain and tachycardia. Reviewed ED note: Patient developed an episode of 8-9/10 chest pain that radiated down her left arm and slightly to her back while working with PT. She also had some associated tingling in the arms. Work-up was unremarkable. EKG showed sinus tachycardia with no acute ischemic changes. High-sensitivity troponin was negative (<2, <2). D-dimer was negative. Pain resolved on its own and she was felt to be stable for discharge with outpatient Cardiology follow-up.  Patient presents today for follow-up. We discussed the episode of chest pain that led to recent ED visit. She states she woke up feeling fine that morning and went to PT. She initially did well with PT but then when she got on the stationary bike, her heart rate increased to the 180s and then she started have significant chest pain which is when she went to the ED. She continues to have intermittent chest pain with exertion or emotional stress but this always seems to occur after her heart rate increases. She  has some associated lightheadedness/dizziness with these palpitations but no syncope. Patient has noticed improvement in symptoms since she started avoiding high histamine foods. She also reports significant fatigue which she has had since COVID. She denies any significant shortness of breath. No orthopnea or PND. She does have chronic lower extremity edema. She recently started to accumulate a lot of fluid and noticed her weight was up about 10 lbs. Her  PCP started HCTZ for this and weight and lower extremity edema quickly improved.   Patient has noticed that her heart rate increases with position changes and PT told her to ask about POTS.   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   Dichorionic diamniotic twin pregnancy in third trimester 11/20/2014   GERD (gastroesophageal reflux disease)    HPV (human papilloma virus) infection    Iron deficiency anemia 11/27/2014   Postpartum care following cesarean delivery of Di-Di twins (8/6) 11/27/2014   Preterm uterine contractions in third trimester, antepartum 11/20/2014   Scarlet fever    hx of    Past Surgical History:  Procedure Laterality Date   CARDIOPULMONARY EXERCISE TEST (CPX)  02/13/2021   Normal functional capacity.  No cardiopulmonary abnormalities.  No evidence of any exercise-induced bronchospasm   CESAREAN SECTION N/A 11/27/2014   Procedure: CESAREAN SECTION;  Surgeon: Brien Few, MD;  Location: [facility];  Service: Obstetrics;  Laterality: N/A;   COLONOSCOPY     CRYOABLATION     LAPAROSCOPIC NISSEN FUNDOPLICATION  3154   polyp     removal 2009   TRANSTHORACIC ECHOCARDIOGRAM  02/10/2021   EF 60 to 65%.  No R WMA.  GR 1 DD?Cheryl Lara  Normal RV size and function.  Normal aortic valve.  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   TYMPANOSTOMY TUBE PLACEMENT     UPPER GASTROINTESTINAL ENDOSCOPY     WISDOM TOOTH EXTRACTION      Current Medications: Current Meds  Medication Sig   albuterol (VENTOLIN HFA) 108 (90 Base) MCG/ACT inhaler Inhale into the lungs as needed.   diltiazem (CARDIZEM) 30 MG tablet Take 1 tablet (30 mg total) by mouth every 12 (twelve) hours as needed. FOR PALPATIONS   furosemide (LASIX) 20 MG tablet Take 1 tablet (20 mg total) by mouth as needed.   methocarbamol (ROBAXIN) 500  MG tablet Take 1,000 mg by mouth every 8 (eight) hours as needed.   metoprolol tartrate (LOPRESSOR) 100 MG tablet Take 1.30-2 hours Prior to Scan.   metroNIDAZOLE (METROCREAM) 0.75 % cream Apply topically.   mometasone (ELOCON) 0.1 % cream Apply topically 2 (two) times daily.   MYDAYIS 50 MG CP24 Take 1 capsule by mouth every morning.   pantoprazole (PROTONIX) 40 MG tablet TAKE 1 TABLET BY MOUTH TWICE A DAY   pimecrolimus (ELIDEL) 1 % cream Apply 1 application. topically 2 (two) times daily as needed.   traZODone (DESYREL) 50 MG tablet Take 50-100 mg by mouth at bedtime as needed.   Ubrogepant (UBRELVY) 100 MG TABS Take 100 mg by mouth as needed (for migraine). May repeat a dose in 2 hours if headache persists. Max dose 2 pills in 24 hours   [DISCONTINUED] diltiazem (CARDIZEM CD) 120 MG 24 hr capsule Take 1 capsule (120 mg total) by mouth at bedtime.   [DISCONTINUED] hydrochlorothiazide (HYDRODIURIL) 12.5 MG tablet Take 12.5 mg by mouth daily.     Allergies:   Codeine, Cottonseed oil, Gluten  meal, Other, and Nickel   Social History   Socioeconomic History   Marital status: Married    Spouse name: [name]   Number of children: 3   Years of education: Not on file   Highest education level: Not on file  Occupational History   Not on file  Tobacco Use   Smoking status: Never   Smokeless tobacco: Never  Vaping Use   Vaping Use: Never used  Substance and Sexual Activity   Alcohol use: Yes    Comment: occ   Drug use: No   Sexual activity: Yes    Birth control/protection: None  Other Topics Concern   Not on file  Social History Narrative   She is married, mother of 3 children ages 56, 10 and 23.   Social Determinants of Radio broadcast assistant Strain: Not on file  Food Insecurity: Not on file  Transportation Needs: Not on file  Physical Activity: Not on file  Stress: Not on file  Social Connections: Not on file     Family History: The patient's family history includes  Breast cancer in her paternal grandmother; CAD in her mother; CAD (age of onset: 2) in her father; Cancer in her maternal grandmother and mother; Colon cancer in her maternal grandfather; Diabetes in her maternal grandmother; Diabetes type II in her maternal grandmother; Hypertension in her father and mother; Stroke in her paternal grandmother. There is no history of Esophageal cancer, Rectal cancer, or Stomach cancer.  ROS:   Please see the history of present illness.     EKGs/Labs/Other Studies Reviewed:    The following studies were reviewed:  Echocardiogram 02/10/2021: Impressions: 1. Left ventricular ejection fraction, by estimation, is 60 to 65%. The  left ventricle has normal function. The left ventricle has no regional  wall motion abnormalities. Left ventricular diastolic parameters are  consistent with Grade I diastolic  dysfunction (impaired relaxation).   2. Right ventricular systolic function is normal. The right ventricular  size is normal.   3. The mitral valve is myxomatous. No evidence of mitral valve  regurgitation. No evidence of mitral stenosis. There is mild holosystolic  prolapse of the middle segment of the anterior leaflet of the mitral  valve.   4. The aortic valve is normal in structure. Aortic valve regurgitation is  not visualized. No aortic stenosis is present.   5. The inferior vena cava is normal in size with greater than 50%  respiratory variability, suggesting right atrial pressure of 3 mmHg.  _______________  CPX 02/13/2021: Normal exercise test. No evidence of significant cardiopulmonary limitation. _______________  3 Day Zio Monitor 01/2021: Predominant underlying rhythm was Sinus Rhythm. Min HR of 56 bpm, max HR of 152 bpm, and avg HR of 93 bpm. 1 atrial run of 4 beats-128 bpm. Associated with change in P wave morphology consistent with ectopic atrial beats. Rare isolated PACs and PVCs. Occasional PAC couplets noted. No arrhythmias: No  sustained supraventricular tachycardia, atrial tachycardia, atrial fibrillation or atrial flutter. No ventricular tachycardia.  EKG:  EKG ordered today and demonstrates normal sinus rhythm, rate 77 bpm, with no acute ST/T changes. Normal axis. Normal PR and QRS intervals. QTc 423 ms.  Recent Labs: 08/31/2021: BUN 12; Creatinine, Ser 0.79; Hemoglobin 12.8; Platelets 252; Potassium 4.3; Sodium 141  Recent Lipid Panel No results found for: "CHOL", "TRIG", "HDL", "CHOLHDL", "VLDL", "LDLCALC", "LDLDIRECT"  Physical Exam:    Vital Signs: BP 114/72   Pulse 77   Ht '5\' 9"'$  (1.753 m)  Wt 158 lb 9.6 oz (71.9 kg)   SpO2 100%   BMI 23.42 kg/m     Wt Readings from Last 3 Encounters:  10/03/21 158 lb 9.6 oz (71.9 kg)  08/31/21 152 lb (68.9 kg)  08/28/21 158 lb 9.6 oz (71.9 kg)     General: 41 y.o. Caucasian female in no acute distress. HEENT: Normocephalic and atraumatic. Sclera clear.  Neck: Supple. No JVD. Heart: RRR. Distinct S1 and S2. No murmurs, gallops, or rubs. Lungs: No increased work of breathing. Clear to ausculation bilaterally. No wheezes, rhonchi, or rales.  Abdomen: Soft, non-distended, and non-tender to palpation.  Extremities: No lower extremity edema.    Skin: Warm and dry. Neuro: Alert and oriented x3. No focal deficits. Psych: Normal affect. Responds appropriately.  Assessment:    1. Chest pain, unspecified type   2. Palpitations   3. POTS (postural orthostatic tachycardia syndrome)   4. Mitral valve prolapse     Plan:    Chest Pain History of intermittent chest pain since COVID in 10/2020. CPX and Echo in 01/2021 were normal. She was recently seen in the ED after an episode of chest pain while working with PT. Work-up was unremarkable with normal EKG, troponin, and D-dimer.  - Patient continues to have intermittent chest pain that occurs with her palpitations. She also reports significant fatigue but has had this since she had COVID. - EKG today shows no acute  ischemic changes.  - I think her chest pain is likely secondary to palpitations. However, patient has a strong family history of CAD and is therefore understandably very concerned about this. Discussed ordering coronary calcium score vs full coronary CTA and patient preferred the coronary CTA. Therefore, I will order this. Will prescribe one time dose of Lopressor '100mg'$  for patient to take 1.5 to 2 hours before scan. Will also need BMET prior to this.  Palpitations POTS Monitor in 01/2021 showed rare PACs/PVCs but no significant arrhythmias. Unable to tolerate Propranolol due to extreme fatigue. - Patient continues to have intermittent palpitations as well as tachycardia with position changes. Orthostatic vital signs consistent with orthostatic tachycardia with heart rates increasing from 79 when supine to 118 when standing. - She has noticed improvement in symptoms since avoiding high histamine foods.  - Currently on Diltiazem '120mg'$  daily. However, she would like to stop this. She is only taking it every other day right now. I am OK with her stopping this completely (seems like she has had more benefit from avoiding high histamine foods than the Diltiazem).  She can continue short-acting Diltiazem '30mg'$  as needed for breakthrough palpitations.  - I recommended stopping HCTZ as this may worsening orthostatic tachycardia. Will prescribe Lasix '20mg'$  for patient to take as needed for lower extremity edema and weight gain instead. - Discussed conservative measures for POTS including increasing fluid intake, liberal salt intake, compression stocking, and increasing physical activity. I think continuing PT would be helpful.   Mitral Valve Prolapse Echo in 01/2021 showed myxomatous mitral valve with mild holosystolic prolapse of the middle segment of the anterior leaflet but no MR. - Plan is for repeat Echo in 2-3 years depending on symptoms or development of murmur.  Disposition: Follow up in 2-3 months  after coronary CTA.   Medication Adjustments/Labs and Tests Ordered: Current medicines are reviewed at length with the patient today.  Concerns regarding medicines are outlined above.  Orders Placed This Encounter  Procedures   CT CORONARY MORPH W/CTA COR W/SCORE W/CA W/CM &/OR WO/CM  Basic metabolic panel   EKG 42-PNTI   Meds ordered this encounter  Medications   furosemide (LASIX) 20 MG tablet    Sig: Take 1 tablet (20 mg total) by mouth as needed.    Dispense:  30 tablet    Refill:  2   metoprolol tartrate (LOPRESSOR) 100 MG tablet    Sig: Take 1.30-2 hours Prior to Scan.    Dispense:  1 tablet    Refill:  0    Patient Instructions  Medication Instructions:  Stop HCTZ. Stop Cardizem. Start Lasix 20 mg ( Take 1 Tablet As Needed  *If you need a refill on your cardiac medications before your next appointment, please call your pharmacy*   Lab Work: BMET Prior to CT Scan. If you have labs (blood work) drawn today and your tests are completely normal, you will receive your results only by: [place] (if you have MyChart) OR A paper copy in the mail If you have any lab test that is abnormal or we need to change your treatment, we will call you to review the results.    Follow-Up: At [facility], you and your health needs are our priority.  As part of our continuing mission to provide you with exceptional heart care, we have created designated Provider Care Teams.  These Care Teams include your primary Cardiologist (physician) and Advanced Practice Providers (APPs -  Physician Assistants and Nurse Practitioners) who all work together to provide you with the care you need, when you need it.  We recommend signing up for the patient portal called "MyChart".  Sign up information is provided on this After Visit Summary.  MyChart is used to connect with patients for Virtual Visits (Telemedicine).  Patients are able to view lab/test results, encounter notes, upcoming  appointments, etc.  Non-urgent messages can be sent to your provider as well.   To learn more about what you can do with MyChart, go to [email or web address].    Your next appointment:   2-3 month(s)  The format for your next appointment:   In Person  Provider:   Sande Rives, PA-C    Then, Cheryl Hew, MD will plan to see you again in 6 month(s).   Increase Fluid and Salt intake. Increase physical activity as tolerated. Recommend Compression Stockings.    Your cardiac CT will be scheduled at one of the below locations:   [facility] [address] [place], [place] [address] [phone]   If scheduled at [facility], please arrive at the [facility] (Entrance C2) of [facility] 30 minutes prior to test start time. You can use the FREE valet parking offered at entrance C (encouraged to control the heart rate for the test)  Proceed to the [facility] Radiology Department (first floor) to check-in and test prep.  All radiology patients and guests should use entrance C2 at [facility], accessed from [facility], even though the hospital's physical address listed is [address].    If scheduled at [facility], please arrive 15 mins early for check-in and test prep.  Please follow these instructions carefully (unless otherwise directed):  Hold all erectile dysfunction medications at least 3 days (72 hrs) prior to test.  On the Night Before the Test: Be sure to Drink plenty of water. Do not consume any caffeinated/decaffeinated beverages or chocolate 12 hours prior to your test. Do not take any antihistamines 12 hours prior to your  test. If the patient has contrast allergy: Patient will need a prescription for Prednisone and very clear instructions (as follows): Prednisone 50 mg - take 13 hours prior to test Take another Prednisone 50 mg 7 hours prior to test Take  another Prednisone 50 mg 1 hour prior to test Take Benadryl 50 mg 1 hour prior to test Patient must complete all four doses of above prophylactic medications. Patient will need a ride after test due to Benadryl.  On the Day of the Test: Drink plenty of water until 1 hour prior to the test. Do not eat any food 4 hours prior to the test. You may take your regular medications prior to the test.  Take metoprolol (Lopressor) two hours prior to test. HOLD Furosemide/Hydrochlorothiazide morning of the test. FEMALES- please wear underwire-free bra if available, avoid dresses & tight clothing   *For Clinical Staff only. Please instruct patient the following:* Heart Rate Medication Recommendations for Cardiac CT  Resting HR < 50 bpm  No medication  Resting HR 50-60 bpm and BP >110/50 mmHG   Consider Metoprolol tartrate 25 mg PO 90-120 min prior to scan  Resting HR 60-65 bpm and BP >110/50 mmHG  Metoprolol tartrate 50 mg PO 90-120 minutes prior to scan   Resting HR > 65 bpm and BP >110/50 mmHG  Metoprolol tartrate 100 mg PO 90-120 minutes prior to scan  Consider Ivabradine 10-15 mg PO or a calcium channel blocker for resting HR >60 bpm and contraindication to metoprolol tartrate  Consider Ivabradine 10-15 mg PO in combination with metoprolol tartrate for HR >80 bpm         After the Test: Drink plenty of water. After receiving IV contrast, you may experience a mild flushed feeling. This is normal. On occasion, you may experience a mild rash up to 24 hours after the test. This is not dangerous. If this occurs, you can take Benadryl 25 mg and increase your fluid intake. If you experience trouble breathing, this can be serious. If it is severe call 911 IMMEDIATELY. If it is mild, please call our office. If you take any of these medications: Glipizide/Metformin, Avandament, Glucavance, please do not take 48 hours after completing test unless otherwise instructed.  We will call to schedule your  test 2-4 weeks out understanding that some insurance companies will need an authorization prior to the service being performed.   For non-scheduling related questions, please contact the cardiac imaging nurse navigator should you have any questions/concerns: Marchia Bond, Cardiac Imaging Nurse Navigator Gordy Clement, Cardiac Imaging Nurse Navigator [facility] Direct Office Dial: [phone]   For scheduling needs, including cancellations and rescheduling, please call [place], [phone].   Important Information About Sugar         Signed, Eppie Gibson  10/03/2021 9:50 PM    [facility]

## 2021-09-24 ENCOUNTER — Encounter: Payer: Self-pay | Admitting: Cardiology

## 2021-09-24 NOTE — Assessment & Plan Note (Signed)
Unfortunately, she did not tolerate Inderal.  I am fearful that she is just [place] fatigue with any beta-blocker which may be part of the long COVID issue.  We will shift gears and use calcium channel blocker, especially with the thought that may be able to help her symptoms that seem consistent with Raynaud's  Plan: Start diltiazem XT 120 mg nightly with as needed 30 mg diltiazem short acting for breakthrough

## 2021-09-24 NOTE — Assessment & Plan Note (Signed)
Mild MR with MVP noted on echo in October 2022.  I do not think this necessarily has much to do with her sensation of palpitations.  However we will continue to monitor with echo in 2 to 3 years depending on symptoms and murmur

## 2021-09-24 NOTE — Assessment & Plan Note (Signed)
She describes having her hands mostly but also fingers and toes doing very painful and tense when it is cold.  Sounds like Raynaud's.  With the symptoms along with her palpitations, I have chosen to try calcium channel blockers as an option for treating tachycardia and Raynaud's. . Plan: Start diltiazem XT 120 mg daily with as needed short acting 30 mg diltiazem for breakthrough spells

## 2021-09-24 NOTE — Assessment & Plan Note (Signed)
Symptoms of been evaluated with a CPX and echocardiogram both are normal.  Most likely nonischemic/noncardiac symptoms.

## 2021-09-25 NOTE — Progress Notes (Addendum)
   Virtual Visit via Video Note  I connected with [name] on 09/26/21 at  9:00 AM EDT by a video enabled telemedicine application and verified that I am speaking with the correct person using two identifiers.  Location:  Patient: Home Provider: Office  I discussed the limitations of evaluation and management by telemedicine and the availability of in person appointments. The patient expressed understanding and agreed to proceed.  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  At her last visit Relpax was decreased to 20 mg PRN. She was started on CoQ10 and B2.  Interval History: Headaches persisted so she underwent occipital nerve block 06/13/21 and a second block on 08/24/21. This did help reduce her headache severity but not frequency. PT has been helping, especially dry needling. She was started on Topamax for headache prevention and Roselyn Meier was started for rescue. Roselyn Meier did help reduce her headache.  She was unable to tolerate Topamax due to brain fog and fatigue.  Presented to the ER on 08/31/21 for palpitations after PT. She was started on diltiazem by Cardiology.  Headache days per month: 15 Headache free days per month: 15  Current Headache Regimen: Preventative: none Abortive: Ubrelvy  Prior Therapies                                  Robaxin  Celebrex Relpax - side effects Propranolol 5 mg BID - drowsiness Topamax 25 mg QHS - brain fog, fatigue Ubrelvy - helpful Nurtec  Physical Exam:   Vital Signs: GENERAL:  well appearing, in no acute distress, alert  SKIN:  Color, texture, turgor normal. No rashes or lesions HEAD:  Normocephalic/atraumatic. RESP: normal respiratory effort MSK:  No gross joint deformities.   NEUROLOGICAL: Mental Status: Alert, oriented to person, place and time, Follows commands, and Speech fluent and  appropriate. Cranial Nerves: PERRL, face symmetric, no dysarthria, hearing grossly intact Motor: moves all extremities equally Gait: normal-based.  IMPRESSION: 41 year old female with a history of GERD who follows in clinic for post-COVID headaches and tremors. She has been unable to tolerate oral preventives or triptans even at low doses due to side effects. Will start Botox for migraine prevention and Ubrelvy for rescue.  PLAN: -Prevention: Start Botox -Rescue: Start Ubrelvy 100 mg PRN -Continue physical therapy for the neck  Follow-up: for Botox  I spent a total of 23 minutes on the date of the service. Headache education was done. Discussed treatment options including preventive and acute medications, and natural supplements.  Discussed medication side effects, adverse reactions and drug interactions. Written educational materials and patient instructions outlining all of the above were given.  Genia Harold, MD 09/26/21 9:31 AM

## 2021-09-26 ENCOUNTER — Telehealth (INDEPENDENT_AMBULATORY_CARE_PROVIDER_SITE_OTHER): Payer: No Typology Code available for payment source | Admitting: Psychiatry

## 2021-09-26 ENCOUNTER — Encounter: Payer: Self-pay | Admitting: Psychiatry

## 2021-09-26 ENCOUNTER — Telehealth: Payer: Self-pay | Admitting: *Deleted

## 2021-09-26 DIAGNOSIS — G43719 Chronic migraine without aura, intractable, without status migrainosus: Secondary | ICD-10-CM

## 2021-09-26 MED ORDER — UBRELVY 100 MG PO TABS
100.0000 mg | ORAL_TABLET | ORAL | 6 refills | Status: DC | PRN
Start: 2021-09-26 — End: 2022-08-09

## 2021-09-26 NOTE — Telephone Encounter (Signed)
Roselyn Meier PA, [place]: [place], [id number]. Faxed office notes to be attached.

## 2021-09-26 NOTE — Telephone Encounter (Signed)
Received e mail, documents attached. Received instant approval.  The request was approved for the following time period: 09/26/2021 - 09/26/2022.  Approval letter faxed to pharmacy.

## 2021-10-03 ENCOUNTER — Encounter: Payer: Self-pay | Admitting: Student

## 2021-10-03 ENCOUNTER — Ambulatory Visit (INDEPENDENT_AMBULATORY_CARE_PROVIDER_SITE_OTHER): Payer: No Typology Code available for payment source | Admitting: Student

## 2021-10-03 VITALS — BP 114/72 | HR 77 | Ht 69.0 in | Wt 158.6 lb

## 2021-10-03 DIAGNOSIS — R002 Palpitations: Secondary | ICD-10-CM | POA: Diagnosis not present

## 2021-10-03 DIAGNOSIS — I341 Nonrheumatic mitral (valve) prolapse: Secondary | ICD-10-CM | POA: Diagnosis not present

## 2021-10-03 DIAGNOSIS — R079 Chest pain, unspecified: Secondary | ICD-10-CM

## 2021-10-03 DIAGNOSIS — G90A Postural orthostatic tachycardia syndrome (POTS): Secondary | ICD-10-CM

## 2021-10-03 MED ORDER — METOPROLOL TARTRATE 100 MG PO TABS
ORAL_TABLET | ORAL | 0 refills | Status: DC
Start: 2021-10-03 — End: 2021-11-15

## 2021-10-03 MED ORDER — FUROSEMIDE 20 MG PO TABS: 20.0000 mg | ORAL_TABLET | ORAL | 2 refills | Status: DC | PRN

## 2021-10-03 NOTE — Patient Instructions (Addendum)
Medication Instructions:  Stop HCTZ. Stop Cardizem. Start Lasix 20 mg ( Take 1 Tablet As Needed  *If you need a refill on your cardiac medications before your next appointment, please call your pharmacy*   Lab Work: BMET Prior to CT Scan. If you have labs (blood work) drawn today and your tests are completely normal, you will receive your results only by: [place] (if you have MyChart) OR A paper copy in the mail If you have any lab test that is abnormal or we need to change your treatment, we will call you to review the results.    Follow-Up: At [facility], you and your health needs are our priority.  As part of our continuing mission to provide you with exceptional heart care, we have created designated Provider Care Teams.  These Care Teams include your primary Cardiologist (physician) and Advanced Practice Providers (APPs -  Physician Assistants and Nurse Practitioners) who all work together to provide you with the care you need, when you need it.  We recommend signing up for the patient portal called "MyChart".  Sign up information is provided on this After Visit Summary.  MyChart is used to connect with patients for Virtual Visits (Telemedicine).  Patients are able to view lab/test results, encounter notes, upcoming appointments, etc.  Non-urgent messages can be sent to your provider as well.   To learn more about what you can do with MyChart, go to [email or web address].    Your next appointment:   2-3 month(s)  The format for your next appointment:   In Person  Provider:   Sande Rives, PA-C    Then, Glenetta Hew, MD will plan to see you again in 6 month(s).   Increase Fluid and Salt intake. Increase physical activity as tolerated. Recommend Compression Stockings.    Your cardiac CT will be scheduled at one of the below locations:   [facility] [address] [place], [place] [address] [phone]   If scheduled at [facility], please arrive at the [facility] Entrance (Entrance C2) of [facility] 30 minutes prior to test start time. You can use the FREE valet parking offered at entrance C (encouraged to control the heart rate for the test)  Proceed to the [facility] Radiology Department (first floor) to check-in and test prep.  All radiology patients and guests should use entrance C2 at [facility], accessed from [facility], even though the hospital's physical address listed is [address]    If scheduled at [facility], please arrive 15 mins early for check-in and test prep.  Please follow these instructions carefully (unless otherwise directed):  Hold all erectile dysfunction medications at least 3 days (72 hrs) prior to test.  On the Night Before the Test: Be sure to Drink plenty of water. Do not consume any caffeinated/decaffeinated beverages or chocolate 12 hours prior to your test. Do not take any antihistamines 12 hours prior to your test. If the patient has contrast allergy: Patient will need a prescription for Prednisone and very clear instructions (as follows): Prednisone 50 mg - take 13 hours prior to test Take another Prednisone 50 mg 7 hours prior to test Take another Prednisone 50 mg 1 hour prior to test Take Benadryl 50 mg 1 hour prior to test Patient must complete all four doses of above prophylactic medications. Patient will need a ride after test due to Benadryl.  On the Day of the Test:  Drink plenty of water until 1 hour prior to the test. Do not eat any food 4 hours prior to the test. You may take your regular medications prior to the test.  Take metoprolol (Lopressor) two hours prior to test. HOLD Furosemide/Hydrochlorothiazide morning of the test. FEMALES- please wear underwire-free bra if available, avoid dresses & tight clothing   *For Clinical Staff only. Please instruct patient the  following:* Heart Rate Medication Recommendations for Cardiac CT  Resting HR < 50 bpm  No medication  Resting HR 50-60 bpm and BP >110/50 mmHG   Consider Metoprolol tartrate 25 mg PO 90-120 min prior to scan  Resting HR 60-65 bpm and BP >110/50 mmHG  Metoprolol tartrate 50 mg PO 90-120 minutes prior to scan   Resting HR > 65 bpm and BP >110/50 mmHG  Metoprolol tartrate 100 mg PO 90-120 minutes prior to scan  Consider Ivabradine 10-15 mg PO or a calcium channel blocker for resting HR >60 bpm and contraindication to metoprolol tartrate  Consider Ivabradine 10-15 mg PO in combination with metoprolol tartrate for HR >80 bpm         After the Test: Drink plenty of water. After receiving IV contrast, you may experience a mild flushed feeling. This is normal. On occasion, you may experience a mild rash up to 24 hours after the test. This is not dangerous. If this occurs, you can take Benadryl 25 mg and increase your fluid intake. If you experience trouble breathing, this can be serious. If it is severe call 911 IMMEDIATELY. If it is mild, please call our office. If you take any of these medications: Glipizide/Metformin, Avandament, Glucavance, please do not take 48 hours after completing test unless otherwise instructed.  We will call to schedule your test 2-4 weeks out understanding that some insurance companies will need an authorization prior to the service being performed.   For non-scheduling related questions, please contact the cardiac imaging nurse navigator should you have any questions/concerns: Marchia Bond, Cardiac Imaging Nurse Navigator Gordy Clement, Cardiac Imaging Nurse Navigator [facility] Direct Office Dial: [phone]   For scheduling needs, including cancellations and rescheduling, please call [place], [phone].   Important Information About Sugar

## 2021-10-23 ENCOUNTER — Telehealth (HOSPITAL_COMMUNITY): Payer: Self-pay | Admitting: *Deleted

## 2021-10-23 NOTE — Telephone Encounter (Signed)
Reaching out to patient to offer assistance regarding upcoming cardiac imaging study; pt verbalizes understanding of appt date/time, parking situation and where to check in, pre-test NPO status and medications ordered, and verified current allergies; name and call back number provided for further questions should they arise  Gordy Clement RN Navigator Cardiac Imaging [facility] [phone] office [phone] cell  Patient to take '100mg'$  metoprolol tartrate two hours prior to her cardiac CT scan.  She is aware to arrive at 1:30pm and to hold her lasix.

## 2021-10-25 ENCOUNTER — Ambulatory Visit (HOSPITAL_COMMUNITY)
Admission: RE | Admit: 2021-10-25 | Discharge: 2021-10-25 | Disposition: A | Discharge status: Home / Self Care | Payer: No Typology Code available for payment source | Source: Ambulatory Visit | Attending: Student | Admitting: Student

## 2021-10-25 DIAGNOSIS — R002 Palpitations: Secondary | ICD-10-CM | POA: Insufficient documentation

## 2021-10-25 DIAGNOSIS — R079 Chest pain, unspecified: Secondary | ICD-10-CM | POA: Diagnosis not present

## 2021-10-25 MED ORDER — IOHEXOL 350 MG/ML SOLN
100.0000 mL | Freq: Once | INTRAVENOUS | Status: AC | PRN
  Administered 2021-10-25: 100 mL via INTRAVENOUS

## 2021-10-25 MED ORDER — NITROGLYCERIN 0.4 MG SL SUBL
0.8000 mg | SUBLINGUAL_TABLET | Freq: Once | SUBLINGUAL | Status: AC
  Administered 2021-10-25: 0.8 mg via SUBLINGUAL

## 2021-10-25 MED ORDER — NITROGLYCERIN 0.4 MG SL SUBL
SUBLINGUAL_TABLET | SUBLINGUAL | Status: AC
  Filled 2021-10-25: qty 2

## 2021-11-15 ENCOUNTER — Ambulatory Visit: Payer: No Typology Code available for payment source | Admitting: Allergy and Immunology

## 2021-11-15 ENCOUNTER — Encounter: Payer: Self-pay | Admitting: Allergy and Immunology

## 2021-11-15 ENCOUNTER — Other Ambulatory Visit: Payer: Self-pay | Admitting: Allergy and Immunology

## 2021-11-15 VITALS — BP 112/62 | HR 89 | Resp 16 | Ht 69.0 in | Wt 160.4 lb

## 2021-11-15 DIAGNOSIS — I7300 Raynaud's syndrome without gangrene: Secondary | ICD-10-CM

## 2021-11-15 DIAGNOSIS — R232 Flushing: Secondary | ICD-10-CM

## 2021-11-15 DIAGNOSIS — L500 Allergic urticaria: Secondary | ICD-10-CM

## 2021-11-15 DIAGNOSIS — D8940 Mast cell activation, unspecified: Secondary | ICD-10-CM

## 2021-11-15 DIAGNOSIS — U099 Post covid-19 condition, unspecified: Secondary | ICD-10-CM

## 2021-11-15 DIAGNOSIS — G909 Disorder of the autonomic nervous system, unspecified: Secondary | ICD-10-CM

## 2021-11-15 MED ORDER — MONTELUKAST SODIUM 10 MG PO TABS: 10.0000 mg | ORAL_TABLET | Freq: Every day | ORAL | 5 refills | Status: DC

## 2021-11-15 MED ORDER — FAMOTIDINE 20 MG PO TABS: 20.0000 mg | ORAL_TABLET | Freq: Two times a day (BID) | ORAL | 5 refills | Status: DC

## 2021-11-15 MED ORDER — LORATADINE 10 MG PO TABS
ORAL_TABLET | ORAL | 5 refills | Status: DC
Start: 2021-11-15 — End: 2022-04-04

## 2021-11-15 NOTE — Progress Notes (Unsigned)
[place] [facility] [place] - [place]   Dear [name],  Thank you for referring Cheryl Lara to the [facility] [place] on 11/15/2021.   Below is a summation of this patient's evaluation and recommendations.  Thank you for your referral. I will keep you informed about this patient's response to treatment.   If you have any questions please do not hesitate to contact me.   Sincerely,  Jiles Prows, MD Allergy / Organ   ______________________________________________________________________   [name] PATIENT NOTE  Referring Provider: [name], MD Primary Provider: [name], MD Date of office visit: 11/15/2021    Subjective:   Chief Complaint:  Cheryl Lara (DOB: Oct 24, 1980) is a 41 y.o. female who presents to the clinic on 11/15/2021 with a chief complaint of Allergic Reaction and Headache .     HPI: [name] presents to this clinic in evaluation of problems that started after COVID infection approximately 1 year ago.  She had very severe COVID infection requiring hospitalization antiviral therapy and ever since that point in time she has been having a collection of various abnormalities.  She has been having very significant autonomic dysfunction with tachycardia and what sounds like POTS and very significant exercise intolerance with shortness of breath and exercise-induced tachycardia.  In addition, she has been having recurrent episodes of flushing and red blotches across her body that sometimes last several days but never heal with scar or hyperpigmentation and Raynaud's phenomena and distended belly and headaches.  She has seen multiple specialist regarding these issues and has had extensive evaluation regarding these issues.  She apparently has been receiving systemic steroid injections just about every month in the treatment of this issue.  She  receives systemic steroid injections for her headaches and then she received systemic steroid injections in her buttocks.  Her last injection was approximately 3 weeks ago.  Utilizing the systemic steroid injections and changing her diet to a "low histamine diet, while remaining gluten-free which she has been for years, and decreasing her caffeine consumption, she believes that she is doing better yet still continues to have all of the issues described above just with less intensity.  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   Dichorionic diamniotic twin pregnancy in third trimester 11/20/2014   GERD (gastroesophageal reflux disease)    HPV (human papilloma virus) infection    Iron deficiency anemia 11/27/2014   Postpartum care following cesarean delivery of Di-Di twins (8/6) 11/27/2014   Preterm uterine contractions in third trimester, antepartum 11/20/2014   Scarlet fever    hx of    Past Surgical History:  Procedure Laterality Date   CARDIOPULMONARY EXERCISE TEST (CPX)  02/13/2021   Normal functional capacity.  No cardiopulmonary abnormalities.  No evidence of any exercise-induced bronchospasm   CESAREAN SECTION N/A 11/27/2014   Procedure: CESAREAN SECTION;  Surgeon: Brien Few, MD;  Location: [facility];  Service: Obstetrics;  Laterality: N/A;   COLONOSCOPY     CRYOABLATION     LAPAROSCOPIC NISSEN FUNDOPLICATION  2637   polyp     removal 2009   TRANSTHORACIC ECHOCARDIOGRAM  02/10/2021  EF 60 to 65%.  No R WMA.  GR 1 DD?Marland Kitchen  Normal RV size and function.  Normal aortic valve.  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   TYMPANOSTOMY TUBE PLACEMENT     UPPER GASTROINTESTINAL ENDOSCOPY     WISDOM TOOTH EXTRACTION       Allergies as of 11/15/2021       Reactions   Codeine Nausea And Vomiting   Cottonseed Oil Other (See Comments)   Gluten Meal Other (See Comments)   Unknown   Nickel Dermatitis, Itching, Rash        Medication List    albuterol 108 (90 Base) MCG/ACT inhaler Commonly known as: VENTOLIN HFA Inhale into the lungs as needed.   methocarbamol 500 MG tablet Commonly known as: ROBAXIN Take 1,000 mg by mouth every 8 (eight) hours as needed.   metroNIDAZOLE 0.75 % cream Commonly known as: METROCREAM Apply topically.   mometasone 0.1 % cream Commonly known as: ELOCON Apply topically 2 (two) times daily.   Mydayis 50 MG Cp24 Generic drug: Amphet-Dextroamphet 3-Bead ER Take 1 capsule by mouth every morning.   pantoprazole 40 MG tablet Commonly known as: PROTONIX TAKE 1 TABLET BY MOUTH TWICE A DAY   pimecrolimus 1 % cream Commonly known as: ELIDEL Apply 1 application. topically 2 (two) times daily as needed.   Ubrelvy 100 MG Tabs Generic drug: Ubrogepant Take 100 mg by mouth as needed (for migraine). May repeat a dose in 2 hours if headache persists. Max dose 2 pills in 24 hours    Review of systems negative except as noted in HPI / PMHx or noted below:  Review of Systems  Constitutional: Negative.   HENT: Negative.    Eyes: Negative.   Respiratory: Negative.    Cardiovascular: Negative.   Gastrointestinal: Negative.   Genitourinary: Negative.   Musculoskeletal: Negative.   Skin: Negative.   Neurological: Negative.   Endo/Heme/Allergies: Negative.   Psychiatric/Behavioral: Negative.      Family History  Problem Relation Age of Onset   Cancer Mother    CAD Mother    Hypertension Mother    Hypertension Father    CAD Father 69       Followed by Dr. [name]; ~CABG @ age ~10   Cancer Maternal Grandmother    Diabetes Maternal Grandmother    Diabetes type II Maternal Grandmother    Colon cancer Maternal Grandfather    Breast cancer Paternal Grandmother     Stroke Paternal Grandmother    Esophageal cancer Neg Hx    Rectal cancer Neg Hx    Stomach cancer Neg Hx     Social History   Socioeconomic History   Marital status: Married    Spouse name: [name]   Number of children: 3   Years of education: Not on file   Highest education level: Not on file  Occupational History   Not on file  Tobacco Use   Smoking status: Never   Smokeless tobacco: Never  Vaping Use   Vaping Use: Never used  Substance and Sexual Activity   Alcohol use: Yes    Comment: occ   Drug use: No   Sexual activity: Yes    Birth control/protection: None  Other Topics Concern   Not on file  Social History Narrative   She is married, mother of 3 children ages 66, 31 and 53.   Environmental and Social history  Lives in a house with a dry environment, a dog located  inside the household, carpet in the bedroom, plastic on the bed, plastic on the pillow, and no smoking ongoing with inside the household.  She is a Forensic psychologist.  Objective:   Vitals:   11/15/21 0832  BP: 112/62  Pulse: 89  Resp: 16  SpO2: 99%   Height: '5\' 9"'$  (175.3 cm) Weight: 160 lb 6.4 oz (72.8 kg)  Physical Exam Constitutional:      Appearance: She is not diaphoretic.  HENT:     Head: Normocephalic.     Right Ear: Tympanic membrane, ear canal and external ear normal.     Left Ear: Tympanic membrane, ear canal and external ear normal.     Nose: Nose normal. No mucosal edema or rhinorrhea.     Mouth/Throat:     Pharynx: Uvula midline. No oropharyngeal exudate.  Eyes:     Conjunctiva/sclera: Conjunctivae normal.  Neck:     Thyroid: No thyromegaly.     Trachea: Trachea normal. No tracheal tenderness or tracheal deviation.  Cardiovascular:     Rate and Rhythm: Normal rate and regular rhythm.     Heart sounds: Normal heart sounds, S1 normal and S2 normal. No murmur heard. Pulmonary:     Effort: No respiratory distress.     Breath sounds: Normal breath sounds. No stridor. No wheezing or rales.   Lymphadenopathy:     Head:     Right side of head: No tonsillar adenopathy.     Left side of head: No tonsillar adenopathy.     Cervical: No cervical adenopathy.  Skin:    Findings: No erythema or rash.     Nails: There is no clubbing.  Neurological:     Mental Status: She is alert.    Diagnostics: Allergy skin tests were performed.   Spirometry was performed and demonstrated an FEV1 of *** @ *** % of predicted. FEV1/FVC = ***  The patient had an Asthma Control Test with the following results:  .     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 Raynaud's phenomenon without gangrene   3. Flushing   4. Allergic urticaria     Patient Instructions   1. Allergan avoidance measures ???  2. Treat and prevent immune activation:  A. Loratadine 10 mg - 2 tablets 2 times per day B. Famotidine 20 mg - 1 tablet 2 times per day C. Montelukast 10 mg - 1 tablet 1 time per day  3. Blood - tryptase, CRP, SED, ANA w/R, ANCA w/R, RF, CCP, C3, C4, AM cortisol  4. 24 hour urine - 5-HIAA, Histamine metabolites   5. Continue daily exercise program  6. Minimize caffeine consumption slowly  7. Return to clinic in 4 weeks or earlier if problem  8. Stress steroids ???   Jiles Prows, MD Allergy / Immunology [facility] [place]

## 2021-11-15 NOTE — Patient Instructions (Addendum)
  1. Allergan avoidance measures ???  2. Treat and prevent immune activation:  A. Loratadine 10 mg - 2 tablets 2 times per day B. Famotidine 20 mg - 1 tablet 2 times per day C. Montelukast 10 mg - 1 tablet 1 time per day  3. Blood - tryptase, CRP, SED, ANA w/R, ANCA w/R, RF, CCP, C3, C4, AM cortisol, TSH, FT4, thyroid peroxidase ab  4. 24 hour urine - 5-HIAA, Histamine metabolites   5. Continue daily exercise program  6. Minimize caffeine consumption slowly  7. Return to clinic in 4 weeks or earlier if problem  8. Stress steroids ???

## 2021-11-15 NOTE — Telephone Encounter (Signed)
Please advise to change from pepcid to tagament as insurance preferred insurance

## 2021-11-15 NOTE — Telephone Encounter (Signed)
Tried to call pt but voicemail box is full

## 2021-11-16 ENCOUNTER — Encounter: Payer: Self-pay | Admitting: Allergy and Immunology

## 2021-11-16 ENCOUNTER — Telehealth: Payer: Self-pay

## 2021-11-16 DIAGNOSIS — E0590 Thyrotoxicosis, unspecified without thyrotoxic crisis or storm: Secondary | ICD-10-CM

## 2021-11-16 NOTE — Telephone Encounter (Signed)
Called and informed patient of Dr. Bruna Potter message.  I told her that she will need to inform [facility] to draw the two different orders that we placed ( placed on two different days). Patient agreed with plan.

## 2021-11-16 NOTE — Telephone Encounter (Signed)
-----   Message from Jiles Prows, MD sent at 11/16/2021  7:19 AM EDT ----- Please add TSH, free T4, thyroid peroxidase antibody to yesterday's blood test.

## 2021-11-16 NOTE — Addendum Note (Signed)
Addended by: Zandra Abts on: 11/16/2021 05:12 PM   Modules accepted: Orders

## 2021-11-24 ENCOUNTER — Other Ambulatory Visit: Payer: Self-pay | Admitting: *Deleted

## 2021-11-24 ENCOUNTER — Telehealth: Payer: Self-pay | Admitting: Allergy and Immunology

## 2021-11-24 NOTE — Telephone Encounter (Signed)
Taisha from [facility] called regarding patients recent blood work order. She is needing the blood work reordered without the 24 urine test. She states they are not able to process them both together so they need to be ordered separately. She requests we make this change as soon as possible and give her a call back at [phone].

## 2021-11-27 NOTE — Progress Notes (Addendum)
Cardiology Office Note:    Date:  12/06/2021   ID:  Cheryl Lara, DOB 1980/08/01, MRN [id number]  PCP:  [name], MD  Cardiologist:  Glenetta Hew, MD  Electrophysiologist:  None   Referring MD: [name], MD   Chief Complaint: follow-up of chest pain and POTS   History of Present Illness:    Cheryl Lara is a 41 y.o. 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 [name] and presents today for follow-up of chest pain and POTS.  Patient was referred to Dr. [name] in 01/2021 for further evaluation of multiple symptoms. At that time, patient reported chronic fatigue, hacking cough, and shortness of breath since having COVID in 10/2020. She also reported palpitations that she described as heart racing with rates in the 120s to 140s with associated lightheadedness/dizziness as well as some dyspnea and chest discomfort. CPX, Echo, and Zio Monitor were ordered for further evaluation. 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 CPX was normal with no evidence of significant cardiopulmonary limitation. Monitor showed underlying sinus rhythm with rare PACs/PVCs but no significant arrhythmias. Dyspnea was felt to be secondary to fatigue/ deconditioning from [place]. She was later started on Propranolol for ongoing palpitations.   At visit with Dr. [name] in 08/2021, she reported extreme fatigue with Propranolol and had stopped taking this altogether. She was still having episodes of increased heart rate with stress and panic and occasional  chest pain at varius times throughout the day. Chest pain was not felt to be cardiac in nature given normal CPX and Echo. She was started on Diltiazem for her palpitations. She was seen in the ED a few days after this visit for chest pain and tachycardia. Pain radiated down her left arm and slightly to her back. EKG showed sinus tachycardia with no acute ischemic changes. High-sensitivity troponin and D-dimer were negative. She was felt to be stable for discharged with outpatient Cardiology follow-up. She was seen by me on 10/03/2021 for follow-up at which time she continued to have intermittent chest pain with exertion or emotion stress that always seemed to occur after her heart rate increased. Orthostatic vitals signs were consistent with orthostatic tachycardia. Patient wanted to discontinued long-acting Diltiazem because she had more benefit from avoiding high histamine foods; however, I recommended continuing short-acting Diltiazem as needed. I recommended stopping daily HCTZ (which she took for lower extremity edema) as this may worsen her orthostatic tachycardia and she was given PRN Lasix instead. Discussed conservative measures for POTS such as increasing fluid intake, liberal salt intake, compression stockings, and increasing physical activity. Her chest pain was felt to be secondary to palpitations. However, patient was very concerned about this given a strong family history of CAD so coronary CTA was ordered and showed a coronary calcium score of 0 with normal coronaries. Also showed a small PFO.  Patient presents today for follow-up. Patient reports no significant changes since last visit. She continues to have random episodes of chest pain both at rest and with exertion. She initially thought this was always preceded by  tachycardia/palpitations but now she does not think so. We reviewed coronary CTA results and discussed that this was very reassuring and suggestive that this is non-cardiac chest  pain. She denies any shortness of breath, orthopnea, or PND. She does have some intermittent lower extremity edema mostly in the hot weather. She continues to have tachypalpitations and lightheadedness/dizziness with standing but does report improvement in this as well as improvement in her headaches after following a low histamine diet. All of her symptoms started after she had COVID so we discussed that all of this may just be long haul COVID symptoms. She has been seeing an Allergist/Immunologist who felt like she has developed apparent immune activation since her COVID infection and has started her on a H1 and H2 receptor blocker as well as a leukotriene modifier to help minimize some of this immune activation.  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   Dichorionic diamniotic twin pregnancy in third trimester 11/20/2014   GERD (gastroesophageal reflux disease)    HPV (human papilloma virus) infection    Iron deficiency anemia 11/27/2014   Mitral valve prolapse    Postpartum care following cesarean delivery of Di-Di twins (8/6) 11/27/2014   Postural orthostatic tachycardia syndrome (POTS)    Preterm uterine contractions in third trimester, antepartum 11/20/2014   Scarlet fever    hx of    Past Surgical History:  Procedure Laterality Date   CARDIOPULMONARY EXERCISE TEST (CPX)  02/13/2021   Normal functional capacity.  No cardiopulmonary abnormalities.  No evidence of any exercise-induced bronchospasm   CESAREAN SECTION N/A 11/27/2014   Procedure: CESAREAN SECTION;  Surgeon: Brien Few, MD;  Location: [facility];  Service: Obstetrics;  Laterality: N/A;   COLONOSCOPY     CRYOABLATION     LAPAROSCOPIC NISSEN FUNDOPLICATION  [id number]   polyp      removal 2009   TRANSTHORACIC ECHOCARDIOGRAM  02/10/2021   EF 60 to 65%.  No R WMA.  GR 1 DD?Marland Kitchen  Normal RV size and function.  Normal aortic valve.  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   TYMPANOSTOMY TUBE PLACEMENT     UPPER GASTROINTESTINAL ENDOSCOPY     WISDOM TOOTH EXTRACTION      Current Medications: Current Meds  Medication Sig   albuterol (VENTOLIN HFA) 108 (90 Base) MCG/ACT inhaler Inhale into the lungs as needed.   famotidine (PEPCID) 20 MG tablet Take 1 tablet (20 mg total) by mouth 2 (two) times daily.   loratadine (CLARITIN) 10 MG tablet Take 2 tablets 2 times per day   metroNIDAZOLE (METROCREAM) 0.75 % cream Apply topically.   mometasone (ELOCON) 0.1 % cream Apply topically 2 (two) times daily.   montelukast (SINGULAIR) 10 MG tablet Take 1 tablet (10 mg total) by mouth at bedtime.   MYDAYIS 50 MG CP24 Take 1 capsule by mouth every morning.   pantoprazole (PROTONIX) 40 MG tablet TAKE 1 TABLET BY MOUTH TWICE A DAY   pimecrolimus (ELIDEL) 1 % cream Apply 1 application. topically 2 (two) times daily as needed.   Ubrogepant (UBRELVY) 100 MG TABS Take 100 mg by mouth as needed (for migraine). May repeat a dose in 2 hours if headache persists. Max dose 2 pills  in 24 hours     Allergies:   Codeine, Cottonseed oil, Gluten meal, and Nickel   Social History   Socioeconomic History   Marital status: Married    Spouse name: [name]   Number of children: 3   Years of education: Not on file   Highest education level: Not on file  Occupational History   Not on file  Tobacco Use   Smoking status: Never   Smokeless tobacco: Never  Vaping Use   Vaping Use: Never used  Substance and Sexual Activity   Alcohol use: Yes    Comment: occ   Drug use: No   Sexual activity: Yes    Birth control/protection: None  Other Topics Concern   Not on file  Social History Narrative   She is married, mother of 3 children ages 21, 7 and 65.   Social Determinants of  Radio broadcast assistant Strain: Not on file  Food Insecurity: Not on file  Transportation Needs: Not on file  Physical Activity: Not on file  Stress: Not on file  Social Connections: Not on file     Family History: The patient's family history includes Breast cancer in her paternal grandmother; CAD in her mother; CAD (age of onset: 66) in her father; Cancer in her maternal grandmother and mother; Colon cancer in her maternal grandfather; Diabetes in her maternal grandmother; Diabetes type II in her maternal grandmother; Hypertension in her father and mother; Stroke in her paternal grandmother. There is no history of Esophageal cancer, Rectal cancer, or Stomach cancer.  ROS:   Please see the history of present illness.     EKGs/Labs/Other Studies Reviewed:    The following studies were reviewed:  Echocardiogram 02/10/2021: Impressions: 1. Left ventricular ejection fraction, by estimation, is 60 to 65%. The  left ventricle has normal function. The left ventricle has no regional  wall motion abnormalities. Left ventricular diastolic parameters are  consistent with Grade I diastolic  dysfunction (impaired relaxation).   2. Right ventricular systolic function is normal. The right ventricular  size is normal.   3. The mitral valve is myxomatous. No evidence of mitral valve  regurgitation. No evidence of mitral stenosis. There is mild holosystolic  prolapse of the middle segment of the anterior leaflet of the mitral  valve.   4. The aortic valve is normal in structure. Aortic valve regurgitation is  not visualized. No aortic stenosis is present.   5. The inferior vena cava is normal in size with greater than 50%  respiratory variability, suggesting right atrial pressure of 3 mmHg.  _______________   CPX 02/13/2021: Normal exercise test. No evidence of significant cardiopulmonary limitation. _______________   3 Day Zio Monitor 01/2021: Predominant underlying rhythm was Sinus  Rhythm. Min HR of 56 bpm, max HR of 152 bpm, and avg HR of 93 bpm. 1 atrial run of 4 beats-128 bpm. Associated with change in P wave morphology consistent with ectopic atrial beats. Rare isolated PACs and PVCs. Occasional PAC couplets noted. No arrhythmias: No sustained supraventricular tachycardia, atrial tachycardia, atrial fibrillation or atrial flutter. No ventricular tachycardia. _______________  Coronary CTA 10/25/2021: Impression: 1. Coronary calcium score of 0. 2. Normal coronary origin with right dominance. 3. Normal coronary arteries. 4. Small PFO.   Recommendations: 1. No evidence of CAD (0%). Consider non-atherosclerotic causes of chest pain.   EKG:  EKG not ordered today.   Recent Labs: 08/31/2021: BUN 12; Creatinine, Ser 0.79; Hemoglobin 12.8; Platelets 252; Potassium 4.3; Sodium 141  Recent Lipid Panel No results found for: "CHOL", "TRIG", "HDL", "CHOLHDL", "VLDL", "LDLCALC", "LDLDIRECT"  Physical Exam:    Vital Signs: BP 112/78   Pulse 88   Ht '5\' 9"'$  (1.753 m)   Wt 160 lb 6.4 oz (72.8 kg)   SpO2 98%   BMI 23.69 kg/m     Wt Readings from Last 3 Encounters:  12/06/21 160 lb 6.4 oz (72.8 kg)  11/15/21 160 lb 6.4 oz (72.8 kg)  10/03/21 158 lb 9.6 oz (71.9 kg)     General: 41 y.o. Caucasian female in no acute distress. HEENT: Normocephalic and atraumatic. Sclera clear.  Neck: Supple. No JVD. Heart: RRR. Distinct S1 and S2. No murmurs, gallops, or rubs.  Lungs: No increased work of breathing. Clear to ausculation bilaterally. No wheezes, rhonchi, or rales.  Abdomen: Soft, non-distended, and non-tender to palpation.  Extremities: No lower extremity edema.    Skin: Warm and dry. Neuro: Alert and oriented x3. No focal deficits. Psych: Normal affect. Responds appropriately.  Assessment:    1. Chest pain of uncertain etiology   2. Palpitations   3. POTS (postural orthostatic tachycardia syndrome)   4. Mitral valve prolapse   5. PFO (patent foramen ovale)      Plan:    Chest Pain Patient has a history of intermittent chest pain since COVID infection in 10/2020 mostly associated with palpitations. CPX and Echo in 01/2021 were normal. Recent coronary CTA in 10/2021 showed coronary calcium score of 0 with normal coronaries.  - Continues to have chest pain both at rest and with exertion. Unchanged from prior visit. - Suspect non-cardiac chest pain. All of patient's symptoms started after she had COVID so this may just be from long haul COVID syndrome. No additional ischemic work-up necessary given normal coronary CTA. We discussed that a coronary calcium score of 0 means that her risk of having a heart attack in the next 5 years is low.   Palpitations POTS Patient has had palpitations since her COVID infection in 10/2020. Monitor in 01/2021 showed PACs/PVCs but no significant arrhythmias. Unable to tolerate Propranolol due to extreme fatigue. Previously on Diltiazem but asked to come off this as she did not feel like it was helping. Orthostatic vitals signs at last visit were positive for orthostatic tachycardia with heart rate increasing from 79 bpm when supine to 118 bpm when standing.  - She continues to have tachypalpitations and orthostatic symptoms with standing too quickly. However, she has had some improvement in symptoms since she starting avoid foods high in histamine.  -  She would like to avoid medications and try more holistic measures if possible. Continue conservative measures such as increasing fluid intake, liberal salt intake, compression stocking, and increasing physical activity. - Will discuss with Dr. Caryl Comes who has a POTS clinic to see if he recommends anything else at this time.   Mitral Valve Prolapse Echo in 01/2021 showed myxomatous mitral valve with mild holosystolic prolapse of the middle segment of the anterior leaflet but no MR. - Plan is for repeat Echo in 2-3 years depending on symptoms or development of murmur.  Small  PFO Incidentally noted on coronary CTA in 10/2021. No history of stroke. - No need for intervention/ closure at this time.  Disposition: Follow up in 1 year or sooner if needed.   Medication Adjustments/Labs and Tests Ordered: Current medicines are reviewed at length with the patient today.  Concerns regarding medicines are outlined above.  No orders of the defined types were  placed in this encounter.  No orders of the defined types were placed in this encounter.   Patient Instructions  Medication Instructions:  Your physician recommends that you continue on your current medications as directed. Please refer to the Current Medication list given to you today.   *If you need a refill on your cardiac medications before your next appointment, please call your pharmacy*   Lab Work: NONE ordered at this time of appointment   If you have labs (blood work) drawn today and your tests are completely normal, you will receive your results only by: [place] (if you have MyChart) OR A paper copy in the mail If you have any lab test that is abnormal or we need to change your treatment, we will call you to review the results.   Testing/Procedures: NONE ordered at this time of appointment     Follow-Up: At [facility], you and your health needs are our priority.  As part of our continuing mission to provide you with exceptional heart care, we have created designated Provider Care Teams.  These Care Teams include your primary Cardiologist (physician) and Advanced Practice Providers (APPs -  Physician Assistants and Nurse Practitioners) who all work together to provide you with the care you need, when you need it.  We recommend signing up for the patient portal called "MyChart".  Sign up information is provided on this After Visit Summary.  MyChart is used to connect with patients for Virtual Visits (Telemedicine).  Patients are able to view lab/test results, encounter notes, upcoming  appointments, etc.  Non-urgent messages can be sent to your provider as well.   To learn more about what you can do with MyChart, go to [email or web address].    Your next appointment:   1 year(s)  The format for your next appointment:   In Person  Provider:   Glenetta Hew, MD     Other Instructions   Important Information About Sugar         Signed, Darreld Mclean, PA-C  12/06/2021 5:28 PM    [place]

## 2021-11-30 NOTE — Telephone Encounter (Signed)
Left a message on Friday and again just now. Need to verify what the issue is.

## 2021-12-05 ENCOUNTER — Other Ambulatory Visit: Payer: Self-pay | Admitting: *Deleted

## 2021-12-05 ENCOUNTER — Encounter: Payer: Self-pay | Admitting: Student

## 2021-12-05 DIAGNOSIS — R232 Flushing: Secondary | ICD-10-CM

## 2021-12-05 DIAGNOSIS — G909 Disorder of the autonomic nervous system, unspecified: Secondary | ICD-10-CM

## 2021-12-05 DIAGNOSIS — L500 Allergic urticaria: Secondary | ICD-10-CM

## 2021-12-05 DIAGNOSIS — E0590 Thyrotoxicosis, unspecified without thyrotoxic crisis or storm: Secondary | ICD-10-CM

## 2021-12-05 DIAGNOSIS — I7300 Raynaud's syndrome without gangrene: Secondary | ICD-10-CM

## 2021-12-05 DIAGNOSIS — D8940 Mast cell activation, unspecified: Secondary | ICD-10-CM

## 2021-12-05 NOTE — Telephone Encounter (Signed)
New orders for blood work have been sent.

## 2021-12-06 ENCOUNTER — Encounter: Payer: Self-pay | Admitting: Student

## 2021-12-06 ENCOUNTER — Ambulatory Visit (INDEPENDENT_AMBULATORY_CARE_PROVIDER_SITE_OTHER): Payer: No Typology Code available for payment source | Admitting: Student

## 2021-12-06 VITALS — BP 112/78 | HR 88 | Ht 69.0 in | Wt 160.4 lb

## 2021-12-06 DIAGNOSIS — R079 Chest pain, unspecified: Secondary | ICD-10-CM | POA: Diagnosis not present

## 2021-12-06 DIAGNOSIS — G90A Postural orthostatic tachycardia syndrome (POTS): Secondary | ICD-10-CM | POA: Diagnosis not present

## 2021-12-06 DIAGNOSIS — R002 Palpitations: Secondary | ICD-10-CM | POA: Diagnosis not present

## 2021-12-06 DIAGNOSIS — I341 Nonrheumatic mitral (valve) prolapse: Secondary | ICD-10-CM

## 2021-12-06 DIAGNOSIS — Q2112 Patent foramen ovale: Secondary | ICD-10-CM

## 2021-12-06 NOTE — Patient Instructions (Signed)
Medication Instructions:  ?Your physician recommends that you continue on your current medications as directed. Please refer to the Current Medication list given to you today.  ? ?*If you need a refill on your cardiac medications before your next appointment, please call your pharmacy* ? ? ?Lab Work: ?NONE ordered at this time of appointment  ? ?If you have labs (blood work) drawn today and your tests are completely normal, you will receive your results only by: ?MyChart Message (if you have MyChart) OR ?A paper copy in the mail ?If you have any lab test that is abnormal or we need to change your treatment, we will call you to review the results. ? ? ?Testing/Procedures: ?NONE ordered at this time of appointment  ? ? ? ?Follow-Up: ?At [facility], you and your health needs are our priority.  As part of our continuing mission to provide you with exceptional heart care, we have created designated Provider Care Teams.  These Care Teams include your primary Cardiologist (physician) and Advanced Practice Providers (APPs -  Physician Assistants and Nurse Practitioners) who all work together to provide you with the care you need, when you need it. ? ?We recommend signing up for the patient portal called "MyChart".  Sign up information is provided on this After Visit Summary.  MyChart is used to connect with patients for Virtual Visits (Telemedicine).  Patients are able to view lab/test results, encounter notes, upcoming appointments, etc.  Non-urgent messages can be sent to your provider as well.   ?To learn more about what you can do with MyChart, go to [email or web address].   ? ?Your next appointment:   ?1 year(s) ? ?The format for your next appointment:   ?In Person ? ?Provider:   ?David Harding, MD   ? ? ?Other Instructions ? ? ?Important Information About Sugar ? ? ? ? ? ? ?

## 2021-12-07 ENCOUNTER — Telehealth: Payer: Self-pay | Admitting: *Deleted

## 2021-12-07 NOTE — Telephone Encounter (Signed)
Called [employer], spoke with Rosann Auerbach for botox auth. She stated no auth needed as of 07/22/21. She was unsure if buy and bill or specialty pharmacy. Need to call [phone], opt 1, ext [phone]. Ref [id number] for this call.  Called [employer], spoke with Alma Downs who stated patient has no specialty pharmacy benefit. Botox will be buy and bill.

## 2021-12-08 LAB — HISTAMINE, 24 HOUR URINE
Histamine,ug/24hr,U: 7 ug/24 hr (ref 0–65)
Histamine,ug/L,U: 4 ug/L

## 2021-12-08 LAB — THYROID PEROXIDASE ANTIBODY: Thyroperoxidase Ab SerPl-aCnc: 28 IU/mL (ref 0–34)

## 2021-12-08 LAB — TSH+FREE T4
Free T4: 1.55 ng/dL (ref 0.82–1.77)
TSH: 0.658 u[IU]/mL (ref 0.450–4.500)

## 2021-12-08 NOTE — Telephone Encounter (Signed)
Pt has been scheduled for initial botox with Dr. Billey Gosling on 9/7

## 2021-12-09 LAB — 5 HIAA, QUANTITATIVE, URINE, 24 HOUR
5-HIAA, Ur: 2.8 mg/L
5-HIAA,Quant.,24 Hr Urine: 4.8 mg/24 hr (ref 0.0–14.9)

## 2021-12-10 LAB — CORTISOL-AM, BLOOD: Cortisol - AM: 8.9 ug/dL (ref 6.2–19.4)

## 2021-12-10 LAB — ANCA TITERS
Atypical pANCA: <1:20 {titer}
C-ANCA: <1:20 {titer}
P-ANCA: <1:20 {titer}

## 2021-12-10 LAB — C3 AND C4
Complement C3, Serum: 96 mg/dL (ref 82–167)
Complement C4, Serum: 18 mg/dL (ref 12–38)

## 2021-12-10 LAB — ANA W/REFLEX IF POSITIVE: Anti Nuclear Antibody (ANA): NEGATIVE

## 2021-12-10 LAB — RHEUMATOID FACTOR: Rheumatoid fact SerPl-aCnc: <10 IU/mL (ref <14.0)

## 2021-12-10 LAB — TSH+FREE T4
Free T4: 1.46 ng/dL (ref 0.82–1.77)
TSH: 0.668 u[IU]/mL (ref 0.450–4.500)

## 2021-12-10 LAB — THYROID PEROXIDASE ANTIBODY: Thyroperoxidase Ab SerPl-aCnc: 30 IU/mL (ref 0–34)

## 2021-12-10 LAB — TRYPTASE: Tryptase: 4.6 ug/L (ref 2.2–13.2)

## 2021-12-10 LAB — CYCLIC CITRUL PEPTIDE ANTIBODY, IGG/IGA: Cyclic Citrullin Peptide Ab: 3 units (ref 0–19)

## 2021-12-10 LAB — SEDIMENTATION RATE: Sed Rate: 2 mm/hr (ref 0–32)

## 2021-12-10 LAB — C-REACTIVE PROTEIN: CRP: <1 mg/L (ref 0–10)

## 2021-12-15 NOTE — Progress Notes (Signed)
Sounds like you have done a bunch  A couple of targets of therapy 2-3 gms of 'sodium/day ( 5-7 gms NaCL) Compression of thighs and abdomen, calf does not so much  Glad to see

## 2021-12-17 DIAGNOSIS — G90A Postural orthostatic tachycardia syndrome (POTS): Secondary | ICD-10-CM

## 2021-12-18 NOTE — Telephone Encounter (Signed)
Thanks so much Almyra Free! Referral is for further evaluation/treatment of postural orthostatic tachycardia syndrome (POTS).  Thank you! Alden Bensinger

## 2021-12-19 ENCOUNTER — Other Ambulatory Visit: Payer: Self-pay

## 2021-12-20 ENCOUNTER — Ambulatory Visit: Payer: No Typology Code available for payment source | Admitting: Student

## 2021-12-21 ENCOUNTER — Encounter: Payer: Self-pay | Admitting: Allergy and Immunology

## 2021-12-21 ENCOUNTER — Ambulatory Visit (INDEPENDENT_AMBULATORY_CARE_PROVIDER_SITE_OTHER): Payer: No Typology Code available for payment source | Admitting: Allergy and Immunology

## 2021-12-21 VITALS — BP 110/72 | HR 96 | Resp 16

## 2021-12-21 DIAGNOSIS — R232 Flushing: Secondary | ICD-10-CM

## 2021-12-21 DIAGNOSIS — G909 Disorder of the autonomic nervous system, unspecified: Secondary | ICD-10-CM | POA: Diagnosis not present

## 2021-12-21 DIAGNOSIS — U099 Post covid-19 condition, unspecified: Secondary | ICD-10-CM

## 2021-12-21 NOTE — Progress Notes (Signed)
[place] [facility] [place] [facility] [place]   Follow-up Note  Referring Provider: [name], MD Primary Provider: [name], MD Date of Office Visit: 12/21/2021  Subjective:   Cheryl Lara (DOB: [birth year]) is a 41 y.o. female who returns to the [facility] on 12/21/2021 in re-evaluation of the following:  HPI: Buffey returns to this clinic in evaluation of post COVID syndrome manifested as autonomic dysfunction.  She still continues to have intermittent issues with tachycardia and shortness of breath and recurrent blotchy flushing episodes and may be some of these are related to eating specific foods and maybe they are not related to eating specific foods.  She has visited with her cardiologist and she is seeing another cardiologist for second opinion regarding her POTS.  She is not really exercising to any degree for when she does exercise she gets "wiped out".  She has not tried the plan of an H1 and H2 receptor blocker and a leukotriene modifier to see if this helps decrease the amount of flushing episodes that she develops.  She has minimized her caffeine to half a cup of coffee but still consumes chocolate.  Fortunately she has not received any more systemic steroids.  Allergies as of 12/21/2021       Reactions   Codeine Nausea And Vomiting   Cottonseed Oil Other (See Comments)   Gluten Meal Other (See Comments)   Unknown   Nickel Dermatitis, Itching, Rash        Medication List    albuterol 108 (90 Base) MCG/ACT inhaler Commonly known as: VENTOLIN HFA Inhale into the lungs as needed.   famotidine 20 MG tablet Commonly known as: PEPCID Take 1 tablet (20 mg total) by mouth 2 (two) times daily.   fluocinonide 0.05 % external solution Commonly known as: LIDEX Apply 1 Application topically 2 (two) times daily.   loratadine 10 MG tablet Commonly known as: CLARITIN Take 2 tablets 2 times per day   metroNIDAZOLE 0.75 % cream Commonly  known as: METROCREAM Apply topically.   mometasone 0.1 % cream Commonly known as: ELOCON Apply topically 2 (two) times daily.   montelukast 10 MG tablet Commonly known as: SINGULAIR Take 1 tablet (10 mg total) by mouth at bedtime.   Mydayis 50 MG Cp24 Generic drug: Amphet-Dextroamphet 3-Bead ER Take 1 capsule by mouth every morning.   pantoprazole 40 MG tablet Commonly known as: PROTONIX TAKE 1 TABLET BY MOUTH TWICE A DAY   pimecrolimus 1 % cream Commonly known as: ELIDEL Apply 1 application. topically 2 (two) times daily as needed.   Ubrelvy 100 MG Tabs Generic drug: Ubrogepant Take 100 mg by mouth as needed (for migraine). May repeat a dose in 2 hours if headache persists. 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   Dichorionic diamniotic twin pregnancy in third trimester 11/20/2014   GERD (gastroesophageal reflux disease)    HPV (human papilloma virus) infection    Iron deficiency anemia 11/27/2014   Mitral valve prolapse    Postpartum care following cesarean delivery of Di-Di twins (8/6) 11/27/2014   Postural orthostatic tachycardia syndrome (POTS)    Preterm uterine contractions  in third trimester, antepartum 11/20/2014   Scarlet fever    hx of    Past Surgical History:  Procedure Laterality Date   CARDIOPULMONARY EXERCISE TEST (CPX)  02/13/2021   Normal functional capacity.  No cardiopulmonary abnormalities.  No evidence of any exercise-induced bronchospasm   CESAREAN SECTION N/A 11/27/2014   Procedure: CESAREAN SECTION;  Surgeon: Brien Few, MD;  Location: [facility];  Service: Obstetrics;  Laterality: N/A;   COLONOSCOPY     CRYOABLATION     LAPAROSCOPIC NISSEN FUNDOPLICATION   5852   polyp     removal 2009   TRANSTHORACIC ECHOCARDIOGRAM  02/10/2021   EF 60 to 65%.  No R WMA.  GR 1 DD?Marland Kitchen  Normal RV size and function.  Normal aortic valve.  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   TYMPANOSTOMY TUBE PLACEMENT     UPPER GASTROINTESTINAL ENDOSCOPY     WISDOM TOOTH EXTRACTION      Review of systems negative except as noted in HPI / PMHx or noted below:  Review of Systems  Constitutional: Negative.   HENT: Negative.    Eyes: Negative.   Respiratory: Negative.    Cardiovascular: Negative.   Gastrointestinal: Negative.   Genitourinary: Negative.   Musculoskeletal: Negative.   Skin: Negative.   Neurological: Negative.   Endo/Heme/Allergies: Negative.   Psychiatric/Behavioral: Negative.       Objective:   Vitals:   12/21/21 0853  BP: 110/72  Pulse: 96  Resp: 16  SpO2: 99%          Physical Exam -deferred  Diagnostics:    Results of blood tests obtained 07 December 2021 identifies negative ANA, thyroid peroxidase antibody 30 U/mL, TSH 0.668 IU/mL, free T41.46 NG/DL, negative ANCA, sed rate 2, a.m. cortisol 8.9 UG/DL, C3 96 mg/DL, C4 18 mg/DL, CCP 3 units rheumatoid factor less than 10 U/mL, C-reactive protein less than 1 Mg/L, tryptase 4.6 UG/L.  Results of a 24-hour urine collection identified histamine 7 UG/24 hours, 5-HIAA 4.8 mg / 24 hours  Assessment and Plan:   1. Post-COVID syndrome   2. Autonomic dysfunction   3. Flushing     1. Can try the following combination:  A. Loratadine 10 mg - 2 tablets 2 times per day B. Famotidine 20 mg - 1 tablet 2 times per day C. Montelukast 10 mg - 1 tablet 1 time per day  2. Slow and steady daily exercise program with future escalation  3. Minimize caffeine consumption slowly  4. No more systemic steroids  5. Plan for fall flu vaccine  6. Contact clinic if problem  I think that Dailin's main problem ever since she had her COVID infection is the fact that she has autonomic  dysfunction giving rise the problems in her cardiovascular control and capillary blood flow control.  She is certainly welcome to try a combination of an H1 and H2 receptor blocker and a leukotriene modifier which may help her skin issue to some degree.  I think the best thing that she can do in the long-term is to undergo a slow and steady daily exercise program that increases the intensity of the exercise over time.  As well, she should minimize any caffeine consumption and that includes her chocolate hobby and of course not receive any more systemic steroids as she has had extensive systemic steroids administered in the past.  She is going to be working with her cardiologist regarding her POTS.  I will be available to address  any issues she may have in the future.  Allena Katz, MD Allergy / Immunology [place]

## 2021-12-21 NOTE — Patient Instructions (Signed)
  1. Can try the following combination:  A. Loratadine 10 mg - 2 tablets 2 times per day B. Famotidine 20 mg - 1 tablet 2 times per day C. Montelukast 10 mg - 1 tablet 1 time per day  2. Slow and steady daily exercise program with future escalation  3. Minimize caffeine consumption slowly  4. No more systemic steroids  5. Plan for fall flu vaccine  6. Contact clinic if problem

## 2021-12-21 NOTE — Telephone Encounter (Signed)
Referral placed  Thanks!

## 2021-12-26 ENCOUNTER — Encounter: Payer: Self-pay | Admitting: Allergy and Immunology

## 2021-12-28 ENCOUNTER — Ambulatory Visit (INDEPENDENT_AMBULATORY_CARE_PROVIDER_SITE_OTHER): Payer: No Typology Code available for payment source | Admitting: Psychiatry

## 2021-12-28 VITALS — BP 116/73 | HR 88

## 2021-12-28 DIAGNOSIS — G43719 Chronic migraine without aura, intractable, without status migrainosus: Secondary | ICD-10-CM

## 2021-12-28 MED ORDER — ONABOTULINUMTOXINA 100 UNITS IJ SOLR
155.0000 [IU] | Freq: Once | INTRAMUSCULAR | Status: AC
  Administered 2021-12-28: 155 [IU] via INTRAMUSCULAR

## 2021-12-28 NOTE — Progress Notes (Signed)
Botox- 200 units x 1 vial Lot: [id number] Expiration: 08/2023 NDC: 1102-1117-35  Bacteriostatic 0.9% Sodium Chloride- 62m total Lot: [id number]: 23 Dec 2022 NDC: [address]-88 Dx: GI75.797B/B

## 2021-12-28 NOTE — Progress Notes (Signed)
[facility]  BOTULINUM TOXIN INJECTION PROCEDURE NOTE  Patient: Cheryl Lara MRN: [id number]  Indication: Chronic migraines   History: 41 year old female with a history of GERD who follows in clinic for post-COVID headache and tremors. 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  Last injection: First injection today  Technique: Informed consent was obtained and signed. 200 units onabotulinumtoxinA (Lot# [id number]; Expiration: 08/2023) were reconstituted using normal saline, to a concentration of 5 units per 0.76m. 155 units were injected using sterile technique across 31 sites as follows: corrugator 10, procerus 5 units, frontalis 20 units, temporalis 40 units, occipitalis 30 units, cervical paraspinal 20 units, trapezius 30 units. 45 units were wasted. Patient tolerated procedure without complication.  Prior Therapies                                  Rescue: Robaxin  Celebrex Relpax - side effects Ubrelvy - helpful Nurtec  Preventive: Propranolol 5 mg BID - drowsiness Topamax 25 mg QHS - brain fog, fatigue Botox  Plan: -Rescue: Continue Ubrelvy 100 mg PRN -Return for Botox in 3 months  Cheryl Harold9/7/23 2:26 PM

## 2022-01-18 ENCOUNTER — Telehealth: Payer: Self-pay | Admitting: *Deleted

## 2022-01-18 NOTE — Telephone Encounter (Signed)
Botox PA signed and faxed as urgent to [facility]. Received confirmation.

## 2022-01-18 NOTE — Telephone Encounter (Signed)
Per Angie, billing dept I need to call insurance for PA for botox that was administered on 12/28/21. [facility] with [facility], she stated they faxed a form to be completed. I got form, she advised it be filled out and when Rx sent to pharmacy we get notice to approve service. I advised her patient received drug on 12/28/21 buy and bill after I was told she didn't have specialty pharmacy benefit. This call is to do PA for J code for past service.  Brayton Layman stated that the first thing that has to be done is to complete Botox PA form we received, PA [phone]. Call [facility] back once its approved to back date to 12/28/21 if approved before 01/27/22. We then reach [facility] to process as buy and bill.  Botox PA form completed, placed on MD desk for review, signature.

## 2022-01-22 NOTE — Telephone Encounter (Signed)
Called [facility], spoke with [place] PA dept, who confirmed botox approved 01/18/22 - 07/19/22. She submitted the back dated form to 12/28/21, stated 24-48 hours for approval. She reached out to [place] team with patient's insurance who stated once back date request is approved I need to call [facility], may be ready in 24-48 hours. Ph [phone].

## 2022-01-22 NOTE — Telephone Encounter (Signed)
Received fax from [facility]: Botox approved 01/18/22 - 07/19/22.  Will call [facility] to confirm approval.

## 2022-01-23 ENCOUNTER — Ambulatory Visit: Payer: No Typology Code available for payment source | Admitting: Cardiology

## 2022-01-23 NOTE — Telephone Encounter (Signed)
Called [facility], spoke with Ariel who stated I need to call provider service unit, [phone], enter pass code [id number] Called #, reached [facility] for [employer]. Spoke with Jerilynn Mages, who stated he is medical, needs to transfer call to [facility] p [phone]. Ref # for this call is today's date time 4:26 pm, Jerilynn Mages. He transferred call,  spoke with Isaias Sakai who stated botox approval was back dated to 12/19/21 with over ride  PA [phone]. She stated that I need to call plan to get reimbursement for buy and bill. Will call tomorrow.

## 2022-01-24 NOTE — Telephone Encounter (Signed)
Called [facility], spoke with Tammy who stated [facility] doesn't do retro claims unless it is appealed or is a Designer, multimedia, [place] provider or out of network provider.  Coralie Common, billing dept.

## 2022-03-05 ENCOUNTER — Telehealth: Payer: Self-pay

## 2022-03-05 NOTE — Telephone Encounter (Signed)
Received fax from [facility] for care. Placed on MD desk for Sign and Review to be faxed back.

## 2022-03-07 ENCOUNTER — Other Ambulatory Visit: Payer: Self-pay | Admitting: Psychiatry

## 2022-03-07 ENCOUNTER — Telehealth: Payer: Self-pay | Admitting: Psychiatry

## 2022-03-07 DIAGNOSIS — G8929 Other chronic pain: Secondary | ICD-10-CM

## 2022-03-07 MED ORDER — NARATRIPTAN HCL 2.5 MG PO TABS
2.5000 mg | ORAL_TABLET | ORAL | 6 refills | Status: AC | PRN
Start: 2022-03-07 — End: ?

## 2022-03-07 NOTE — Telephone Encounter (Signed)
Referral for pain clinic fax to [facility]. Phone: [phone], Fax: [phone]. 

## 2022-03-23 ENCOUNTER — Ambulatory Visit: Payer: No Typology Code available for payment source | Admitting: Cardiology

## 2022-03-26 ENCOUNTER — Ambulatory Visit: Payer: No Typology Code available for payment source | Admitting: Psychiatry

## 2022-03-28 HISTORY — PX: OTHER SURGICAL HISTORY: SHX169

## 2022-04-03 ENCOUNTER — Encounter: Payer: Self-pay | Admitting: Internal Medicine

## 2022-04-03 ENCOUNTER — Ambulatory Visit: Payer: No Typology Code available for payment source | Attending: Internal Medicine | Admitting: Internal Medicine

## 2022-04-03 VITALS — BP 140/82 | HR 73 | Ht 69.0 in | Wt 161.0 lb

## 2022-04-03 DIAGNOSIS — G90A Postural orthostatic tachycardia syndrome (POTS): Secondary | ICD-10-CM | POA: Diagnosis not present

## 2022-04-03 NOTE — Progress Notes (Unsigned)
ELECTROPHYSIOLOGY CONSULT NOTE  Patient ID: Cheryl Lara, MRN: [id number], DOB/AGE: [birth year] 41 y.o. Admit date: (Not on file) Date of Consult: 04/03/2022  Primary Physician: [name], MD Primary Cardiologist: [facility]     Cheryl Lara is a 41 y.o. female who is being seen today for the evaluation of POTS at the request of DrDH.    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  Post viral symptoms included brain fog/poor cognition; Headaches-severe diagnosed as migraines and being treated with Botox.  Also with Orthostatic lightheadedness worse with ambient temperature elevation and showers; significant exercise intolerance manifested by dyspnea and tachypalpitations and lightheadedness.  Resting heart rate pre-COVID was in the 50s now has been in the 80s-90s  Raynaud's cold sensation of her extremities, interestingly, in the summer, she describes them as being warm and puffy. Sphenopalatine block stellate ganglion block   Steroids used orally as well as topically.  She has been somewhat improved by Salt supplementation liquid IV x 2 and compression which she wears calf and thigh. Low histamine diet recommended by her and has led to her most significant improvement  Tried on propranolol for tremor by Dr. Shelby Mattocks; not tolerated.  Diltiazem temporally associated with an ER visit for chest pain.  Remotely,  left leg venous ablation for pain DATE TEST EF   10/22 Echo   60-65 % Mild mitral valve prolapse  7/23 CTA  CaScore  0   Date Cr K Hgb TSH  5/23 0.79 4.3 12.8    8/23     0.66   CPx 7/22 heart rate at rest 96 peak heart rate 165 which did not start to really accelerate until she reached anaerobic threshold--normal CPX without cardiopulmonary limitation the O2 max is only about 25  Event  recorder 03/2235 triggered events, all with sinus rhythm.  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   Dichorionic diamniotic twin pregnancy in third trimester 11/20/2014   GERD (gastroesophageal reflux disease)    HPV (human papilloma virus) infection    Iron deficiency anemia 11/27/2014   Mitral valve prolapse    Postpartum care following cesarean delivery of Di-Di twins (8/6) 11/27/2014   Postural orthostatic tachycardia syndrome (POTS)    Preterm uterine contractions in third trimester, antepartum 11/20/2014   Scarlet fever    hx of      Surgical History:  Past Surgical History:  Procedure Laterality Date   CARDIOPULMONARY EXERCISE TEST (CPX)  02/13/2021   Normal functional capacity.  No cardiopulmonary abnormalities.  No evidence of any exercise-induced bronchospasm   CESAREAN SECTION N/A 11/27/2014   Procedure: CESAREAN SECTION;  Surgeon: Brien Few, MD;  Location: [facility];  Service: Obstetrics;  Laterality: N/A;   COLONOSCOPY     CRYOABLATION     LAPAROSCOPIC NISSEN FUNDOPLICATION  2951   polyp     removal 2009  TRANSTHORACIC ECHOCARDIOGRAM  02/10/2021   EF 60 to 65%.  No R WMA.  GR 1 DD?Marland Kitchen  Normal RV size and function.  Normal aortic valve.  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   TYMPANOSTOMY TUBE PLACEMENT     UPPER GASTROINTESTINAL ENDOSCOPY     WISDOM TOOTH EXTRACTION       Home Meds: Current Meds  Medication Sig   albuterol (VENTOLIN HFA) 108 (90 Base) MCG/ACT inhaler Inhale into the lungs as needed.   fluocinonide (LIDEX) 0.05 % external solution Apply 1 Application  topically 2 (two) times daily.   metroNIDAZOLE (METROCREAM) 0.75 % cream Apply topically.   mometasone (ELOCON) 0.1 % cream Apply topically 2 (two) times daily.   MYDAYIS 50 MG CP24 Take 1 capsule by mouth every morning.   naratriptan (AMERGE) 2.5 MG tablet Take 1 tablet (2.5 mg total) by mouth as needed for migraine. Take one (1) tablet at onset of headache; if returns or does not resolve, may repeat after 4 hours; do not exceed five (5) mg in 24 hours.   pantoprazole (PROTONIX) 40 MG tablet TAKE 1 TABLET BY MOUTH TWICE A DAY   pimecrolimus (ELIDEL) 1 % cream Apply 1 application. topically 2 (two) times daily as needed.   Ubrogepant (UBRELVY) 100 MG TABS Take 100 mg by mouth as needed (for migraine). May repeat a dose in 2 hours if headache persists. Max dose 2 pills in 24 hours    Allergies:  Allergies  Allergen Reactions   Codeine Nausea And Vomiting   Cottonseed Oil Other (See Comments)   Gluten Meal Other (See Comments)    Unknown   Nickel Dermatitis, Itching and Rash    Social History   Socioeconomic History   Marital status: Married    Spouse name: [name]   Number of children: 3   Years of education: Not on file   Highest education level: Not on file  Occupational History   Not on file  Tobacco Use   Smoking status: Never   Smokeless tobacco: Never  Vaping Use   Vaping Use: Never used  Substance and Sexual Activity   Alcohol use: Yes    Comment: occ   Drug use: No   Sexual activity: Yes    Birth control/protection: None  Other Topics Concern   Not on file  Social History Narrative   She is married, mother of 3 children ages 61, 53 and 73.   Social Determinants of Health   Financial Resource Strain: Not on file  Food Insecurity: Not on file  Transportation Needs: Not on file  Physical Activity: Not on file  Stress: Not on file  Social Connections: Not on file  Intimate Partner Violence: Not on file     Family History  Problem Relation Age of Onset    Cancer Mother    CAD Mother    Hypertension Mother    Hypertension Father    CAD Father 67       Followed by Dr. [name]; ~CABG @ age ~60   Cancer Maternal Grandmother    Diabetes Maternal Grandmother    Diabetes type II Maternal Grandmother    Colon cancer Maternal Grandfather    Breast cancer Paternal Grandmother    Stroke Paternal Grandmother    Esophageal cancer Neg Hx    Rectal cancer Neg Hx    Stomach cancer Neg Hx      ROS:  Please see the history of present illness.  All other systems reviewed and negative.    Physical Exam:  Blood pressure (!) 140/82, pulse 73, height '5\' 9"'$  (1.753 m), weight 161 lb (73 kg), SpO2 98 %, unknown if currently breastfeeding. General: Well developed, well nourished female in no acute distress. Head: Normocephalic, atraumatic, sclera non-icteric, no xanthomas, nares are without discharge. EENT: normal  Lymph Nodes:  none Neck: Negative for carotid bruits. JVD not elevated. Back:without scoliosis kyphosis Lungs: Clear bilaterally to auscultation without wheezes, rales, or rhonchi. Breathing is unlabored. Heart: RRR with S1 S2. No  /6 systolic murmur . No rubs, or gallops appreciated. Abdomen: Soft, non-tender, non-distended with normoactive bowel sounds. No hepatomegaly. No rebound/guarding. No obvious abdominal masses. Msk:  Strength and tone appear normal for age. Extremities: No clubbing or cyanosis. No  edema.  Distal pedal pulses are 2+ and equal bilaterally. Skin: Warm and Dry Neuro: Alert and oriented X 3. CN III-XII intact Grossly normal sensory and motor function . Psych:  Responds to questions appropriately with a normal affect.        EKG: Sinus at 73 Vitals 17/10/38   Assessment and Plan:  COVID 7/22  Long COVID symptoms including brain fog; exercise intolerance, headaches, Raynaud's.  Left lower extremity venous ablation   Has symptoms suggestive of dysautonomia in the context of her long COVID although the  orthostatic vital signs measured today or not so clear.  Those measured 5/23 or more notable with a delta heart rate of 40, clearly she has orthostatic lightheadedness. \ We discussed extensively the issues of dysautonomia, the physiology of orthstasis and positional stress.  We discussed the role of salt and water repletion, the importance of exercise, often needing to be started in the recumbent position, and the awareness of triggers and the role of ambient heat and dehydration  Discussed salt substitutes with a  goal of 2-3 gms of Sodium or 5-7 gm of sodium Chloride daily.  Rehydration solutions include Liquid IV, NUUN, TriOral, Normralyte pedialyte advanced care and Banana Bags.  Salt tablets include plain salt tablets, SaltStick Vitassium, thermatabs amongst others.   The higher sodium concentration per serving on this list is normalyte about 800 mg of sodium per serving LMNT1000 mg and most recently of worried about Within You hydration formula also at 1000 mg  Salt supplements are best used with adjunctive sugar  Also discussed the role of compression wear including thigh sleeves and abdominal binders.  Calf compression is not specifically recommended. .  Not sure of why the low histamine diet his work, but and pleasantly surprised that has.  Moreover, she is post to go for stellate ganglion block.  A brief literature review show number of papers were this has been done at various centers with moderate success.     Virl Axe

## 2022-04-03 NOTE — Progress Notes (Unsigned)
[facility]  BOTULINUM TOXIN INJECTION PROCEDURE NOTE  Patient: Cheryl Lara MRN: [id number]  Indication: Chronic migraines   History: 41 year old female with a history of GERD who follows in clinic for post-COVID headache and tremors. She underwent stellate ganglion block on ***  Last injection: 12/28/21 (2nd injection today)  Technique: Informed consent was obtained and signed. 200 units onabotulinumtoxinA (Lot# ***; Expiration: ***) were reconstituted using normal saline, to a concentration of 5 units per 0.57m. 155 units were injected using sterile technique across 31 sites as follows: corrugator 10, procerus 5 units, frontalis 20 units, temporalis 40 units, occipitalis 30 units, cervical paraspinal 20 units, trapezius 30 units. 45 units were wasted. Patient tolerated procedure without complication.

## 2022-04-04 ENCOUNTER — Encounter: Payer: Self-pay | Admitting: Psychiatry

## 2022-04-04 ENCOUNTER — Ambulatory Visit (INDEPENDENT_AMBULATORY_CARE_PROVIDER_SITE_OTHER): Payer: No Typology Code available for payment source | Admitting: Psychiatry

## 2022-04-04 DIAGNOSIS — G43719 Chronic migraine without aura, intractable, without status migrainosus: Secondary | ICD-10-CM | POA: Diagnosis not present

## 2022-04-04 MED ORDER — ONABOTULINUMTOXINA 200 UNITS IJ SOLR
155.0000 [IU] | Freq: Once | INTRAMUSCULAR | Status: AC
  Administered 2022-04-04: 155 [IU] via INTRAMUSCULAR

## 2022-04-04 NOTE — Progress Notes (Signed)
Botox- 200 units x 1 vial Lot: [id number] Expiration: 07/2024 NDC: 2423-5361-44  Bacteriostatic 0.9% Sodium Chloride- 40m total Lot: [id number]: 05/04/2022 NDC: [address]-93 Dx: GO67.124 B/B

## 2022-04-26 ENCOUNTER — Ambulatory Visit: Payer: No Typology Code available for payment source | Admitting: Psychiatry

## 2022-05-31 ENCOUNTER — Ambulatory Visit: Payer: No Typology Code available for payment source | Attending: Cardiology | Admitting: Internal Medicine

## 2022-05-31 ENCOUNTER — Encounter: Payer: Self-pay | Admitting: Internal Medicine

## 2022-05-31 ENCOUNTER — Telehealth: Payer: Self-pay

## 2022-05-31 ENCOUNTER — Telehealth: Payer: Self-pay | Admitting: Psychiatry

## 2022-05-31 VITALS — Ht 69.0 in

## 2022-05-31 DIAGNOSIS — U099 Post covid-19 condition, unspecified: Secondary | ICD-10-CM | POA: Diagnosis not present

## 2022-05-31 DIAGNOSIS — R0609 Other forms of dyspnea: Secondary | ICD-10-CM

## 2022-05-31 DIAGNOSIS — G901 Familial dysautonomia [Riley-Day]: Secondary | ICD-10-CM

## 2022-05-31 DIAGNOSIS — G43719 Chronic migraine without aura, intractable, without status migrainosus: Secondary | ICD-10-CM

## 2022-05-31 NOTE — Patient Instructions (Signed)
Medication Instructions:  Your physician recommends that you continue on your current medications as directed. Please refer to the Current Medication list given to you today.  *If you need a refill on your cardiac medications before your next appointment, please call your pharmacy*   Lab Work: None ordered.  If you have labs (blood work) drawn today and your tests are completely normal, you will receive your results only by: [place] (if you have MyChart) OR A paper copy in the mail If you have any lab test that is abnormal or we need to change your treatment, we will call you to review the results.   Testing/Procedures: None ordered.    Follow-Up: At [facility], you and your health needs are our priority.  As part of our continuing mission to provide you with exceptional heart care, we have created designated Provider Care Teams.  These Care Teams include your primary Cardiologist (physician) and Advanced Practice Providers (APPs -  Physician Assistants and Nurse Practitioners) who all work together to provide you with the care you need, when you need it.  We recommend signing up for the patient portal called "MyChart".  Sign up information is provided on this After Visit Summary.  MyChart is used to connect with patients for Virtual Visits (Telemedicine).  Patients are able to view lab/test results, encounter notes, upcoming appointments, etc.  Non-urgent messages can be sent to your provider as well.   To learn more about what you can do with MyChart, go to [email or web address].    Your next appointment:   Follow up with Dr Caryl Comes as needed

## 2022-05-31 NOTE — Telephone Encounter (Signed)
..   Pt understands that although there may be some limitations with this type of visit, we will take all precautions to reduce any security or privacy concerns.  Pt understands that this will be treated like an in office visit and we will file with pt's insurance, and there may be a patient responsible charge related to this service. ? ?

## 2022-05-31 NOTE — Progress Notes (Signed)
Electrophysiology TeleHealth Note      Date:  05/31/2022   ID:  Cheryl Lara, DOB 01-22-1981, MRN [id number]  Location: patient's home  Provider location: [address] [place]  Evaluation Performed: Follow-up visit  PCP:  [name], MD  Cardiologist:     Electrophysiologist:  SK   Chief Complaint:  long COVID  History of Present Illness:    Cheryl Lara is a 42 y.o. female who presents via audio/video conferencing for a telehealth visit today.  Since last being seen in our clinic for dysautonomia  the patient reports    Stellate ganglion block >> L sided improvement HR/BP  R sided >> "horners syndrome" >>>> vast improvement in HR /BP  >>>>orthostatic stress marked improvement delta 10-15 bpm Repeat block>>scheduled next week With improvement in exercise//and headaches  The patient denies symptoms of fevers, chills, cough, or new SOB worrisome for COVID 19.    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   Dichorionic diamniotic twin pregnancy in third trimester 11/20/2014   GERD (gastroesophageal reflux disease)    HPV (human papilloma virus) infection    Iron deficiency anemia 11/27/2014   Mitral valve prolapse    Postpartum care following cesarean delivery of Di-Di twins (8/6) 11/27/2014   Postural orthostatic tachycardia syndrome (POTS)    Preterm uterine contractions in third trimester, antepartum 11/20/2014   Scarlet fever    hx of    Past Surgical History:  Procedure Laterality Date   CARDIOPULMONARY EXERCISE TEST (CPX)  02/13/2021   Normal functional capacity.  No cardiopulmonary abnormalities.  No evidence of any exercise-induced bronchospasm   CESAREAN SECTION N/A 11/27/2014   Procedure:  CESAREAN SECTION;  Surgeon: Brien Few, MD;  Location: [facility];  Service: Obstetrics;  Laterality: N/A;   COLONOSCOPY     CRYOABLATION     LAPAROSCOPIC NISSEN FUNDOPLICATION  1448   polyp     removal 2009   Sphenopalatine Ganglion Block  03/28/2022   TRANSTHORACIC ECHOCARDIOGRAM  02/10/2021   EF 60 to 65%.  No R WMA.  GR 1 DD?Marland Kitchen  Normal RV size and function.  Normal aortic valve.  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   TYMPANOSTOMY TUBE PLACEMENT     UPPER GASTROINTESTINAL ENDOSCOPY     WISDOM TOOTH EXTRACTION      Current Outpatient Medications  Medication Sig Dispense Refill   albuterol (VENTOLIN HFA) 108 (90 Base) MCG/ACT inhaler Inhale into the lungs as needed.     fluocinonide (LIDEX) 0.05 % external solution Apply 1 Application topically 2 (two) times daily.     methocarbamol (ROBAXIN) 500 MG tablet Take 1,000 mg by mouth as needed.     metroNIDAZOLE (METROCREAM) 0.75 % cream Apply topically.     mometasone (ELOCON) 0.1 % cream Apply topically 2 (two) times daily.     MYDAYIS 50 MG CP24 Take 1 capsule by mouth every morning.     naratriptan (AMERGE) 2.5 MG tablet Take 1 tablet (2.5 mg total) by mouth as needed for migraine. Take one (1) tablet at onset of headache; if returns or does not resolve, may repeat after 4 hours; do not exceed five (5) mg in  24 hours. 10 tablet 6   pantoprazole (PROTONIX) 40 MG tablet TAKE 1 TABLET BY MOUTH TWICE A DAY 180 tablet 0   pimecrolimus (ELIDEL) 1 % cream Apply 1 application. topically 2 (two) times daily as needed.     Ubrogepant (UBRELVY) 100 MG TABS Take 100 mg by mouth as needed (for migraine). May repeat a dose in 2 hours if headache persists. Max dose 2 pills in 24 hours 16 tablet 6   No current facility-administered medications for this visit.    Allergies:   Codeine, Cottonseed oil, Gluten meal, and Nickel      ROS:  Please see the history of present illness.   All other systems are personally reviewed and  negative.    Exam:    Vital Signs:  Ht '5\' 9"'$  (1.753 m)   BMI 23.78 kg/m         Labs/Other Tests and Data Reviewed:    Recent Labs: 08/31/2021: BUN 12; Creatinine, Ser 0.79; Hemoglobin 12.8; Platelets 252; Potassium 4.3; Sodium 141 12/07/2021: TSH 0.658   Wt Readings from Last 3 Encounters:  04/03/22 161 lb (73 kg)  12/06/21 160 lb 6.4 oz (72.8 kg)  11/15/21 160 lb 6.4 oz (72.8 kg)     Other studies personally reviewed: Additional studies/ records that were reviewed today include:     ASSESSMENT & PLAN:    COVID 7/22   Long COVID symptoms including brain fog; exercise intolerance, headaches, Raynaud's.   Left lower extremity venous ablation   Stellate ganglion blockade ( [facility])   Feels so much better      Follow-up:  prn     Current medicines are reviewed at length with the patient today.   The patient does not have concerns regarding her medicines.  The following changes were made today:  none  Labs/ tests ordered today include:   No orders of the defined types were placed in this encounter.     Today, I have spent 13 minutes with the patient with telehealth technology discussing the above.  Signed, Virl Axe, MD  05/31/2022 4:36 PM     [address] [place] [address] [phone] (office) [phone] (fax)

## 2022-05-31 NOTE — Telephone Encounter (Signed)
Could you please check if pt's Botox should be B/B or SP? She's scheduled for 06/28/22.

## 2022-06-14 ENCOUNTER — Other Ambulatory Visit (HOSPITAL_COMMUNITY): Payer: Self-pay

## 2022-06-14 NOTE — Telephone Encounter (Signed)
  Benefit Verification [id number] Submitted!

## 2022-06-18 ENCOUNTER — Other Ambulatory Visit: Payer: Self-pay

## 2022-06-18 ENCOUNTER — Other Ambulatory Visit (HOSPITAL_COMMUNITY): Payer: Self-pay

## 2022-06-18 MED ORDER — ONABOTULINUMTOXINA 200 UNITS IJ SOLR
INTRAMUSCULAR | 2 refills | Status: DC
  Filled 2022-06-18: qty 1, fill #0
  Filled 2022-06-25 – 2022-07-04 (×2): qty 1, 84d supply, fill #0
  Filled 2022-10-22: qty 1, 84d supply, fill #1
  Filled 2023-01-09: qty 1, 84d supply, fill #2

## 2022-06-18 NOTE — Telephone Encounter (Signed)
Pharmacy Patient Advocate Encounter- Botox BIV-Pharmacy Benefit:  PA was submitted to [employer] and has been approved through: 06/14/2023 Authorization# PA Case ID #: [id number]  Please send prescription to Specialty Pharmacy: [facility]: [phone]  Estimated Copay is: [id number]  Patient Is eligible for Botox Copay Card, which will make patient's copay as little as zero. Copay card will be provided to pharmacy.

## 2022-06-18 NOTE — Addendum Note (Signed)
Addended by: Wyvonnia Lora on: 06/18/2022 08:38 AM   Modules accepted: Orders

## 2022-06-18 NOTE — Telephone Encounter (Signed)
Please send Rx to [facility].

## 2022-06-20 ENCOUNTER — Other Ambulatory Visit: Payer: Self-pay

## 2022-06-21 ENCOUNTER — Ambulatory Visit: Payer: No Typology Code available for payment source | Admitting: Psychiatry

## 2022-06-25 ENCOUNTER — Other Ambulatory Visit (HOSPITAL_COMMUNITY): Payer: Self-pay

## 2022-06-25 ENCOUNTER — Other Ambulatory Visit: Payer: Self-pay

## 2022-06-28 ENCOUNTER — Ambulatory Visit: Payer: No Typology Code available for payment source | Admitting: Psychiatry

## 2022-06-28 ENCOUNTER — Encounter: Payer: Self-pay | Admitting: Psychiatry

## 2022-06-28 NOTE — Progress Notes (Deleted)
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   Last injection: 04/04/22 (3rd injection today)  Technique: Informed consent was obtained and signed. 200 units onabotulinumtoxinA (Lot# ***; Expiration: ***) were reconstituted using normal saline, to a concentration of 5 units per 0.71m. 155 units were injected using sterile technique across 31 sites as follows: corrugator 10, procerus 5 units, frontalis 20 units, temporalis 40 units, occipitalis 30 units, cervical paraspinal 20 units, trapezius 30 units. 45 units were wasted. Patient tolerated procedure without complication.  Prior Therapies                                  Rescue: Robaxin  Celebrex Relpax - side effects Naratriptan 2.5 mg PRN - drowsiness Ubrelvy - helpful Nurtec   Preventive: Propranolol 5 mg BID - drowsiness Topamax 25 mg QHS - brain fog, fatigue Botox  Plan: -Rescue: Continue Ubrelvy 100 mg PRN -Return for Botox in 3 months  JGenia Harold3/7/24

## 2022-07-02 ENCOUNTER — Other Ambulatory Visit (HOSPITAL_COMMUNITY): Payer: Self-pay

## 2022-07-04 ENCOUNTER — Other Ambulatory Visit: Payer: Self-pay

## 2022-07-04 ENCOUNTER — Other Ambulatory Visit (HOSPITAL_COMMUNITY): Payer: Self-pay

## 2022-07-05 ENCOUNTER — Other Ambulatory Visit (HOSPITAL_COMMUNITY): Payer: Self-pay

## 2022-07-09 ENCOUNTER — Telehealth: Payer: Self-pay | Admitting: Psychiatry

## 2022-07-09 DIAGNOSIS — G43719 Chronic migraine without aura, intractable, without status migrainosus: Secondary | ICD-10-CM

## 2022-07-09 DIAGNOSIS — Z0289 Encounter for other administrative examinations: Secondary | ICD-10-CM

## 2022-07-09 NOTE — Telephone Encounter (Signed)
Noted  

## 2022-07-09 NOTE — Telephone Encounter (Signed)
FYI pt has called and r/s her botox appointment and is on wait list

## 2022-08-08 NOTE — Progress Notes (Signed)
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 Headaches have improved significantly with Botox. She is a month overdue for Botox today and has noticed her headaches start to return this month.  Last injection: 04/04/22  Technique: Informed consent was obtained and signed. 200 units onabotulinumtoxinA (Lot# [id number]; Expiration: 09/2024) were reconstituted using normal saline, to a concentration of 5 units per 0.29ml. 155 units were injected using sterile technique across 31 sites as follows: corrugator 10, procerus 5 units, frontalis 20 units, temporalis 40 units, occipitalis 30 units, cervical paraspinal 20 units, trapezius 30 units. 45 units were wasted. Patient tolerated procedure without complication.  Prior Therapies                                  Rescue: Robaxin  Celebrex Relpax - side effects Naratriptan 2.5 mg PRN - drowsiness Ubrelvy - helpful Nurtec   Preventive: Propranolol 5 mg BID - drowsiness Topamax 25 mg QHS - brain fog, fatigue Botox  Plan: -Rescue: Continue Ubrelvy 100 mg PRN -Return for Botox in 3 months  Ocie Doyne 08/09/22 2:55 PM

## 2022-08-09 ENCOUNTER — Ambulatory Visit (INDEPENDENT_AMBULATORY_CARE_PROVIDER_SITE_OTHER): Payer: No Typology Code available for payment source | Admitting: Psychiatry

## 2022-08-09 DIAGNOSIS — G43719 Chronic migraine without aura, intractable, without status migrainosus: Secondary | ICD-10-CM | POA: Diagnosis not present

## 2022-08-09 MED ORDER — UBRELVY 100 MG PO TABS: 100.0000 mg | ORAL_TABLET | ORAL | 6 refills | Status: DC | PRN

## 2022-08-09 MED ORDER — ONABOTULINUMTOXINA 200 UNITS IJ SOLR
155.0000 [IU] | Freq: Once | INTRAMUSCULAR | Status: AC
  Administered 2022-08-09: 155 [IU] via INTRAMUSCULAR

## 2022-08-09 NOTE — Progress Notes (Signed)
Botox- 200 units x 1 vial Lot: [id number] Expiration:09/2024 NDC: 0023-3921-02  Bacteriostatic 0.9% Sodium Chloride- 4 mL total Lot: [id number] Expiration:11/25 NDC: 64403-474-25  Dx:G43.719 S/P Witnessed ZD:[id number] Martin,RN

## 2022-09-07 ENCOUNTER — Other Ambulatory Visit (HOSPITAL_COMMUNITY): Payer: Self-pay

## 2022-09-13 ENCOUNTER — Other Ambulatory Visit (HOSPITAL_COMMUNITY): Payer: Self-pay

## 2022-10-03 ENCOUNTER — Ambulatory Visit: Payer: No Typology Code available for payment source | Admitting: Family Medicine

## 2022-10-09 ENCOUNTER — Encounter: Payer: Self-pay | Admitting: Cardiology

## 2022-10-09 NOTE — Telephone Encounter (Signed)
No need for antibiotic prophylaxis.

## 2022-10-11 ENCOUNTER — Other Ambulatory Visit (HOSPITAL_COMMUNITY): Payer: Self-pay

## 2022-10-11 ENCOUNTER — Telehealth: Payer: Self-pay

## 2022-10-11 NOTE — Telephone Encounter (Signed)
Pharmacy Patient Advocate Encounter  Prior Authorization for Bernita Raisin 100MG  tablets has been APPROVED by [facility] from 10/11/2022 to 10/11/2023.   PA # PA Case ID: [id number]  Copay is $0 per 30ds per [facility] test claim.

## 2022-10-17 ENCOUNTER — Other Ambulatory Visit (HOSPITAL_COMMUNITY): Payer: Self-pay

## 2022-10-22 ENCOUNTER — Other Ambulatory Visit (HOSPITAL_COMMUNITY): Payer: Self-pay

## 2022-10-22 ENCOUNTER — Other Ambulatory Visit: Payer: Self-pay

## 2022-10-26 ENCOUNTER — Other Ambulatory Visit (HOSPITAL_COMMUNITY): Payer: Self-pay

## 2022-11-01 ENCOUNTER — Ambulatory Visit (INDEPENDENT_AMBULATORY_CARE_PROVIDER_SITE_OTHER): Payer: No Typology Code available for payment source | Admitting: Adult Health

## 2022-11-01 ENCOUNTER — Encounter: Payer: Self-pay | Admitting: Adult Health

## 2022-11-01 DIAGNOSIS — G43719 Chronic migraine without aura, intractable, without status migrainosus: Secondary | ICD-10-CM

## 2022-11-01 DIAGNOSIS — R29818 Other symptoms and signs involving the nervous system: Secondary | ICD-10-CM

## 2022-11-01 MED ORDER — ONABOTULINUMTOXINA 200 UNITS IJ SOLR
155.0000 [IU] | Freq: Once | INTRAMUSCULAR | Status: AC
  Administered 2022-11-01: 155 [IU] via INTRAMUSCULAR

## 2022-11-01 NOTE — Progress Notes (Signed)
Returns for botox injection. Reports great control of migraines with botox for the first 2 months but then will start wearing off and can experience more migraines. Will use Ubrelvy or naratriptan only with severe headaches that cause nausea or interfere with sleep.  She does complain of jaw pain with clenching and grinding of teeth which can contribute to headaches.  She is currently being followed by dentist with concern for possible sleep apnea and recommended being seen for a sleep evaluation.  Referral will be placed for sleep evaluation.     Consent Form Botulism Toxin Injection For Chronic Migraine    Reviewed orally with patient, additionally signature is on file:  Botulism toxin has been approved by the Federal drug administration for treatment of chronic migraine. Botulism toxin does not cure chronic migraine and it may not be effective in some patients.  The administration of botulism toxin is accomplished by injecting a small amount of toxin into the muscles of the neck and head. Dosage must be titrated for each individual. Any benefits resulting from botulism toxin tend to wear off after 3 months with a repeat injection required if benefit is to be maintained. Injections are usually done every 3-4 months with maximum effect peak achieved by about 2 or 3 weeks. Botulism toxin is expensive and you should be sure of what costs you will incur resulting from the injection.  The side effects of botulism toxin use for chronic migraine may include:   -Transient, and usually mild, facial weakness with facial injections  -Transient, and usually mild, head or neck weakness with head/neck injections  -Reduction or loss of forehead facial animation due to forehead muscle weakness  -Eyelid drooping  -Dry eye  -Pain at the site of injection or bruising at the site of injection  -Double vision  -Potential unknown long term risks   Contraindications: You should not have Botox if you  are pregnant, nursing, allergic to albumin, have an infection, skin condition, or muscle weakness at the site of the injection, or have myasthenia gravis, Lambert-Eaton syndrome, or ALS.  It is also possible that as with any injection, there may be an allergic reaction or no effect from the medication. Reduced effectiveness after repeated injections is sometimes seen and rarely infection at the injection site may occur. All care will be taken to prevent these side effects. If therapy is given over a long time, atrophy and wasting in the muscle injected may occur. Occasionally the patient's become refractory to treatment because they develop antibodies to the toxin. In this event, therapy needs to be modified.  I have read the above information and consent to the administration of botulism toxin.    BOTOX PROCEDURE NOTE FOR MIGRAINE HEADACHE  Contraindications and precautions discussed with patient(above). Aseptic procedure was observed and patient tolerated procedure. Procedure performed by Ihor Austin, AGNP-BC.   The condition has existed for more than 6 months, and pt does not have a diagnosis of ALS, Myasthenia Gravis or Lambert-Eaton Syndrome.  Risks and benefits of injections discussed and pt agrees to proceed with the procedure.  Written consent obtained  These injections are medically necessary. Pt  receives good benefits from these injections. These injections do not cause sedations or hallucinations which the oral therapies may cause.   Description of procedure:  The patient was placed in a sitting position. The standard protocol was used for Botox as follows, with 5 units of Botox injected at each site:  -Procerus muscle, midline  injection  -Corrugator muscle, bilateral injection  -Frontalis muscle, bilateral injection, with 2 sites each side, medial injection was performed in the upper one third of the frontalis muscle, in the region vertical from the medial inferior edge of the  superior orbital rim. The lateral injection was again in the upper one third of the forehead vertically above the lateral limbus of the cornea, 1.5 cm lateral to the medial injection site.  -Temporalis muscle injection, 4 sites, bilaterally. The first injection was 3 cm above the tragus of the ear, second injection site was 1.5 cm to 3 cm up from the first injection site in line with the tragus of the ear. The third injection site was 1.5-3 cm forward between the first 2 injection sites. The fourth injection site was 1.5 cm posterior to the second injection site. 5th site laterally in the temporalis  muscleat the level of the outer canthus.  -Occipitalis muscle injection, 3 sites, bilaterally. The first injection was done one half way between the occipital protuberance and the tip of the mastoid process behind the ear. The second injection site was done lateral and superior to the first, 1 fingerbreadth from the first injection. The third injection site was 1 fingerbreadth superiorly and medially from the first injection site.  -Cervical paraspinal muscle injection, 2 sites, bilaterally. The first injection site was 1 cm from the midline of the cervical spine, 3 cm inferior to the lower border of the occipital protuberance. The second injection site was 1.5 cm superiorly and laterally to the first injection site.  -Trapezius muscle injection was performed at 3 sites, bilaterally. The first injection site was in the upper trapezius muscle halfway between the inflection point of the neck, and the acromion. The second injection site was one half way between the acromion and the first injection site. The third injection was done between the first injection site and the inflection point of the neck.    A total of 200 units of Botox was prepared, 155 units of Botox was injected as documented above, any Botox not injected was wasted. The patient tolerated the procedure well, there were no complications of the  above procedure.   Ihor Austin, AGNP-BC  [facility] [address] [place], [place] [address]  Phone [phone] Fax [phone] Note: This document was prepared with digital dictation and possible smart phrase technology. Any transcriptional errors that result from this process are unintentional.

## 2022-11-01 NOTE — Progress Notes (Signed)
Botox- 200 units x 1 vial Lot: [id number] Expiration: 11/2024 NDC: 4540-9811-91  Bacteriostatic 0.9% Sodium Chloride- * mL  Lot: [id number] Expiration: 11/30/2022 NDC: 6213-0865-78  Dx: I69.629 S/P  Witnessed by Elane Fritz

## 2022-12-18 ENCOUNTER — Encounter: Payer: Self-pay | Admitting: Neurology

## 2022-12-18 ENCOUNTER — Ambulatory Visit (INDEPENDENT_AMBULATORY_CARE_PROVIDER_SITE_OTHER): Payer: No Typology Code available for payment source | Admitting: Neurology

## 2022-12-18 VITALS — BP 126/67 | HR 73 | Ht 69.0 in | Wt 173.0 lb

## 2022-12-18 DIAGNOSIS — G4719 Other hypersomnia: Secondary | ICD-10-CM

## 2022-12-18 DIAGNOSIS — Z9189 Other specified personal risk factors, not elsewhere classified: Secondary | ICD-10-CM | POA: Diagnosis not present

## 2022-12-18 DIAGNOSIS — Z8616 Personal history of COVID-19: Secondary | ICD-10-CM

## 2022-12-18 DIAGNOSIS — Z820 Family history of epilepsy and other diseases of the nervous system: Secondary | ICD-10-CM | POA: Diagnosis not present

## 2022-12-18 DIAGNOSIS — R519 Headache, unspecified: Secondary | ICD-10-CM

## 2022-12-18 DIAGNOSIS — R5383 Other fatigue: Secondary | ICD-10-CM

## 2022-12-18 DIAGNOSIS — R002 Palpitations: Secondary | ICD-10-CM | POA: Diagnosis not present

## 2022-12-18 DIAGNOSIS — Q2112 Patent foramen ovale: Secondary | ICD-10-CM

## 2022-12-18 DIAGNOSIS — G90A Postural orthostatic tachycardia syndrome (POTS): Secondary | ICD-10-CM

## 2022-12-18 DIAGNOSIS — R351 Nocturia: Secondary | ICD-10-CM

## 2022-12-18 DIAGNOSIS — I341 Nonrheumatic mitral (valve) prolapse: Secondary | ICD-10-CM

## 2022-12-18 DIAGNOSIS — G901 Familial dysautonomia [Riley-Day]: Secondary | ICD-10-CM

## 2022-12-18 DIAGNOSIS — R000 Tachycardia, unspecified: Secondary | ICD-10-CM

## 2022-12-18 NOTE — Patient Instructions (Signed)

## 2022-12-18 NOTE — Progress Notes (Signed)
Subjective:    Patient ID: NYOBI HERRIAGE, female    DOB: 12-Jul-1980, 42 y.o.   MRN: [id number]  HPI    Cheryl Foley, MD, PhD [facility] [address], [address] [address] [place], [place] [address]  Dear Cheryl Lara,    I saw your patient, Cheryl Lara, upon your kind request in my sleep clinic today for initial consultation of her disorder, in particular, concern for underlying obstructive sleep apnea.  The patient is unaccompanied today.  As you know, Cheryl Lara is a 42 year old female with an underlying medical history of anemia, mitral valve prolapse, POTS, dysautonomia, history of COVID, ADD, asthma, migraine headaches, reflux disease and borderline overweight state, who reports snoring and excessive daytime somnolence as well as morning headaches.  Her Epworth sleepiness score is 7 out of 24, fatigue severity score is 54 out of 63.  She reports a family history of sleep apnea, mom has a diagnosis of sleep apnea and is being treated with inspire.  Her dad has sleep apnea.  Patient reports nocturia about twice per average night.  She has had occasional morning headaches.  Botox injections have been helpful for her headaches.  She went to her dentist recently to talk about bruxism and getting a bite guard, they also talked about potentially using an oral appliance for sleep apnea.  She limits her caffeine to 1 cup of coffee in the midmorning time, she drinks alcohol occasionally, she is a non-smoker.  She lives with her husband and 3 children ages 64 and 3-year-old twins.  She works as a Designer, jewellery.  They have 1 dog in the household.  They have a TV in the bedroom but it is not typically on at night.  Bedtime is generally between 8:30 PM and 9:30 PM and rise time between 4:30 AM and 6:30 AM.  She has tried melatonin at night for sleep due to some difficulty falling asleep but she had side effects from it.  She tried trazodone at night for sleep in the past but is no longer  on it.  She has had perimenopausal symptoms.  Her tachycardia improved after she had stellate ganglion block.  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   Dichorionic diamniotic twin pregnancy in third trimester 11/20/2014   GERD (gastroesophageal reflux disease)    HPV (human papilloma virus) infection    Iron deficiency anemia 11/27/2014   Mitral valve prolapse    Postpartum care following cesarean delivery of Di-Di twins (8/6) 11/27/2014   Postural orthostatic tachycardia syndrome (POTS)    Preterm uterine contractions in third trimester, antepartum 11/20/2014   Scarlet fever    hx of    Her Past Surgical History Is Significant For: Past Surgical History:  Procedure Laterality Date   CARDIOPULMONARY EXERCISE TEST (CPX)  02/13/2021   Normal functional capacity.  No cardiopulmonary abnormalities.  No evidence of any exercise-induced bronchospasm   CESAREAN SECTION N/A 11/27/2014   Procedure: CESAREAN SECTION;  Surgeon: Olivia Mackie, MD;  Location: [facility];  Service: Obstetrics;  Laterality: N/A;   COLONOSCOPY     CRYOABLATION     LAPAROSCOPIC NISSEN FUNDOPLICATION  2004   polyp  removal 2009   Sphenopalatine Ganglion Block  03/28/2022   TRANSTHORACIC ECHOCARDIOGRAM  02/10/2021   EF 60 to 65%.  No R WMA.  GR 1 DD?Marland Kitchen  Normal RV size and function.  Normal aortic valve.  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   TYMPANOSTOMY TUBE PLACEMENT     UPPER GASTROINTESTINAL ENDOSCOPY     WISDOM TOOTH EXTRACTION      Her Family History Is Significant For: Family History  Problem Relation Age of Onset   Cancer Mother    CAD Mother    Hypertension Mother     Hypertension Father    CAD Father 12       Followed by Dr. Herbie Baltimore; ~CABG @ age ~48   Cancer Maternal Grandmother    Diabetes Maternal Grandmother    Diabetes type II Maternal Grandmother    Colon cancer Maternal Grandfather    Breast cancer Paternal Grandmother    Stroke Paternal Grandmother    Esophageal cancer Neg Hx    Rectal cancer Neg Hx    Stomach cancer Neg Hx     Her Social History Is Significant For: Social History   Socioeconomic History   Marital status: Married    Spouse name: Jill Alexanders   Number of children: 3   Years of education: Not on file   Highest education level: Not on file  Occupational History   Not on file  Tobacco Use   Smoking status: Never   Smokeless tobacco: Never  Vaping Use   Vaping status: Never Used  Substance and Sexual Activity   Alcohol use: Yes    Comment: occ   Drug use: No   Sexual activity: Yes    Birth control/protection: None  Other Topics Concern   Not on file  Social History Narrative   She is married, mother of 3 children ages 47, 66 and 54.   Social Determinants of Health   Financial Resource Strain: Not on file  Food Insecurity: Not on file  Transportation Needs: Not on file  Physical Activity: Not on file  Stress: Not on file  Social Connections: Not on file    Her Allergies Are:  Allergies  Allergen Reactions   Codeine Nausea And Vomiting   Cottonseed Oil Other (See Comments)   Gluten Meal Other (See Comments)    Unknown   Nickel Dermatitis, Itching and Rash  :   Her Current Medications Are:  Outpatient Encounter Medications as of 12/18/2022  Medication Sig   albuterol (VENTOLIN HFA) 108 (90 Base) MCG/ACT inhaler Inhale into the lungs as needed.   botulinum toxin Type A (BOTOX) 200 units injection Provider to inject 155 units into the muscles of the head and neck every 3 months. Discard remainder   methocarbamol (ROBAXIN) 500 MG tablet Take 1,000 mg by mouth as needed.   metroNIDAZOLE (METROCREAM) 0.75 %  cream Apply topically.   mometasone (ELOCON) 0.1 % cream Apply 1 Application topically daily.   MYDAYIS 50 MG CP24 Take 1 capsule by mouth every morning.   naratriptan (AMERGE) 2.5 MG tablet Take 1 tablet (2.5 mg total) by mouth as needed for migraine. Take one (1) tablet at onset of headache; if returns or does not resolve, may repeat after 4 hours; do not exceed five (5) mg in 24 hours.   pantoprazole (PROTONIX) 40 MG tablet TAKE 1 TABLET BY MOUTH TWICE A DAY   Ubrogepant (UBRELVY) 100 MG TABS Take 1 tablet (100 mg total) by mouth as  needed (for migraine). May repeat a dose in 2 hours if headache persists. Max dose 2 pills in 24 hours   [DISCONTINUED] mometasone (ELOCON) 0.1 % cream Apply topically 2 (two) times daily.   No facility-administered encounter medications on file as of 12/18/2022.  :   Review of Systems:  Out of a complete 14 point review of systems, all are reviewed and negative with the exception of these symptoms as listed below:   Review of Systems  Neurological:        Rm 5. NP / Internal- [facility] / dental recommendation to see sleep specialist, headaches - No prior SS. She c/o morning exhaustion.       Objective:   Physical Exam   Physical Examination:   Vitals:   12/18/22 1120  BP: 126/67  Pulse: 73    General Examination: The patient is a very pleasant 42 y.o. female in no acute distress. She appears well-developed and well-nourished and well groomed.   HEENT: Normocephalic, atraumatic, pupils are equal, round and reactive to light, extraocular tracking is good without limitation to gaze excursion or nystagmus noted. Hearing is grossly intact. Face is symmetric with normal facial animation. Speech is clear with no dysarthria noted. There is no hypophonia. There is no lip, neck/head, jaw or voice tremor. Neck is supple with full range of passive and active motion. There are no carotid bruits on auscultation. Oropharynx exam reveals: No significant mouth  fullness, good dental hygiene, no significant airway crowding, small airway entry but otherwise fairly benign findings, Mallampati class I, smaller tonsils noted.  Neck circumference 14-1/2 inches.  Minimal overbite noted.  Tongue protrudes centrally and palate elevates symmetrically.   Chest: Clear to auscultation without wheezing, rhonchi or crackles noted.  Heart: S1+S2+0, regular and normal without murmurs, rubs or gallops noted.   Abdomen: Soft, non-tender and non-distended.  Extremities: There is no obvious swelling in the distal lower extremities bilaterally.   Skin: Warm and dry without trophic changes noted.   Musculoskeletal: exam reveals no obvious joint deformities.   Neurologically:  Mental status: The patient is awake, alert and oriented in all 4 spheres. Her immediate and remote memory, attention, language skills and fund of knowledge are appropriate. There is no evidence of aphasia, agnosia, apraxia or anomia. Speech is clear with normal prosody and enunciation. Thought process is linear. Mood is normal and affect is normal.  Cranial nerves II - XII are as described above under HEENT exam.  Motor exam: Normal bulk, moving all 4 extremities without limitation. There is no obvious action or resting tremor.  Fine motor skills and coordination: grossly intact.  Cerebellar testing: No dysmetria or intention tremor. There is no truncal or gait ataxia.  Sensory exam: intact to light touch in the upper and lower extremities.  Gait, station and balance: She stands easily. No veering to one side is noted. No leaning to one side is noted. Posture is age-appropriate and stance is narrow based. Gait shows normal stride length and normal pace. No problems turning are noted.     Assessment & Plan:  In summary, MANAL OHMAN is a very pleasant 42 y.o.-year old  with an underlying medical history of anemia, mitral valve prolapse, POTS, dysautonomia, history of COVID, ADD, asthma, migraine  headaches, reflux disease and borderline overweight state, whose history and physical exam are concerning for sleep disordered breathing, particularly obstructive sleep apnea (OSA). A laboratory attended sleep study is typically considered "gold standard" for evaluation of sleep disordered breathing.  I had a long chat with the patient about my findings and the diagnosis of sleep apnea, particularly OSA, its prognosis and treatment options. We talked about medical/conservative treatments, surgical interventions and non-pharmacological approaches for symptom control. I explained, in particular, the risks and ramifications of untreated moderate to severe OSA, especially with respect to developing cardiovascular disease down the road, including congestive heart failure (CHF), difficult to treat hypertension, cardiac arrhythmias (particularly A-fib), neurovascular complications including TIA, stroke and dementia. Even type 2 diabetes has, in part, been linked to untreated OSA. Symptoms of untreated OSA may include (but may not be limited to) daytime sleepiness, nocturia (i.e. frequent nighttime urination), memory problems, mood irritability and suboptimally controlled or worsening mood disorder such as depression and/or anxiety, lack of energy, lack of motivation, physical discomfort, as well as recurrent headaches, especially morning or nocturnal headaches. We talked about the importance of maintaining a healthy lifestyle and striving for healthy weight. In addition, we talked about the importance of striving for and maintaining good sleep hygiene. I recommended a sleep study at this time. I outlined the differences between a laboratory attended sleep study which is considered more comprehensive and accurate over the option of a home sleep test (HST); the latter may lead to underestimation of sleep disordered breathing in some instances and does not help with diagnosing upper airway resistance syndrome and is not  accurate enough to diagnose primary central sleep apnea typically. I outlined possible surgical and non-surgical treatment options of OSA, including the use of a positive airway pressure (PAP) device (i.e. CPAP, AutoPAP/APAP or BiPAP in certain circumstances), a custom-made dental device (aka oral appliance, which would require a referral to a specialist dentist or orthodontist typically, and is generally speaking not considered for patients with full dentures or edentulous state), upper airway surgical options, such as traditional UPPP (which is not considered a first-line treatment) or the Inspire device (hypoglossal nerve stimulator, which would involve a referral for consultation with an ENT surgeon, after careful selection, following inclusion criteria - also not first-line treatment). I explained the PAP treatment option to the patient in detail, as this is generally considered first-line treatment.  The patient indicated that she would be willing to try PAP therapy, if the need arises. I explained the importance of being compliant with PAP treatment, not only for insurance purposes but primarily to improve patient's symptoms symptoms, and for the patient's long term health benefit, including to reduce Her cardiovascular risks longer-term.    We will pick up our discussion about the next steps and treatment options after testing.  We will keep her posted as to the test results by phone call and/or MyChart messaging where possible.  We will plan to follow-up in sleep clinic accordingly as well.  I answered all her questions today and the patient was in agreement.   I encouraged her to call with any interim questions, concerns, problems or updates or email [place] through MyChart.  Generally speaking, sleep test authorizations may take up to 2 weeks, sometimes less, sometimes longer, the patient is encouraged to get in touch with [place] if they do not hear back from the sleep lab staff directly within the next 2  weeks.  Thank you very much for allowing me to participate in the care of this nice patient. If I can be of any further assistance to you please do not hesitate to call me at [phone].  Sincerely,   Cheryl Foley, MD, PhD

## 2022-12-20 ENCOUNTER — Telehealth: Payer: Self-pay | Admitting: Neurology

## 2022-12-20 NOTE — Telephone Encounter (Signed)
NPSG- [facility] GEHA pending faxed notes.  HST- [employer] GE[facility] no auth req ref # [id number] C on 12/20/22.

## 2022-12-25 NOTE — Telephone Encounter (Signed)
Checked status on the portal it is pending.

## 2023-01-03 NOTE — Telephone Encounter (Signed)
[employer] GEHA denied the NPSG see below for the reason.  HST- [employer] GE[facility] No auth req.Marland Kitchen

## 2023-01-09 ENCOUNTER — Other Ambulatory Visit (HOSPITAL_COMMUNITY): Payer: Self-pay

## 2023-01-22 ENCOUNTER — Ambulatory Visit: Payer: No Typology Code available for payment source | Admitting: Neurology

## 2023-01-22 DIAGNOSIS — R0683 Snoring: Secondary | ICD-10-CM

## 2023-01-22 DIAGNOSIS — G4733 Obstructive sleep apnea (adult) (pediatric): Secondary | ICD-10-CM | POA: Diagnosis not present

## 2023-01-22 DIAGNOSIS — R351 Nocturia: Secondary | ICD-10-CM

## 2023-01-22 DIAGNOSIS — G901 Familial dysautonomia [Riley-Day]: Secondary | ICD-10-CM

## 2023-01-22 DIAGNOSIS — Z8616 Personal history of COVID-19: Secondary | ICD-10-CM

## 2023-01-22 DIAGNOSIS — Q2112 Patent foramen ovale: Secondary | ICD-10-CM

## 2023-01-22 DIAGNOSIS — G4719 Other hypersomnia: Secondary | ICD-10-CM

## 2023-01-22 DIAGNOSIS — G90A Postural orthostatic tachycardia syndrome (POTS): Secondary | ICD-10-CM

## 2023-01-22 DIAGNOSIS — R000 Tachycardia, unspecified: Secondary | ICD-10-CM

## 2023-01-22 DIAGNOSIS — I341 Nonrheumatic mitral (valve) prolapse: Secondary | ICD-10-CM

## 2023-01-22 DIAGNOSIS — R002 Palpitations: Secondary | ICD-10-CM

## 2023-01-22 DIAGNOSIS — R5383 Other fatigue: Secondary | ICD-10-CM

## 2023-01-22 DIAGNOSIS — R519 Headache, unspecified: Secondary | ICD-10-CM

## 2023-01-22 DIAGNOSIS — Z820 Family history of epilepsy and other diseases of the nervous system: Secondary | ICD-10-CM

## 2023-01-22 DIAGNOSIS — Z9189 Other specified personal risk factors, not elsewhere classified: Secondary | ICD-10-CM

## 2023-01-24 ENCOUNTER — Ambulatory Visit (INDEPENDENT_AMBULATORY_CARE_PROVIDER_SITE_OTHER): Payer: No Typology Code available for payment source | Admitting: Adult Health

## 2023-01-24 DIAGNOSIS — G43719 Chronic migraine without aura, intractable, without status migrainosus: Secondary | ICD-10-CM | POA: Diagnosis not present

## 2023-01-24 MED ORDER — NARATRIPTAN HCL 2.5 MG PO TABS: 2.5000 mg | ORAL_TABLET | ORAL | 12 refills | Status: DC | PRN

## 2023-01-24 MED ORDER — UBRELVY 100 MG PO TABS: 100.0000 mg | ORAL_TABLET | ORAL | 11 refills | Status: AC | PRN

## 2023-01-24 MED ORDER — ONABOTULINUMTOXINA 200 UNITS IJ SOLR
155.0000 [IU] | Freq: Once | INTRAMUSCULAR | Status: AC
Start: 2023-01-24 — End: 2023-01-24
  Administered 2023-01-24: 165 [IU] via INTRAMUSCULAR

## 2023-01-24 NOTE — Progress Notes (Signed)
Botox- 200 units x 1 vial Lot: [id number] Expiration: 03/2025 NDC: 9562-1308-65  Bacteriostatic 0.9% Sodium Chloride- 4mL total Lot: [id number] Expiration: 07/23/23 NDC: 2952-8413-24  Dx: M01.027 SP  Witnessed by: Clemencia Course

## 2023-01-24 NOTE — Progress Notes (Signed)
Returns for repeat Botox.  Prior injection 11/01/2022. Continues to have great improvement of migraines for the first 2 months after botox, in the 3rd month, will have 1-2 migraines per week for the first 2 weeks but the next 2 weeks right before botox is due, will have almost daily migraines. Discussed pursuing Vyepti but she would like to further consider and call if interested in pursuing. Use of naratriptan or Ubrelvy with some benefit, usually takes the edge off but does not completely abort migraine. Continues to have jaw pain and grinding which can worsen migraines. Routinely follows with PT for dry needling and routine massages for cervicalgia.  Tolerated procedure well today. Will return in 3 months for repeat injection.     Consent Form Botulism Toxin Injection For Chronic Migraine    Reviewed orally with patient, additionally signature is on file:  Botulism toxin has been approved by the Federal drug administration for treatment of chronic migraine. Botulism toxin does not cure chronic migraine and it may not be effective in some patients.  The administration of botulism toxin is accomplished by injecting a small amount of toxin into the muscles of the neck and head. Dosage must be titrated for each individual. Any benefits resulting from botulism toxin tend to wear off after 3 months with a repeat injection required if benefit is to be maintained. Injections are usually done every 3-4 months with maximum effect peak achieved by about 2 or 3 weeks. Botulism toxin is expensive and you should be sure of what costs you will incur resulting from the injection.  The side effects of botulism toxin use for chronic migraine may include:   -Transient, and usually mild, facial weakness with facial injections  -Transient, and usually mild, head or neck weakness with head/neck injections  -Reduction or loss of forehead facial animation due to forehead muscle weakness  -Eyelid  drooping  -Dry eye  -Pain at the site of injection or bruising at the site of injection  -Double vision  -Potential unknown long term risks   Contraindications: You should not have Botox if you are pregnant, nursing, allergic to albumin, have an infection, skin condition, or muscle weakness at the site of the injection, or have myasthenia gravis, Lambert-Eaton syndrome, or ALS.  It is also possible that as with any injection, there may be an allergic reaction or no effect from the medication. Reduced effectiveness after repeated injections is sometimes seen and rarely infection at the injection site may occur. All care will be taken to prevent these side effects. If therapy is given over a long time, atrophy and wasting in the muscle injected may occur. Occasionally the patient's become refractory to treatment because they develop antibodies to the toxin. In this event, therapy needs to be modified.  I have read the above information and consent to the administration of botulism toxin.    BOTOX PROCEDURE NOTE FOR MIGRAINE HEADACHE  Contraindications and precautions discussed with patient(above). Aseptic procedure was observed and patient tolerated procedure. Procedure performed by Ihor Austin, AGNP-BC.   The condition has existed for more than 6 months, and pt does not have a diagnosis of ALS, Myasthenia Gravis or Lambert-Eaton Syndrome.  Risks and benefits of injections discussed and pt agrees to proceed with the procedure.  Written consent obtained  These injections are medically necessary. Pt  receives good benefits from these injections. These injections do not cause sedations or hallucinations which the oral therapies may cause.   Description of  procedure:  The patient was placed in a sitting position. The standard protocol was used for Botox as follows, with 5 units of Botox injected at each site:  -Procerus muscle, midline injection  -Corrugator muscle, bilateral  injection  -Frontalis muscle, bilateral injection, with 2 sites each side, medial injection was performed in the upper one third of the frontalis muscle, in the region vertical from the medial inferior edge of the superior orbital rim. The lateral injection was again in the upper one third of the forehead vertically above the lateral limbus of the cornea, 1.5 cm lateral to the medial injection site.  -Temporalis muscle injection, 4 sites, bilaterally. The first injection was 3 cm above the tragus of the ear, second injection site was 1.5 cm to 3 cm up from the first injection site in line with the tragus of the ear. The third injection site was 1.5-3 cm forward between the first 2 injection sites. The fourth injection site was 1.5 cm posterior to the second injection site. 5th site laterally in the temporalis  muscleat the level of the outer canthus.  -Occipitalis muscle injection, 3 sites, bilaterally. The first injection was done one half way between the occipital protuberance and the tip of the mastoid process behind the ear. The second injection site was done lateral and superior to the first, 1 fingerbreadth from the first injection. The third injection site was 1 fingerbreadth superiorly and medially from the first injection site.  -Cervical paraspinal muscle injection, 2 sites, bilaterally. The first injection site was 1 cm from the midline of the cervical spine, 3 cm inferior to the lower border of the occipital protuberance. The second injection site was 1.5 cm superiorly and laterally to the first injection site.  -Trapezius muscle injection was performed at 3 sites, bilaterally. The first injection site was in the upper trapezius muscle halfway between the inflection point of the neck, and the acromion. The second injection site was one half way between the acromion and the first injection site. The third injection was done between the first injection site and the inflection point of the  neck.  -Masseter muscle injection was performed at 1 site, bilaterally.     A total of 200 units of Botox was prepared, 155 units of Botox was injected as documented above, any Botox not injected was wasted. The patient tolerated the procedure well, there were no complications of the above procedure.   Ihor Austin, AGNP-BC  [facility] [address] [place], [place] [address]  Phone [phone] Fax [phone] Note: This document was prepared with digital dictation and possible smart phrase technology. Any transcriptional errors that result from this process are unintentional.

## 2023-01-29 NOTE — Procedures (Signed)
         [facility]  HOME SLEEP TEST (Watch PAT) REPORT  STUDY DATE: 01/23/2023  DOB: 1980-07-26  MRN: [id number]  ORDERING CLINICIAN: Huston Foley, MD, PhD   REFERRING CLINICIAN:   CLINICAL INFORMATION/HISTORY: 42 year old female with an underlying medical history of anemia, mitral valve prolapse, POTS, dysautonomia, history of COVID, ADD, asthma, migraine headaches, reflux disease and borderline overweight state, who reports snoring and excessive daytime somnolence as well as morning headaches.   Epworth sleepiness score: 7/24.  BMI: 25.8 kg/m  FINDINGS:   Sleep Summary:   Total Recording Time (hours, min): 7 hours, 22 min  Total Sleep Time (hours, min):  5 hours, 58 min  Percent REM (%):    13.7%   Respiratory Indices:   Calculated pAHI (per hour):  0.7/hour         REM pAHI:    2.6/hour       NREM pAHI: 0.4/hour  Central pAHI: 0/hour  Oxygen Saturation Statistics:    Oxygen Saturation (%) Mean: 96%   Minimum oxygen saturation (%):                 93%   O2 Saturation Range (%): 93 - 100%    O2 Saturation (minutes) <=88%: 0 min  Pulse Rate Statistics:   Pulse Mean (bpm):    95/min    Pulse Range (67 - 119/min)   IMPRESSION: Primary snoring   RECOMMENDATION:  This home sleep test does not demonstrate any significant obstructive or central sleep disordered breathing with a total AHI of less than 5/hour. Her total AHI was 0.7/hour, O2 nadir of 93%.  Snoring was intermittent and mild.  Treatment with a positive airway pressure device such as AutoPap or CPAP is not indicated based on this test. Snoring may improve with avoidance of the supine sleep position.   For disturbing snoring, an oral appliance through dentistry or orthodontics can be considered.  Other causes of the patient's symptoms, including circadian rhythm disturbances, an underlying mood disorder, medication effect and/or an underlying medical problem cannot be ruled out  based on this test. Clinical correlation is recommended.  The patient should be cautioned not to drive, work at heights, or operate dangerous or heavy equipment when tired or sleepy. Review and reiteration of good sleep hygiene measures should be pursued with any patient. The patient will be advised to follow up with her referring provider, who will be notified of the test results.   I certify that I have reviewed the raw data recording prior to the issuance of this report in accordance with the standards of the American Academy of Sleep Medicine (AASM).  INTERPRETING PHYSICIAN:   Huston Foley, MD, PhD Medical Director, [facility]) Diplomat, ABPN (Neurology and Sleep)   [facility] [address], [address] [place], [place] [address] [phone]

## 2023-02-27 ENCOUNTER — Other Ambulatory Visit: Payer: Self-pay

## 2023-04-09 ENCOUNTER — Other Ambulatory Visit (HOSPITAL_COMMUNITY): Payer: Self-pay

## 2023-04-09 ENCOUNTER — Other Ambulatory Visit: Payer: Self-pay | Admitting: Psychiatry

## 2023-04-09 ENCOUNTER — Other Ambulatory Visit: Payer: Self-pay

## 2023-04-09 DIAGNOSIS — G43719 Chronic migraine without aura, intractable, without status migrainosus: Secondary | ICD-10-CM

## 2023-04-09 MED ORDER — ONABOTULINUMTOXINA 200 UNITS IJ SOLR
INTRAMUSCULAR | 2 refills | Status: DC
Start: 2023-04-09 — End: 2023-12-24
  Filled 2023-04-09: qty 1, 84d supply, fill #0
  Filled 2023-07-04: qty 1, 84d supply, fill #1
  Filled 2023-09-20: qty 1, 84d supply, fill #2

## 2023-04-09 NOTE — Addendum Note (Signed)
Addended by: Judi Cong on: 04/09/2023 10:13 AM   Modules accepted: Orders

## 2023-04-09 NOTE — Telephone Encounter (Signed)
[facility] needs a refill of the Botox sent over.

## 2023-04-09 NOTE — Telephone Encounter (Signed)
Refill sent for the patient to the pharmacy as requested

## 2023-04-09 NOTE — Progress Notes (Signed)
Specialty Pharmacy Refill Coordination Note  LONDEN KONKEL is a 42 y.o. female contacted today regarding refills of specialty medication(s) OnabotulinumtoxinA (BOTOX)   Patient requested Courier to Provider Office   Delivery date: 04/15/23   Verified address: [facility] [address] [place]   Medication will be filled on 12.20.24.  Refill request pending

## 2023-04-12 ENCOUNTER — Other Ambulatory Visit: Payer: Self-pay

## 2023-04-22 ENCOUNTER — Ambulatory Visit (INDEPENDENT_AMBULATORY_CARE_PROVIDER_SITE_OTHER): Payer: No Typology Code available for payment source | Admitting: Adult Health

## 2023-04-22 VITALS — BP 115/68 | HR 86

## 2023-04-22 DIAGNOSIS — G43719 Chronic migraine without aura, intractable, without status migrainosus: Secondary | ICD-10-CM

## 2023-04-22 MED ORDER — ONABOTULINUMTOXINA 200 UNITS IJ SOLR
155.0000 [IU] | Freq: Once | INTRAMUSCULAR | Status: AC
Start: 2023-04-22 — End: 2023-04-22
  Administered 2023-04-22: 165 [IU] via INTRAMUSCULAR

## 2023-04-22 NOTE — Progress Notes (Signed)
Update 04/22/2023 JM: Returns for repeat Botox.  Prior injection on 01/24/2023.  Botox continues to work well for the first 2 months but the month prior to repeat injection, she experiences frequent migraines which gradually worsens until repeat injection. Notes improvement of jaw pain, clenching and grinding after masseter injection which overall helped reduce migraines as well. Previously discussed switching to Vyepti due to Botox wearing off after 2 months but as botox does manage migraines very well for the first 2 months, she prefers not to switch currently.  She questions receiving Botox more frequently even if she is required to pay out-of-pocket, will further discuss ability/safety to do this with our headache specialist Dr. Lucia Gaskins and will keep patient updated.        Consent Form Botulism Toxin Injection For Chronic Migraine    Reviewed orally with patient, additionally signature is on file:  Botulism toxin has been approved by the Federal drug administration for treatment of chronic migraine. Botulism toxin does not cure chronic migraine and it may not be effective in some patients.  The administration of botulism toxin is accomplished by injecting a small amount of toxin into the muscles of the neck and head. Dosage must be titrated for each individual. Any benefits resulting from botulism toxin tend to wear off after 3 months with a repeat injection required if benefit is to be maintained. Injections are usually done every 3-4 months with maximum effect peak achieved by about 2 or 3 weeks. Botulism toxin is expensive and you should be sure of what costs you will incur resulting from the injection.  The side effects of botulism toxin use for chronic migraine may include:   -Transient, and usually mild, facial weakness with facial injections  -Transient, and usually mild, head or neck weakness with head/neck injections  -Reduction or loss of forehead facial animation due to  forehead muscle weakness  -Eyelid drooping  -Dry eye  -Pain at the site of injection or bruising at the site of injection  -Double vision  -Potential unknown long term risks   Contraindications: You should not have Botox if you are pregnant, nursing, allergic to albumin, have an infection, skin condition, or muscle weakness at the site of the injection, or have myasthenia gravis, Lambert-Eaton syndrome, or ALS.  It is also possible that as with any injection, there may be an allergic reaction or no effect from the medication. Reduced effectiveness after repeated injections is sometimes seen and rarely infection at the injection site may occur. All care will be taken to prevent these side effects. If therapy is given over a long time, atrophy and wasting in the muscle injected may occur. Occasionally the patient's become refractory to treatment because they develop antibodies to the toxin. In this event, therapy needs to be modified.  I have read the above information and consent to the administration of botulism toxin.    BOTOX PROCEDURE NOTE FOR MIGRAINE HEADACHE  Contraindications and precautions discussed with patient(above). Aseptic procedure was observed and patient tolerated procedure. Procedure performed by Ihor Austin, AGNP-BC.   The condition has existed for more than 6 months, and pt does not have a diagnosis of ALS, Myasthenia Gravis or Lambert-Eaton Syndrome.  Risks and benefits of injections discussed and pt agrees to proceed with the procedure.  Written consent obtained  These injections are medically necessary. Pt  receives good benefits from these injections. These injections do not cause sedations or hallucinations which the oral therapies may cause.  Description of procedure:  The patient was placed in a sitting position. The standard protocol was used for Botox as follows, with 5 units of Botox injected at each site:  -Procerus muscle, midline  injection  -Corrugator muscle, bilateral injection  -Frontalis muscle, bilateral injection, with 2 sites each side, medial injection was performed in the upper one third of the frontalis muscle, in the region vertical from the medial inferior edge of the superior orbital rim. The lateral injection was again in the upper one third of the forehead vertically above the lateral limbus of the cornea, 1.5 cm lateral to the medial injection site.  -Temporalis muscle injection, 4 sites, bilaterally. The first injection was 3 cm above the tragus of the ear, second injection site was 1.5 cm to 3 cm up from the first injection site in line with the tragus of the ear. The third injection site was 1.5-3 cm forward between the first 2 injection sites. The fourth injection site was 1.5 cm posterior to the second injection site. 5th site laterally in the temporalis  muscleat the level of the outer canthus.  -Occipitalis muscle injection, 3 sites, bilaterally. The first injection was done one half way between the occipital protuberance and the tip of the mastoid process behind the ear. The second injection site was done lateral and superior to the first, 1 fingerbreadth from the first injection. The third injection site was 1 fingerbreadth superiorly and medially from the first injection site.  -Cervical paraspinal muscle injection, 2 sites, bilaterally. The first injection site was 1 cm from the midline of the cervical spine, 3 cm inferior to the lower border of the occipital protuberance. The second injection site was 1.5 cm superiorly and laterally to the first injection site.  -Trapezius muscle injection was performed at 3 sites, bilaterally. The first injection site was in the upper trapezius muscle halfway between the inflection point of the neck, and the acromion. The second injection site was one half way between the acromion and the first injection site. The third injection was done between the first injection  site and the inflection point of the neck.  -Masseter muscle injection was performed at 1 site, bilaterally.       A total of 200 units of Botox was prepared, 165 units of Botox was injected as documented above, any Botox not injected was wasted. The patient tolerated the procedure well, there were no complications of the above procedure.   Ihor Austin, AGNP-BC  [facility] [address] [place], [place] [address]  Phone [phone] Fax [phone] Note: This document was prepared with digital dictation and possible smart phrase technology. Any transcriptional errors that result from this process are unintentional.

## 2023-04-22 NOTE — Progress Notes (Signed)
Botox- 200 units x 1 vial Lot: [id number] Expiration: 12/2024 NDC: 4540-9811-91  Bacteriostatic 0.9% Sodium Chloride- 4mL total [id number] Expiration: 02/22/24 NDC: 3086-5784-69  Dx: G43.719 SP  Witnessed by: Clemencia Course

## 2023-04-25 ENCOUNTER — Encounter: Payer: Self-pay | Admitting: Adult Health

## 2023-05-01 ENCOUNTER — Ambulatory Visit: Payer: No Typology Code available for payment source | Admitting: Adult Health

## 2023-05-02 MED ORDER — QULIPTA 60 MG PO TABS: 60.0000 mg | ORAL_TABLET | Freq: Every day | ORAL | 11 refills | Status: DC

## 2023-06-18 ENCOUNTER — Other Ambulatory Visit: Payer: Self-pay

## 2023-06-18 ENCOUNTER — Telehealth: Payer: Self-pay | Admitting: Adult Health

## 2023-06-18 ENCOUNTER — Encounter: Payer: Self-pay | Admitting: Sports Medicine

## 2023-06-18 ENCOUNTER — Ambulatory Visit (INDEPENDENT_AMBULATORY_CARE_PROVIDER_SITE_OTHER): Payer: Commercial Managed Care - PPO | Admitting: Sports Medicine

## 2023-06-18 DIAGNOSIS — M25552 Pain in left hip: Secondary | ICD-10-CM

## 2023-06-18 DIAGNOSIS — M4316 Spondylolisthesis, lumbar region: Secondary | ICD-10-CM | POA: Diagnosis not present

## 2023-06-18 DIAGNOSIS — M5442 Lumbago with sciatica, left side: Secondary | ICD-10-CM | POA: Diagnosis not present

## 2023-06-18 DIAGNOSIS — G8929 Other chronic pain: Secondary | ICD-10-CM | POA: Diagnosis not present

## 2023-06-18 NOTE — Telephone Encounter (Signed)
 Auth#: [id number] (06/18/23-06/17/24)

## 2023-06-18 NOTE — Progress Notes (Unsigned)
 Patient says that she has had pain in the left hip and low back for about one month after her daughter ran at her and hung on to her while grabbing around her waist. She says that in the last week or two her pain has gotten significantly worse. She says that she has always had popping in the hips. She has had difficulty walking and feels that she is now compensating. Patient says that her pain is in both the front and the back of the hip but seems to now be radiating around to the side and down the leg to the knee. She says that she has taken OTC pain medications, Tramadol, Oxycodone, and CBD/CBG with no relief. She has a trip to [place] this week where she will be walking a lot and is inquiring about something to give her relief to get her through that trip, but ultimately is inquiring about long term solutions.

## 2023-06-18 NOTE — Progress Notes (Unsigned)
 Cheryl Lara - 43 y.o. female MRN [id number]  Date of birth: 1980/11/01  Office Visit Note: Visit Date: 06/18/2023 PCP: Simone Curia, MD Referred by: Simone Curia, MD  Subjective: Chief Complaint  Patient presents with  . Left Hip - Pain   HPI: Cheryl Lara is a pleasant 43 y.o. female who presents today for chronic left hip pain as well as low back pain and L-leg radicular symptoms.  ***  Pertinent ROS were reviewed with the patient and found to be negative unless otherwise specified above in HPI.   Assessment & Plan: Visit Diagnoses: No diagnosis found.  Plan: ***  Follow-up: No follow-ups on file.   Meds & Orders: No orders of the defined types were placed in this encounter.  No orders of the defined types were placed in this encounter.    Procedures: Large Joint Inj: L hip joint on 06/18/2023 2:51 PM Indications: pain Details: 22 G 3.5 in needle, ultrasound-guided anterior approach Medications: 4 mL lidocaine 1 %; 40 mg methylPREDNISolone acetate 40 MG/ML Outcome: tolerated well, no immediate complications  Procedure:US-guided intra-articular hip injection, left After discussion on risks/benefits/indications and informed verbal consent was obtained, a timeout was performed. Patient was lyingsupine on exam table. The hip was cleaned with betadine andalcohol swabs. Then utilizing ultrasound guidance, the patient's femoral head and neck junction was identified and subsequently injected with4:1 lidocaine:depomedrol via an in-plane approach with ultrasound visualization of the injectate administered into the hip joint. Patient tolerated procedure well without immediate complications.  Procedure, treatment alternatives, risks and benefits explained, specific risks discussed. Consent was given by the patient. Immediately prior to procedure a time out was called to verify the correct patient, procedure, equipment, support staff and site/side marked as required. Patient was  prepped and draped in the usual sterile fashion.         Clinical History: No specialty comments available.  She reports that she has never smoked. She has never used smokeless tobacco. No results for input(s): "HGBA1C", "LABURIC" in the last 8760 hours.  Objective:    Physical Exam  Gen: Well-appearing, in no acute distress; non-toxic CV: Well-perfused. Warm.  Resp: Breathing unlabored on room air; no wheezing. Psych: Fluid speech in conversation; appropriate affect; normal thought process  Ortho Exam - Left hip:  - Lumbar:   Imaging: No results found.  *** hips and lumbar    Past Medical/Family/Surgical/Social History: Medications & Allergies reviewed per EMR, new medications updated. Patient Active Problem List   Diagnosis Date Noted  . POTS (postural orthostatic tachycardia syndrome) 04/03/2022  . Raynaud phenomenon 08/28/2021  . Mitral valve anterior leaflet prolapse 02/10/2021  . COVID-19 long hauler manifesting chronic dyspnea 01/31/2021  . Rapid palpitations 01/31/2021  . Chest pressure 01/31/2021  . DOE (dyspnea on exertion) 01/31/2021  . Gastroesophageal reflux disease 06/05/2019  . Encounter for colonoscopy due to history of adenomatous colonic polyps 06/05/2019  . Preop examination 06/05/2019  . Postpartum care following cesarean delivery of Di-Di twins (8/6) 11/27/2014  . Iron deficiency anemia 11/27/2014  . Dichorionic diamniotic twin pregnancy in third trimester 11/20/2014  . Preterm uterine contractions in third trimester, antepartum 11/20/2014  . Twins 11/20/2014  . [redacted] weeks gestation of pregnancy   . Encounter for fetal anatomic survey   . Partial small bowel obstruction (HCC) 01/03/2014  . SBO (small bowel obstruction) (HCC) 01/03/2014  . Acute blood loss anemia 10/04/2012   Past Medical History:  Diagnosis Date  . Acute blood loss anemia 10/04/2012  .  ADD (attention deficit disorder)   . Asthma    exercise induced  . Celiac disease   .  COVID-19 long hauler manifesting chronic palpitations 10/23/2020   Has had prolonged symptoms of chronic cough, dyspnea and fatigue.  . Dichorionic diamniotic twin pregnancy in third trimester 11/20/2014  . GERD (gastroesophageal reflux disease)   . HPV (human papilloma virus) infection   . Iron deficiency anemia 11/27/2014  . Mitral valve prolapse   . Postpartum care following cesarean delivery of Di-Di twins (8/6) 11/27/2014  . Postural orthostatic tachycardia syndrome (POTS)   . Preterm uterine contractions in third trimester, antepartum 11/20/2014  . Scarlet fever    hx of   Family History  Problem Relation Age of Onset  . Cancer Mother   . CAD Mother   . Hypertension Mother   . Hypertension Father   . CAD Father 64       Followed by Dr. Herbie Baltimore; ~CABG @ age ~23  . Cancer Maternal Grandmother   . Diabetes Maternal Grandmother   . Diabetes type II Maternal Grandmother   . Colon cancer Maternal Grandfather   . Breast cancer Paternal Grandmother   . Stroke Paternal Grandmother   . Esophageal cancer Neg Hx   . Rectal cancer Neg Hx   . Stomach cancer Neg Hx    Past Surgical History:  Procedure Laterality Date  . CARDIOPULMONARY EXERCISE TEST (CPX)  02/13/2021   Normal functional capacity.  No cardiopulmonary abnormalities.  No evidence of any exercise-induced bronchospasm  . CESAREAN SECTION N/A 11/27/2014   Procedure: CESAREAN SECTION;  Surgeon: Olivia Mackie, MD;  Location: [facility];  Service: Obstetrics;  Laterality: N/A;  . COLONOSCOPY    . CRYOABLATION    . LAPAROSCOPIC NISSEN FUNDOPLICATION  2004  . polyp     removal 2009  . Sphenopalatine Ganglion Block  03/28/2022  . TRANSTHORACIC ECHOCARDIOGRAM  02/10/2021   EF 60 to 65%.  No R WMA.  GR 1 DD?Marland Kitchen  Normal RV size and function.  Normal aortic valve.  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  . TYMPANOSTOMY TUBE PLACEMENT    . UPPER GASTROINTESTINAL ENDOSCOPY    . WISDOM TOOTH EXTRACTION     Social  History   Occupational History  . Not on file  Tobacco Use  . Smoking status: Never  . Smokeless tobacco: Never  Vaping Use  . Vaping status: Never Used  Substance and Sexual Activity  . Alcohol use: Yes    Comment: occ  . Drug use: No  . Sexual activity: Yes    Birth control/protection: None

## 2023-06-19 ENCOUNTER — Encounter: Payer: Self-pay | Admitting: Sports Medicine

## 2023-06-19 MED ORDER — METHYLPREDNISOLONE ACETATE 40 MG/ML IJ SUSP
40.0000 mg | INTRAMUSCULAR | Status: AC | PRN
  Administered 2023-06-18: 40 mg via INTRA_ARTICULAR

## 2023-06-19 MED ORDER — LIDOCAINE HCL 1 % IJ SOLN
4.0000 mL | INTRAMUSCULAR | Status: AC | PRN
  Administered 2023-06-18: 4 mL

## 2023-06-20 NOTE — Telephone Encounter (Signed)
   Cheryl Lara is a 43 year-old female who I have seen and evaluated in my clinic.  She has been treated for an orthopedic issue.  This note is for recommendation of a disability pass and/or ambulation assistance for [facility], as periods of prolonged standing or walking will exacerbate her underlying orthopedic condition.  If deemed fit by [facility], I would recommend an attraction assistance pass or other transportation resources such as a motorized scooter/wheelchair where prolonged standing and walking may be required.  If there are any further questions, please do not hesitate to call the office [phone].  Madelyn Brunner, DO Primary Care Sports Medicine Physician  [facility]

## 2023-06-25 ENCOUNTER — Encounter: Payer: Self-pay | Admitting: Sports Medicine

## 2023-06-26 ENCOUNTER — Other Ambulatory Visit: Payer: Self-pay | Admitting: Sports Medicine

## 2023-06-26 DIAGNOSIS — M4316 Spondylolisthesis, lumbar region: Secondary | ICD-10-CM

## 2023-06-26 DIAGNOSIS — G8929 Other chronic pain: Secondary | ICD-10-CM

## 2023-06-26 NOTE — Telephone Encounter (Signed)
 PT referral sent to [facility] for chronic left hip pain and labral tearing and chronic midline low back pain with left-sided sciatica.

## 2023-07-04 ENCOUNTER — Other Ambulatory Visit: Payer: Self-pay

## 2023-07-04 ENCOUNTER — Other Ambulatory Visit: Payer: Self-pay | Admitting: Pharmacy Technician

## 2023-07-04 NOTE — Progress Notes (Signed)
 Specialty Pharmacy Refill Coordination Note  Cheryl Lara is a 43 y.o. female assessed today regarding refills of clinic administered specialty medication(s) OnabotulinumtoxinA (BOTOX)  Spoke with Alphonsa Gin  Clinic requested Courier to Provider Office   Delivery date: 07/08/23   Verified address: [facility] [address]   Medication will be filled on 07/05/23.

## 2023-07-11 ENCOUNTER — Encounter: Payer: Self-pay | Admitting: Sports Medicine

## 2023-07-12 ENCOUNTER — Ambulatory Visit
Admission: RE | Admit: 2023-07-12 | Discharge: 2023-07-12 | Disposition: A | Discharge status: Home / Self Care | Payer: Commercial Managed Care - PPO | Source: Ambulatory Visit | Attending: Sports Medicine | Admitting: Sports Medicine

## 2023-07-12 DIAGNOSIS — G8929 Other chronic pain: Secondary | ICD-10-CM

## 2023-07-12 MED ORDER — IOPAMIDOL (ISOVUE-M 200) INJECTION 41%
10.0000 mL | Freq: Once | INTRAMUSCULAR | Status: AC
  Administered 2023-07-12: 10 mL via INTRA_ARTICULAR

## 2023-07-15 ENCOUNTER — Ambulatory Visit: Payer: No Typology Code available for payment source | Admitting: Adult Health

## 2023-07-15 DIAGNOSIS — G43719 Chronic migraine without aura, intractable, without status migrainosus: Secondary | ICD-10-CM | POA: Diagnosis not present

## 2023-07-15 MED ORDER — ONABOTULINUMTOXINA 200 UNITS IJ SOLR
155.0000 [IU] | Freq: Once | INTRAMUSCULAR | Status: AC
  Administered 2023-07-15: 165 [IU] via INTRAMUSCULAR

## 2023-07-15 NOTE — Progress Notes (Signed)
 Update 07/15/2023 JM: Returns for repeat Botox.  Prior injection on 04/22/2023.  After prior visit, she was started on Qulipta in addition to Botox due to wearing off of Botox 1 month prior to next injection. She never started [place] as migraines improved after receiving steroid injection for left hip pain and was also on a steroid taper pack.  She has noticed some increased migraines over the past 2 weeks but previously migraines occur minimally.  Continued improvement of jaw pain, clenching and grinding with masseter injection which in turn to help reduce migraine frequency.  She will call office if interested in initiating Qulipta but at this time would like to hold off.  Tolerated procedure well today.  Return in 3 months for repeat injection.        Consent Form Botulism Toxin Injection For Chronic Migraine    Reviewed orally with patient, additionally signature is on file:  Botulism toxin has been approved by the Federal drug administration for treatment of chronic migraine. Botulism toxin does not cure chronic migraine and it may not be effective in some patients.  The administration of botulism toxin is accomplished by injecting a small amount of toxin into the muscles of the neck and head. Dosage must be titrated for each individual. Any benefits resulting from botulism toxin tend to wear off after 3 months with a repeat injection required if benefit is to be maintained. Injections are usually done every 3-4 months with maximum effect peak achieved by about 2 or 3 weeks. Botulism toxin is expensive and you should be sure of what costs you will incur resulting from the injection.  The side effects of botulism toxin use for chronic migraine may include:   -Transient, and usually mild, facial weakness with facial injections  -Transient, and usually mild, head or neck weakness with head/neck injections  -Reduction or loss of forehead facial animation due to forehead muscle  weakness  -Eyelid drooping  -Dry eye  -Pain at the site of injection or bruising at the site of injection  -Double vision  -Potential unknown long term risks   Contraindications: You should not have Botox if you are pregnant, nursing, allergic to albumin, have an infection, skin condition, or muscle weakness at the site of the injection, or have myasthenia gravis, Lambert-Eaton syndrome, or ALS.  It is also possible that as with any injection, there may be an allergic reaction or no effect from the medication. Reduced effectiveness after repeated injections is sometimes seen and rarely infection at the injection site may occur. All care will be taken to prevent these side effects. If therapy is given over a long time, atrophy and wasting in the muscle injected may occur. Occasionally the patient's become refractory to treatment because they develop antibodies to the toxin. In this event, therapy needs to be modified.  I have read the above information and consent to the administration of botulism toxin.    BOTOX PROCEDURE NOTE FOR MIGRAINE HEADACHE  Contraindications and precautions discussed with patient(above). Aseptic procedure was observed and patient tolerated procedure. Procedure performed by Ihor Austin, AGNP-BC.   The condition has existed for more than 6 months, and pt does not have a diagnosis of ALS, Myasthenia Gravis or Lambert-Eaton Syndrome.  Risks and benefits of injections discussed and pt agrees to proceed with the procedure.  Written consent obtained  These injections are medically necessary. Pt  receives good benefits from these injections. These injections do not cause sedations or hallucinations which the  oral therapies may cause.   Description of procedure:  The patient was placed in a sitting position. The standard protocol was used for Botox as follows, with 5 units of Botox injected at each site:  -Procerus muscle, midline injection  -Corrugator muscle,  bilateral injection  -Frontalis muscle, bilateral injection, with 2 sites each side, medial injection was performed in the upper one third of the frontalis muscle, in the region vertical from the medial inferior edge of the superior orbital rim. The lateral injection was again in the upper one third of the forehead vertically above the lateral limbus of the cornea, 1.5 cm lateral to the medial injection site.  -Temporalis muscle injection, 4 sites, bilaterally. The first injection was 3 cm above the tragus of the ear, second injection site was 1.5 cm to 3 cm up from the first injection site in line with the tragus of the ear. The third injection site was 1.5-3 cm forward between the first 2 injection sites. The fourth injection site was 1.5 cm posterior to the second injection site. 5th site laterally in the temporalis  muscleat the level of the outer canthus.  -Occipitalis muscle injection, 3 sites, bilaterally. The first injection was done one half way between the occipital protuberance and the tip of the mastoid process behind the ear. The second injection site was done lateral and superior to the first, 1 fingerbreadth from the first injection. The third injection site was 1 fingerbreadth superiorly and medially from the first injection site.  -Cervical paraspinal muscle injection, 2 sites, bilaterally. The first injection site was 1 cm from the midline of the cervical spine, 3 cm inferior to the lower border of the occipital protuberance. The second injection site was 1.5 cm superiorly and laterally to the first injection site.  -Trapezius muscle injection was performed at 3 sites, bilaterally. The first injection site was in the upper trapezius muscle halfway between the inflection point of the neck, and the acromion. The second injection site was one half way between the acromion and the first injection site. The third injection was done between the first injection site and the inflection point of  the neck.  -Masseter muscle injection was performed at 1 site, bilaterally.       A total of 200 units of Botox was prepared, 165 units of Botox was injected as documented above, any Botox not injected was wasted. The patient tolerated the procedure well, there were no complications of the above procedure.   Ihor Austin, AGNP-BC  [facility] [address] [place], [place] [address]  Phone [phone] Fax [phone] Note: This document was prepared with digital dictation and possible smart phrase technology. Any transcriptional errors that result from this process are unintentional.

## 2023-07-15 NOTE — Progress Notes (Signed)
 Botox- 200 units x 1 vial Lot: [id number] Expiration: 07/2025 NDC: 1610-9604-54  Bacteriostatic 0.9% Sodium Chloride- * mL  Lot: [id number] Expiration: 08/15/2023 NDC: 1478-2956-21  Dx: H08.657. S/P  Witnessed By Carmelina Peal, RN

## 2023-07-22 ENCOUNTER — Encounter: Payer: Self-pay | Admitting: Sports Medicine

## 2023-07-22 ENCOUNTER — Ambulatory Visit (INDEPENDENT_AMBULATORY_CARE_PROVIDER_SITE_OTHER): Admitting: Sports Medicine

## 2023-07-22 DIAGNOSIS — M25552 Pain in left hip: Secondary | ICD-10-CM

## 2023-07-22 DIAGNOSIS — M5442 Lumbago with sciatica, left side: Secondary | ICD-10-CM

## 2023-07-22 DIAGNOSIS — G8929 Other chronic pain: Secondary | ICD-10-CM | POA: Diagnosis not present

## 2023-07-22 DIAGNOSIS — M4316 Spondylolisthesis, lumbar region: Secondary | ICD-10-CM | POA: Diagnosis not present

## 2023-07-22 NOTE — Progress Notes (Signed)
 Patient says that the injection did seem to help. She says that day 7 after the injection was her best day, and she had that level of relief for about 1 week. She says that since then her pain is gradually returning. She says that it is different in that it does not seem to radiate around the side of the hip, but does still feel deep in the groin and can be sharp pain down the front of the leg. She also has pain returning in the back of the hip. She is in physical therapy and says that dry needling does give her good relief, although it is temporary.

## 2023-07-22 NOTE — Progress Notes (Signed)
 Cheryl Lara - 43 y.o. female MRN [id number]  Date of birth: 03-May-1980  Office Visit Note: Visit Date: 07/22/2023 PCP: Simone Curia, MD Referred by: Simone Curia, MD  Subjective: Chief Complaint  Patient presents with   Left Hip - Follow-up   HPI: Cheryl Lara is a pleasant 43 y.o. female who presents today for follow-up of chronic left hip pain and low back with L-sided sciatica.  Left hip: Back on 06/18/2023 we did perform an ultrasound-guided left hip injection for both diagnostic and therapeutic purposes.  She did report that the injection certainly helped, it took a few days to a week but then after 1 week she was feeling quite excellent.  This unfortunately only lasted about 2 or 3 weeks and then her pain is started to slowly return.  She feels the pain in the hip as well as the groin.  More of her pain will radiate down the front of her leg.  She is in physical therapy and doing dry needling which does give her good temporary relief of the muscles.  Lumbar/Low back: Her low back is also bothering her, continues in the midline and the left side.  She will get a popping or painful sensation when she goes from flexing forward to extending.  She has worse pain with extension.  She does have radicular pain that goes down the posterior aspect of the thigh to the knee, she has had times where the radiating pain goes all the way down into the left foot.  She does have symptoms of urinary urgency but no bowel or bladder incontinence.  His Robaxin at nighttime and occasionally during the day when her pain is better.  Pertinent ROS were reviewed with the patient and found to be negative unless otherwise specified above in HPI.   Assessment & Plan: Visit Diagnoses:  1. Chronic left hip pain   2. Chronic midline low back pain with left-sided sciatica   3. Anterolisthesis of lumbar spine    Plan: Impression is chronic left hip pain which does seem to be emanating from the intra-articular hip joint.   She really does not have any significant arthritis in the hip.  We did review her MRI which shows some mild signaling with blunting of the labrum, but I cannot appreciate any notable tearing.  She is hypermobile, specifically in the hip joint and I do think this is contributing to her hip pain.  She did get good relief from the injection but only lasted for a few weeks, which does give [place] diagnostic benefit that she does have pain from the hip.  I would like her to see my partner, Dr. Steward Drone to evaluate the hip itself, review her MRI and see if there is any other additional treatments (conservative vs. Surgical) that may be helpful for her given her pain is still bothersome.  We did discuss the role of possible PRP injection therapy into the hip as well.  In terms of the low back, she does have degenerative disc disease with anterior listhesis and ongoing left-sided sciatica symptoms down the leg.  We need to evaluate this further with MRI, this was ordered today.  I will message/call her once I receive these results to discuss follow-up or possible referral.  She may continue her Robaxin 500 mg nightly as well as as needed during the day as needed only.  Follow-up: Return for make appt with Dr. Steward Drone for L-hip eval/labrum.   Meds & Orders: No orders of  the defined types were placed in this encounter.   Orders Placed This Encounter  Procedures   MR Lumbar Spine w/o contrast     Procedures: No procedures performed      Clinical History: No specialty comments available.  She reports that she has never smoked. She has never used smokeless tobacco. No results for input(s): "HGBA1C", "LABURIC" in the last 8760 hours.  Objective:    Physical Exam  Gen: Well-appearing, in no acute distress; non-toxic CV: Well-perfused. Warm.  Resp: Breathing unlabored on room air; no wheezing. Psych: Fluid speech in conversation; appropriate affect; normal thought process  Ortho Exam - Left hip: No bony  TTP.  There is fluid range of motion with internal and external logroll, there is rather excessive external range of motion but she is able to get to at least 90 degrees in the Indian-sitting position. + FADIR test, positive Stinchfield test, negative FABER test.  There is definite hypermobility of both the left and right hip.  - Lumbar: There is pain near the midline and on the left side of the lower lumbar spine near the L5-S1 region.  There is mild pain over the SI joint location although less significant.  There is pain with extension and reproduction of pain with both straight leg raise as well as Gaenslen's test.  There is 5/5 strength of bilateral lower extremities other than associated pain activating hip flexion.  Imaging:  MR HIP LEFT W CONTRAST CLINICAL DATA:  Hip pain, chronic, tendon/bursal abnormality suspected, xray done SUSPECTED LABRUM TEAR  Chronic left hip pain and weakness for 2 months. Previous injection with relief. No prior relevant surgery.  EXAM: MRI OF THE LEFT HIP WITH CONTRAST (MR Arthrogram)  TECHNIQUE: Multiplanar, multisequence MR imaging of the hip was performed immediately following contrast injection into the hip joint under fluoroscopic guidance. No intravenous contrast was administered.  COMPARISON:  Injection images same date. Left hip radiographs 06/14/2023.  FINDINGS: Bones: There is no evidence of acute fracture, dislocation or femoral head osteonecrosis. The visualized bony pelvis appears normal. The visualized sacroiliac joints and symphysis pubis appear normal.  Articular cartilage and labrum  Articular cartilage: No focal chondral defect or subchondral signal abnormality identified.  Labrum: No evidence of labral tear or paralabral cyst.  Joint or bursal effusion  Joint effusion: The left hip joint is adequately distended with contrast. No evidence intra-articular loose body. No significant right hip joint effusion.  Bursae: No  focal periarticular fluid collection.  Muscles and tendons  Muscles and tendons: The visualized gluteus, hamstring and iliopsoas tendons appear normal. No focal muscular atrophy or edema. The piriformis muscles appear symmetric.  Other findings  Miscellaneous: Edema within the soft tissues anterior to the left hip attributed to the injection. No inguinal abnormalities are identified. Intrauterine device noted. The visualized internal pelvic contents are otherwise unremarkable.  IMPRESSION: No acute findings or explanation for the patient's symptoms. No evidence of labral tear or other internal derangement.  Electronically Signed   By: Carey Bullocks M.D.   On: 07/22/2023 08:45  Past Medical/Family/Surgical/Social History: Medications & Allergies reviewed per EMR, new medications updated. Patient Active Problem List   Diagnosis Date Noted   POTS (postural orthostatic tachycardia syndrome) 04/03/2022   Raynaud phenomenon 08/28/2021   Mitral valve anterior leaflet prolapse 02/10/2021   COVID-19 long hauler manifesting chronic dyspnea 01/31/2021   Rapid palpitations 01/31/2021   Chest pressure 01/31/2021   DOE (dyspnea on exertion) 01/31/2021   Gastroesophageal reflux disease 06/05/2019   Encounter  for colonoscopy due to history of adenomatous colonic polyps 06/05/2019   Preop examination 06/05/2019   Postpartum care following cesarean delivery of Di-Di twins (8/6) 11/27/2014   Iron deficiency anemia 11/27/2014   Dichorionic diamniotic twin pregnancy in third trimester 11/20/2014   Preterm uterine contractions in third trimester, antepartum 11/20/2014   Twins 11/20/2014   [redacted] weeks gestation of pregnancy    Encounter for fetal anatomic survey    Partial small bowel obstruction (HCC) 01/03/2014   SBO (small bowel obstruction) (HCC) 01/03/2014   Acute blood loss anemia 10/04/2012   Past Medical History:  Diagnosis Date   Acute blood loss anemia 10/04/2012   ADD (attention  deficit disorder)    Asthma    exercise induced   Celiac disease    COVID-19 long hauler manifesting chronic palpitations 10/23/2020   Has had prolonged symptoms of chronic cough, dyspnea and fatigue.   Dichorionic diamniotic twin pregnancy in third trimester 11/20/2014   GERD (gastroesophageal reflux disease)    HPV (human papilloma virus) infection    Iron deficiency anemia 11/27/2014   Mitral valve prolapse    Postpartum care following cesarean delivery of Di-Di twins (8/6) 11/27/2014   Postural orthostatic tachycardia syndrome (POTS)    Preterm uterine contractions in third trimester, antepartum 11/20/2014   Scarlet fever    hx of   Family History  Problem Relation Age of Onset   Cancer Mother    CAD Mother    Hypertension Mother    Hypertension Father    CAD Father 36       Followed by Dr. Herbie Baltimore; ~CABG @ age ~69   Cancer Maternal Grandmother    Diabetes Maternal Grandmother    Diabetes type II Maternal Grandmother    Colon cancer Maternal Grandfather    Breast cancer Paternal Grandmother    Stroke Paternal Grandmother    Esophageal cancer Neg Hx    Rectal cancer Neg Hx    Stomach cancer Neg Hx    Past Surgical History:  Procedure Laterality Date   CARDIOPULMONARY EXERCISE TEST (CPX)  02/13/2021   Normal functional capacity.  No cardiopulmonary abnormalities.  No evidence of any exercise-induced bronchospasm   CESAREAN SECTION N/A 11/27/2014   Procedure: CESAREAN SECTION;  Surgeon: Olivia Mackie, MD;  Location: [facility];  Service: Obstetrics;  Laterality: N/A;   COLONOSCOPY     CRYOABLATION     LAPAROSCOPIC NISSEN FUNDOPLICATION  2004   polyp     removal 2009   Sphenopalatine Ganglion Block  03/28/2022   TRANSTHORACIC ECHOCARDIOGRAM  02/10/2021   EF 60 to 65%.  No R WMA.  GR 1 DD?Marland Kitchen  Normal RV size and function.  Normal aortic valve.  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   TYMPANOSTOMY TUBE PLACEMENT     UPPER GASTROINTESTINAL ENDOSCOPY      WISDOM TOOTH EXTRACTION     Social History   Occupational History   Not on file  Tobacco Use   Smoking status: Never   Smokeless tobacco: Never  Vaping Use   Vaping status: Never Used  Substance and Sexual Activity   Alcohol use: Yes    Comment: occ   Drug use: No   Sexual activity: Yes    Birth control/protection: None

## 2023-07-25 ENCOUNTER — Encounter: Payer: Self-pay | Admitting: Sports Medicine

## 2023-07-29 ENCOUNTER — Ambulatory Visit
Admission: RE | Admit: 2023-07-29 | Discharge: 2023-07-29 | Disposition: A | Discharge status: Home / Self Care | Source: Ambulatory Visit | Attending: Sports Medicine | Admitting: Sports Medicine

## 2023-07-29 DIAGNOSIS — G8929 Other chronic pain: Secondary | ICD-10-CM

## 2023-07-31 ENCOUNTER — Ambulatory Visit (HOSPITAL_BASED_OUTPATIENT_CLINIC_OR_DEPARTMENT_OTHER): Admitting: Orthopaedic Surgery

## 2023-07-31 ENCOUNTER — Ambulatory Visit (HOSPITAL_BASED_OUTPATIENT_CLINIC_OR_DEPARTMENT_OTHER): Payer: Self-pay | Admitting: Orthopaedic Surgery

## 2023-07-31 DIAGNOSIS — S73192A Other sprain of left hip, initial encounter: Secondary | ICD-10-CM

## 2023-07-31 NOTE — Progress Notes (Signed)
 Chief Complaint: Left hip pain     History of Present Illness:    Cheryl Lara is a 43 y.o. female presents today with many years of left hip pain.  This has been chronic in nature.  She is experiencing this in a C-shaped distribution although she has also experiencing some pain in the SI joint.  She did get very good relief from a left hip intra-articular femoral acetabular injection with Dr. Shon Baton.  She has been undergoing physical therapy with needling.  She is having a pain with sitting standing and most activities of daily living.  Being active and has 3 young children but is unable to do this as result of her hip pain.    PMH/PSH/Family History/Social History/Meds/Allergies:    Past Medical History:  Diagnosis Date   Acute blood loss anemia 10/04/2012   ADD (attention deficit disorder)    Asthma    exercise induced   Celiac disease    COVID-19 long hauler manifesting chronic palpitations 10/23/2020   Has had prolonged symptoms of chronic cough, dyspnea and fatigue.   Dichorionic diamniotic twin pregnancy in third trimester 11/20/2014   GERD (gastroesophageal reflux disease)    HPV (human papilloma virus) infection    Iron deficiency anemia 11/27/2014   Mitral valve prolapse    Postpartum care following cesarean delivery of Di-Di twins (8/6) 11/27/2014   Postural orthostatic tachycardia syndrome (POTS)    Preterm uterine contractions in third trimester, antepartum 11/20/2014   Scarlet fever    hx of   Past Surgical History:  Procedure Laterality Date   CARDIOPULMONARY EXERCISE TEST (CPX)  02/13/2021   Normal functional capacity.  No cardiopulmonary abnormalities.  No evidence of any exercise-induced bronchospasm   CESAREAN SECTION N/A 11/27/2014   Procedure: CESAREAN SECTION;  Surgeon: Olivia Mackie, MD;  Location: [facility];  Service: Obstetrics;  Laterality: N/A;   COLONOSCOPY     CRYOABLATION     LAPAROSCOPIC NISSEN FUNDOPLICATION  2004   polyp     removal  2009   Sphenopalatine Ganglion Block  03/28/2022   TRANSTHORACIC ECHOCARDIOGRAM  02/10/2021   EF 60 to 65%.  No R WMA.  GR 1 DD?Marland Kitchen  Normal RV size and function.  Normal aortic valve.  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   TYMPANOSTOMY TUBE PLACEMENT     UPPER GASTROINTESTINAL ENDOSCOPY     WISDOM TOOTH EXTRACTION     Social History   Socioeconomic History   Marital status: Married    Spouse name: Jill Alexanders   Number of children: 3   Years of education: Not on file   Highest education level: Not on file  Occupational History   Not on file  Tobacco Use   Smoking status: Never   Smokeless tobacco: Never  Vaping Use   Vaping status: Never Used  Substance and Sexual Activity   Alcohol use: Yes    Comment: occ   Drug use: No   Sexual activity: Yes    Birth control/protection: None  Other Topics Concern   Not on file  Social History Narrative   She is married, mother of 3 children ages 32, 53 and 1.   Social Drivers of Corporate investment banker Strain: Not on file  Food Insecurity: Not on file  Transportation Needs: Not on file  Physical Activity: Not on file  Stress: Not on file  Social Connections: Not on file   Family History  Problem Relation Age of  Onset   Cancer Mother    CAD Mother    Hypertension Mother    Hypertension Father    CAD Father 28       Followed by Dr. Herbie Baltimore; ~CABG @ age ~49   Cancer Maternal Grandmother    Diabetes Maternal Grandmother    Diabetes type II Maternal Grandmother    Colon cancer Maternal Grandfather    Breast cancer Paternal Grandmother    Stroke Paternal Grandmother    Esophageal cancer Neg Hx    Rectal cancer Neg Hx    Stomach cancer Neg Hx    Allergies  Allergen Reactions   Codeine Nausea And Vomiting   Cottonseed Oil Other (See Comments)   Gluten Meal Other (See Comments)    Unknown   Nickel Dermatitis, Itching and Rash   Current Outpatient Medications  Medication Sig Dispense Refill   albuterol  (VENTOLIN HFA) 108 (90 Base) MCG/ACT inhaler Inhale into the lungs as needed.     botulinum toxin Type A (BOTOX) 200 units injection Provider to inject 155 units into the muscles of the head and neck every 3 months. Discard remainder 1 each 2   methocarbamol (ROBAXIN) 500 MG tablet Take 1,000 mg by mouth as needed.     metroNIDAZOLE (METROCREAM) 0.75 % cream Apply topically.     mometasone (ELOCON) 0.1 % cream Apply 1 Application topically daily.     MYDAYIS 50 MG CP24 Take 1 capsule by mouth every morning.     naratriptan (AMERGE) 2.5 MG tablet Take 1 tablet (2.5 mg total) by mouth as needed for migraine. Take one (1) tablet at onset of headache; if returns or does not resolve, may repeat after 4 hours; do not exceed five (5) mg in 24 hours. 10 tablet 12   pantoprazole (PROTONIX) 40 MG tablet TAKE 1 TABLET BY MOUTH TWICE A DAY 180 tablet 0   Ubrogepant (UBRELVY) 100 MG TABS Take 1 tablet (100 mg total) by mouth as needed (for migraine). May repeat a dose in 2 hours if headache persists. Max dose 2 pills in 24 hours 16 tablet 11   No current facility-administered medications for this visit.   No results found.  Review of Systems:   A ROS was performed including pertinent positives and negatives as documented in the HPI.  Physical Exam :   Constitutional: NAD and appears stated age Neurological: Alert and oriented Psych: Appropriate affect and cooperative unknown if currently breastfeeding.   Comprehensive Musculoskeletal Exam:    Left hip with tenderness about the femoral acetabular joint.  Positive FADIR, negative FABER.  Distal neurosensory exam is intact.  Good abduction strength.  30 degrees internal rotation of the hip with pain 45 of external without pain   Imaging:   Xray (3 views left hip): Normal  MRI (left hip): Anterior superior labral tear   I personally reviewed and interpreted the radiographs.   Assessment and Plan:   43 y.o. female with evidence of left hip  instability in the setting of a labral tear.  At today's visit I did ultimately discuss surgical intervention including hip arthroscopy with labral repair given the fact that she has trialed physical therapy as well as an injection.  She has not gone permanent relief of this.  We did discuss her recovery timeframe as well as the risks limitations for surgery.  After discussion of this she has elected to proceed  -plan for left hip arthroscopy with labral repair   After a lengthy discussion of treatment  options, including risks, benefits, alternatives, complications of surgical and nonsurgical conservative options, the patient elected surgical repair.   The patient  is aware of the material risks  and complications including, but not limited to injury to adjacent structures, neurovascular injury, infection, numbness, bleeding, implant failure, thermal burns, stiffness, persistent pain, failure to heal, disease transmission from allograft, need for further surgery, dislocation, anesthetic risks, blood clots, risks of death,and others. The probabilities of surgical success and failure discussed with patient given their particular co-morbidities.The time and nature of expected rehabilitation and recovery was discussed.The patient's questions were all answered preoperatively.  No barriers to understanding were noted. I explained the natural history of the disease process and Rx rationale.  I explained to the patient what I considered to be reasonable expectations given their personal situation.  The final treatment plan was arrived at through a shared patient decision making process model.    I personally saw and evaluated the patient, and participated in the management and treatment plan.  Huel Cote, MD Attending Physician, Orthopedic Surgery  This document was dictated using Dragon voice recognition software. A reasonable attempt at proof reading has been made to minimize errors.

## 2023-08-08 ENCOUNTER — Telehealth: Payer: Self-pay

## 2023-08-08 NOTE — Telephone Encounter (Signed)
   Name: GOWRI SUCHAN  DOB: 1981-01-01  MRN: [id number]  Primary Cardiologist: Randene Bustard, MD  Chart reviewed as part of pre-operative protocol coverage. Because of Eisley Barber Oetken's past medical history and time since last visit, she will require a follow-up in-office visit in order to better assess preoperative cardiovascular risk.  Pre-op covering staff: - Please schedule appointment and call patient to inform them. If patient already had an upcoming appointment within acceptable timeframe, please add "pre-op clearance" to the appointment notes so provider is aware. - Please contact requesting surgeon's office via preferred method (i.e, phone, fax) to inform them of need for appointment prior to surgery.   Francene Ing, Retha Cast, NP  08/08/2023, 11:29 AM

## 2023-08-08 NOTE — Telephone Encounter (Signed)
   Pre-operative Risk Assessment    Patient Name: Cheryl Lara  DOB: 12-Apr-1981 MRN: [id number]   Date of last office visit: 04/03/22 STEVEN KLEIN, MD Date of next office visit: NONE   Request for Surgical Clearance    Procedure:   LEFT HIP ARTHROSCOPY WITH LABRAL REPAIR  Date of Surgery:  Clearance TBD                                Surgeon:  NOT INDICATED  Surgeon's Group or Practice Name:  [facility] Phone number:  [phone] Fax number:  [phone] ATTN: APRIL B   Type of Clearance Requested:   - Medical    Type of Anesthesia:  General    Additional requests/questions:    SignedCollin Deal   08/08/2023, 11:17 AM

## 2023-08-08 NOTE — Telephone Encounter (Signed)
 Preop clearance appt now scheduled

## 2023-08-11 NOTE — Progress Notes (Deleted)
 Cardiology Office Note:    Date:  08/11/2023   ID:  Cheryl Lara, DOB 11/02/1980, MRN [id number]  PCP:  Angelique Barer, MD   [facility] Providers Cardiologist:  Randene Bustard, MD { Click to update primary MD,subspecialty MD or APP then REFRESH:1}    Referring MD: Angelique Barer, MD   No chief complaint on file. ***  History of Present Illness:    Cheryl Lara is a 43 y.o. 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 She also has IDA, GERD, and mitral valve prolapse. She had a CPX 01/2021 with no cardiopulmonary abnormalities. She was felt to have long COVID symptoms after COVID infection 10/2020. She has had prior LLE venous ablation. She was last seen by Dr. Rodolfo Clan 05/2022 and felt much improved, follow up was PRN.   CT coronary 10/2021 with no CAD, small PFO.  She now presents for preoperative risk evaluation for left hip arthroscopy with labral repair.     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   Dichorionic diamniotic twin pregnancy in third trimester 11/20/2014   GERD (gastroesophageal reflux disease)    HPV (human papilloma virus) infection    Iron  deficiency anemia 11/27/2014   Mitral valve prolapse    Postpartum care following cesarean delivery of Di-Di twins (8/6) 11/27/2014   Postural orthostatic tachycardia syndrome (POTS)    Preterm uterine contractions in third trimester, antepartum 11/20/2014   Scarlet fever    hx of    Past Surgical History:  Procedure Laterality Date   CARDIOPULMONARY EXERCISE TEST (CPX)  02/13/2021   Normal functional capacity.  No cardiopulmonary  abnormalities.  No evidence of any exercise-induced bronchospasm   CESAREAN SECTION N/A 11/27/2014   Procedure: CESAREAN SECTION;  Surgeon: Meriam Stamp, MD;  Location: [facility];  Service: Obstetrics;  Laterality: N/A;   COLONOSCOPY     CRYOABLATION     LAPAROSCOPIC NISSEN FUNDOPLICATION  2004   polyp     removal 2009   Sphenopalatine Ganglion Block  03/28/2022   TRANSTHORACIC ECHOCARDIOGRAM  02/10/2021   EF 60 to 65%.  No R WMA.  GR 1 DD?Aaron Aas  Normal RV size and function.  Normal aortic valve.  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   TYMPANOSTOMY TUBE PLACEMENT     UPPER GASTROINTESTINAL ENDOSCOPY     WISDOM TOOTH EXTRACTION      Current Medications: No outpatient medications have been marked as taking for the 08/23/23 encounter (Appointment) with Lamond Pilot, PA.     Allergies:   Codeine, Cottonseed oil, Gluten meal, and Nickel   Social History   Socioeconomic History   Marital status: Married    Spouse name: Austine Lefort   Number of children: 3   Years of education: Not on file   Highest education level: Not on file  Occupational History   Not on file  Tobacco Use   Smoking status: Never   Smokeless tobacco: Never  Vaping Use   Vaping status: Never Used  Substance and Sexual Activity   Alcohol use: Yes  Comment: occ   Drug use: No   Sexual activity: Yes    Birth control/protection: None  Other Topics Concern   Not on file  Social History Narrative   She is married, mother of 3 children ages 1, 22 and 29.   Social Drivers of Corporate investment banker Strain: Not on file  Food Insecurity: Not on file  Transportation Needs: Not on file  Physical Activity: Not on file  Stress: Not on file  Social Connections: Not on file     Family History: The patient's ***family history includes Breast cancer in her paternal grandmother; CAD in her mother; CAD (age of onset: 28) in her father; Cancer in her maternal grandmother and mother; Colon cancer  in her maternal grandfather; Diabetes in her maternal grandmother; Diabetes type II in her maternal grandmother; Hypertension in her father and mother; Stroke in her paternal grandmother. There is no history of Esophageal cancer, Rectal cancer, or Stomach cancer.  ROS:   Please see the history of present illness.    *** All other systems reviewed and are negative.  EKGs/Labs/Other Studies Reviewed:    The following studies were reviewed today: ***      Recent Labs: No results found for requested labs within last 365 days.  Recent Lipid Panel No results found for: "CHOL", "TRIG", "HDL", "CHOLHDL", "VLDL", "LDLCALC", "LDLDIRECT"   Risk Assessment/Calculations:   {Does this patient have ATRIAL FIBRILLATION?[phone]}  No BP recorded.  {Refresh Note OR Click here to enter BP  :1}***         Physical Exam:    VS:  There were no vitals taken for this visit.    Wt Readings from Last 3 Encounters:  12/18/22 173 lb (78.5 kg)  04/03/22 161 lb (73 kg)  12/06/21 160 lb 6.4 oz (72.8 kg)     GEN: *** Well nourished, well developed in no acute distress HEENT: Normal NECK: No JVD; No carotid bruits LYMPHATICS: No lymphadenopathy CARDIAC: ***RRR, no murmurs, rubs, gallops RESPIRATORY:  Clear to auscultation without rales, wheezing or rhonchi  ABDOMEN: Soft, non-tender, non-distended MUSCULOSKELETAL:  No edema; No deformity  SKIN: Warm and dry NEUROLOGIC:  Alert and oriented x 3 PSYCHIATRIC:  Normal affect   ASSESSMENT:    No diagnosis found. PLAN:    In order of problems listed above:  ***      {Are you ordering a CV Procedure (e.g. stress test, cath, DCCV, TEE, etc)?   Press F2        :[id number]}    Medication Adjustments/Labs and Tests Ordered: Current medicines are reviewed at length with the patient today.  Concerns regarding medicines are outlined above.  No orders of the defined types were placed in this encounter.  No orders of the defined types were placed in  this encounter.   There are no Patient Instructions on file for this visit.   Signed, Lamond Pilot, [place]  08/11/2023 11:57 AM    [facility]

## 2023-08-20 ENCOUNTER — Encounter: Payer: Self-pay | Admitting: Sports Medicine

## 2023-08-23 ENCOUNTER — Ambulatory Visit: Admitting: Physician Assistant

## 2023-08-26 ENCOUNTER — Other Ambulatory Visit: Payer: Self-pay | Admitting: Sports Medicine

## 2023-08-26 DIAGNOSIS — G8929 Other chronic pain: Secondary | ICD-10-CM

## 2023-09-09 ENCOUNTER — Other Ambulatory Visit: Payer: Self-pay

## 2023-09-09 ENCOUNTER — Ambulatory Visit: Admitting: Physical Medicine and Rehabilitation

## 2023-09-09 ENCOUNTER — Encounter: Payer: Self-pay | Admitting: Physical Medicine and Rehabilitation

## 2023-09-09 VITALS — BP 126/82 | HR 99

## 2023-09-09 DIAGNOSIS — M5416 Radiculopathy, lumbar region: Secondary | ICD-10-CM

## 2023-09-09 MED ORDER — METHYLPREDNISOLONE ACETATE 40 MG/ML IJ SUSP
40.0000 mg | Freq: Once | INTRAMUSCULAR | Status: AC
  Administered 2023-09-09: 40 mg

## 2023-09-09 NOTE — Patient Instructions (Signed)

## 2023-09-09 NOTE — Progress Notes (Unsigned)
 Pain Scale   Average Pain 7  Back pain when walking, sitting and standing.   Transitioning is the worse from one position to another. Back pain radiates into both legs but mostly the left leg .  Only goes into the right buttock.  Some numbness/tingling in the left leg. Robaxin  Physical therapy-dry needling   +Driver, -BT, -Dye Allergies.

## 2023-09-17 ENCOUNTER — Other Ambulatory Visit (HOSPITAL_COMMUNITY): Payer: Self-pay

## 2023-09-17 NOTE — Progress Notes (Signed)
 Cheryl Lara - 42 y.o. female MRN [id number]  Date of birth: 05-16-1980  Office Visit Note: Visit Date: 09/09/2023 PCP: Angelique Barer, MD Referred by: Shauna Del, DO  Subjective: Chief Complaint  Patient presents with   Lower Back - Pain   HPI:  Cheryl Lara is a 43 y.o. female who comes in today at the request of Dr. Shauna Del for planned Left L4-5 Lumbar Interlaminar epidural steroid injection with fluoroscopic guidance.  The patient has failed conservative care including home exercise, medications, time and activity modification.  This injection will be diagnostic and hopefully therapeutic.  Please see requesting physician notes for further details and justification.   ROS Otherwise per HPI.  Assessment & Plan: Visit Diagnoses:    ICD-10-CM   1. Lumbar radiculopathy  M54.16 XR C-ARM NO REPORT    Epidural Steroid injection    methylPREDNISolone  acetate (DEPO-MEDROL ) injection 40 mg      Plan: No additional findings.   Meds & Orders:  Meds ordered this encounter  Medications   methylPREDNISolone  acetate (DEPO-MEDROL ) injection 40 mg    Orders Placed This Encounter  Procedures   XR C-ARM NO REPORT   Epidural Steroid injection    Follow-up: Return for visit to requesting provider as needed.   Procedures: No procedures performed  Lumbar Epidural Steroid Injection - Interlaminar Approach with Fluoroscopic Guidance  Patient: Cheryl Lara      Date of Birth: [birth year] MRN: [id number] PCP: Angelique Barer, MD      Visit Date: 09/09/2023   Universal Protocol:     Consent Given By: the patient  Position: PRONE  Additional Comments: Vital signs were monitored before and after the procedure. Patient was prepped and draped in the usual sterile fashion. The correct patient, procedure, and site was verified.   Injection Procedure Details:   Procedure diagnoses: Lumbar radiculopathy [M54.16]   Meds Administered:  Meds ordered this encounter  Medications    methylPREDNISolone  acetate (DEPO-MEDROL ) injection 40 mg     Laterality: Left  Location/Site:  L4-5  Needle: 3.5 in., 20 ga. Tuohy  Needle Placement: Paramedian epidural  Findings:   -Comments: Excellent flow of contrast into the epidural space.  Procedure Details: Using a paramedian approach from the side mentioned above, the region overlying the inferior lamina was localized under fluoroscopic visualization and the soft tissues overlying this structure were infiltrated with 4 ml. of 1% Lidocaine  without Epinephrine. The Tuohy needle was inserted into the epidural space using a paramedian approach.   The epidural space was localized using loss of resistance along with counter oblique bi-planar fluoroscopic views.  After negative aspirate for air, blood, and CSF, a 2 ml. volume of Isovue -250 was injected into the epidural space and the flow of contrast was observed. Radiographs were obtained for documentation purposes.    The injectate was administered into the level noted above.   Additional Comments:  The patient tolerated the procedure well Dressing: 2 x 2 sterile gauze and Band-Aid    Post-procedure details: Patient was observed during the procedure. Post-procedure instructions were reviewed.  Patient left the clinic in stable condition.   Clinical History: MR LUMBAR SPINE WITHOUT IV CONTRAST   COMPARISON: Lumbar x-ray 06/15/2023   CLINICAL HISTORY: Low back pain. Left hip pain.   TECHNIQUE: SAG T2, SAG T1, SAG STIR, AX T2, AX T1 without IV contrast.   FINDINGS: There is normal alignment of the lumbar spine. Mild disc desiccation is seen in the lower thoracic spine and lower  lumbar spine. Moderate facet arthrosis is demonstrated with L4 -5 with mild facet edema. There is no vertebral body height loss, subluxation or marrow replacing process. The sacrum and SI joints are unremarkable so far as visualized. Conus and cauda equina are unremarkable.   T12-L1: There  is no focal disc protrusion, foraminal or spinal stenosis.   L1-2: There is no focal disc protrusion, foraminal or spinal stenosis.   L2-3: There is no focal disc protrusion, foraminal or spinal stenosis.   L3-4: Disc desiccation and mild facet arthrosis is identified. No significant foraminal or spinal stenosis is identified.   L4-5: Mild broad-based bulge effacing the thecal sac without descending nerve roots in the lateral recess, right slightly greater than left. Moderate facet arthrosis, left slightly greater than right. There is mild caudal foraminal narrowing abutting the exiting L4 nerve root without impingement.   L5-S1: There is no focal disc protrusion, foraminal or spinal stenosis. Mild facet arthrosis.   The retroperitoneal structures demonstrate no significant abnormality.   IMPRESSION: Degenerative spondylosis most notably at L4-5. There is a broad-based bulge effacing the ventral thecal sac crowding of descending nerve roots in the lateral recesses, right greater than left. There is mild-to-moderate bilateral facet arthrosis with mild bilateral facet edema. There is caudal frontal narrowing, right slightly greater than left. Correlation for mild L4 radiculopathy.   Electronically signed by: Adrien Alberta MD 08/17/2023     Objective:  VS:  HT:    WT:   BMI:     BP:126/82  HR:99bpm  TEMP: ( )  RESP:  Physical Exam Vitals and nursing note reviewed.  Constitutional:      General: She is not in acute distress.    Appearance: Normal appearance. She is not ill-appearing.  HENT:     Head: Normocephalic and atraumatic.     Right Ear: External ear normal.     Left Ear: External ear normal.  Eyes:     Extraocular Movements: Extraocular movements intact.  Cardiovascular:     Rate and Rhythm: Normal rate.     Pulses: Normal pulses.  Pulmonary:     Effort: Pulmonary effort is normal. No respiratory distress.  Abdominal:     General: There is no distension.      Palpations: Abdomen is soft.  Musculoskeletal:        General: Tenderness present.     Cervical back: Neck supple.     Right lower leg: No edema.     Left lower leg: No edema.     Comments: Patient has good distal strength with no pain over the greater trochanters.  No clonus or focal weakness.  Skin:    Findings: No erythema, lesion or rash.  Neurological:     General: No focal deficit present.     Mental Status: She is alert and oriented to person, place, and time.     Sensory: No sensory deficit.     Motor: No weakness or abnormal muscle tone.     Coordination: Coordination normal.  Psychiatric:        Mood and Affect: Mood normal.        Behavior: Behavior normal.      Imaging: No results found.

## 2023-09-17 NOTE — Procedures (Signed)
 Lumbar Epidural Steroid Injection - Interlaminar Approach with Fluoroscopic Guidance  Patient: Cheryl Lara      Date of Birth: [birth year] MRN: [id number] PCP: Angelique Barer, MD      Visit Date: 09/09/2023   Universal Protocol:     Consent Given By: the patient  Position: PRONE  Additional Comments: Vital signs were monitored before and after the procedure. Patient was prepped and draped in the usual sterile fashion. The correct patient, procedure, and site was verified.   Injection Procedure Details:   Procedure diagnoses: Lumbar radiculopathy [M54.16]   Meds Administered:  Meds ordered this encounter  Medications   methylPREDNISolone  acetate (DEPO-MEDROL ) injection 40 mg     Laterality: Left  Location/Site:  L4-5  Needle: 3.5 in., 20 ga. Tuohy  Needle Placement: Paramedian epidural  Findings:   -Comments: Excellent flow of contrast into the epidural space.  Procedure Details: Using a paramedian approach from the side mentioned above, the region overlying the inferior lamina was localized under fluoroscopic visualization and the soft tissues overlying this structure were infiltrated with 4 ml. of 1% Lidocaine  without Epinephrine. The Tuohy needle was inserted into the epidural space using a paramedian approach.   The epidural space was localized using loss of resistance along with counter oblique bi-planar fluoroscopic views.  After negative aspirate for air, blood, and CSF, a 2 ml. volume of Isovue -250 was injected into the epidural space and the flow of contrast was observed. Radiographs were obtained for documentation purposes.    The injectate was administered into the level noted above.   Additional Comments:  The patient tolerated the procedure well Dressing: 2 x 2 sterile gauze and Band-Aid    Post-procedure details: Patient was observed during the procedure. Post-procedure instructions were reviewed.  Patient left the clinic in stable condition.

## 2023-09-20 ENCOUNTER — Other Ambulatory Visit: Payer: Self-pay | Admitting: Pharmacy Technician

## 2023-09-20 ENCOUNTER — Other Ambulatory Visit: Payer: Self-pay

## 2023-09-20 ENCOUNTER — Other Ambulatory Visit (HOSPITAL_COMMUNITY): Payer: Self-pay

## 2023-09-20 NOTE — Progress Notes (Signed)
 Specialty Pharmacy Refill Coordination Note  Cheryl Lara is a 43 y.o. female contacted today regarding refills of specialty medication(s) OnabotulinumtoxinA  (BOTOX )   Patient requested Courier to Provider Office   Delivery date: 10/02/23   Verified address: [facility] [address]   Medication will be filled on 10/01/23.

## 2023-10-01 ENCOUNTER — Other Ambulatory Visit (HOSPITAL_COMMUNITY): Payer: Self-pay

## 2023-10-01 ENCOUNTER — Other Ambulatory Visit: Payer: Self-pay

## 2023-10-07 ENCOUNTER — Ambulatory Visit: Admitting: Adult Health

## 2023-10-07 VITALS — BP 110/65 | HR 87

## 2023-10-07 DIAGNOSIS — G43719 Chronic migraine without aura, intractable, without status migrainosus: Secondary | ICD-10-CM

## 2023-10-07 MED ORDER — ONABOTULINUMTOXINA 200 UNITS IJ SOLR
155.0000 [IU] | Freq: Once | INTRAMUSCULAR | Status: AC
  Administered 2023-10-07: 155 [IU] via INTRAMUSCULAR

## 2023-10-07 NOTE — Progress Notes (Signed)
 Update 10/07/2023 JM: Returns for repeat Botox .  Prior injection on 07/15/2023.  Reports continued benefit with botox , about 4 migraines per month which is greater than 50% improvement as previously experiencing every other day.  She can have some increased migraines a few weeks prior to next injection.  She did receive lumbar steroid injection about 1 month ago which did help migraines some. Use of Ubrelvy  with benefit but at times will only use Robaxin  and lay down with resolution.  Continued benefit with masseter injection for jaw pain which triggered migraine headaches. Tolerated procedure well today.  Return in 3 months for repeat injection.        Consent Form Botulism Toxin Injection For Chronic Migraine    Reviewed orally with patient, additionally signature is on file:  Botulism toxin has been approved by the Federal drug administration for treatment of chronic migraine. Botulism toxin does not cure chronic migraine and it may not be effective in some patients.  The administration of botulism toxin is accomplished by injecting a small amount of toxin into the muscles of the neck and head. Dosage must be titrated for each individual. Any benefits resulting from botulism toxin tend to wear off after 3 months with a repeat injection required if benefit is to be maintained. Injections are usually done every 3-4 months with maximum effect peak achieved by about 2 or 3 weeks. Botulism toxin is expensive and you should be sure of what costs you will incur resulting from the injection.  The side effects of botulism toxin use for chronic migraine may include:   -Transient, and usually mild, facial weakness with facial injections  -Transient, and usually mild, head or neck weakness with head/neck injections  -Reduction or loss of forehead facial animation due to forehead muscle weakness  -Eyelid drooping  -Dry eye  -Pain at the site of injection or bruising at the site of  injection  -Double vision  -Potential unknown long term risks   Contraindications: You should not have Botox  if you are pregnant, nursing, allergic to albumin, have an infection, skin condition, or muscle weakness at the site of the injection, or have myasthenia gravis, Lambert-Eaton syndrome, or ALS.  It is also possible that as with any injection, there may be an allergic reaction or no effect from the medication. Reduced effectiveness after repeated injections is sometimes seen and rarely infection at the injection site may occur. All care will be taken to prevent these side effects. If therapy is given over a long time, atrophy and wasting in the muscle injected may occur. Occasionally the patient's become refractory to treatment because they develop antibodies to the toxin. In this event, therapy needs to be modified.  I have read the above information and consent to the administration of botulism toxin.    BOTOX  PROCEDURE NOTE FOR MIGRAINE HEADACHE  Contraindications and precautions discussed with patient(above). Aseptic procedure was observed and patient tolerated procedure. Procedure performed by Johny Nap, AGNP-BC.   The condition has existed for more than 6 months, and pt does not have a diagnosis of ALS, Myasthenia Gravis or Lambert-Eaton Syndrome.  Risks and benefits of injections discussed and pt agrees to proceed with the procedure.  Written consent obtained  These injections are medically necessary. Pt  receives good benefits from these injections. These injections do not cause sedations or hallucinations which the oral therapies may cause.   Description of procedure:  The patient was placed in a sitting position. The standard protocol was  used for Botox  as follows, with 5 units of Botox  injected at each site:  -Procerus muscle, midline injection  -Corrugator muscle, bilateral injection  -Frontalis muscle, bilateral injection, with 2 sites each side, medial injection  was performed in the upper one third of the frontalis muscle, in the region vertical from the medial inferior edge of the superior orbital rim. The lateral injection was again in the upper one third of the forehead vertically above the lateral limbus of the cornea, 1.5 cm lateral to the medial injection site.  -Temporalis muscle injection, 4 sites, bilaterally. The first injection was 3 cm above the tragus of the ear, second injection site was 1.5 cm to 3 cm up from the first injection site in line with the tragus of the ear. The third injection site was 1.5-3 cm forward between the first 2 injection sites. The fourth injection site was 1.5 cm posterior to the second injection site. 5th site laterally in the temporalis  muscleat the level of the outer canthus.  -Occipitalis muscle injection, 3 sites, bilaterally. The first injection was done one half way between the occipital protuberance and the tip of the mastoid process behind the ear. The second injection site was done lateral and superior to the first, 1 fingerbreadth from the first injection. The third injection site was 1 fingerbreadth superiorly and medially from the first injection site.  -Cervical paraspinal muscle injection, 2 sites, bilaterally. The first injection site was 1 cm from the midline of the cervical spine, 3 cm inferior to the lower border of the occipital protuberance. The second injection site was 1.5 cm superiorly and laterally to the first injection site.  -Trapezius muscle injection was performed at 3 sites, bilaterally. The first injection site was in the upper trapezius muscle halfway between the inflection point of the neck, and the acromion. The second injection site was one half way between the acromion and the first injection site. The third injection was done between the first injection site and the inflection point of the neck.  -Masseter muscle injection was performed at 1 site, bilaterally.       A total of 200  units of Botox  was prepared, 165 units of Botox  was injected as documented above, any Botox  not injected was wasted. The patient tolerated the procedure well, there were no complications of the above procedure.   Johny Nap, AGNP-BC  [facility] [address] [place], [place] [address]  Phone [phone] Fax [phone] Note: This document was prepared with digital dictation and possible smart phrase technology. Any transcriptional errors that result from this process are unintentional.

## 2023-10-07 NOTE — Progress Notes (Signed)
 Botox - 200 units x 1 vial Lot: [id number] Expiration: 01/2026 NDC: 4540-9811-91   Bacteriostatic 0.9% Sodium Chloride - 4mL total [id number] Expiration: 12/23/23 NDC: 3086-5784-69   Dx: G29.528 SP   Witnessed by: Arville Laughter

## 2023-11-01 ENCOUNTER — Telehealth: Payer: Self-pay

## 2023-11-01 ENCOUNTER — Other Ambulatory Visit (HOSPITAL_COMMUNITY): Payer: Self-pay

## 2023-11-01 NOTE — Telephone Encounter (Signed)
 Pharmacy Patient Advocate Encounter   Received notification from CoverMyMeds that prior authorization for Ubrelvy  100MG  tablets is required/requested.   Insurance verification completed.   The patient is insured through [facility] .   Per test claim: PA required; PA submitted to above mentioned insurance via CoverMyMeds Key/confirmation #/EOC [id number] Status is pending

## 2023-11-01 NOTE — Telephone Encounter (Signed)
 Pharmacy Patient Advocate Encounter  Received notification from [facility] that Prior Authorization for Ubrelvy  100MG  tablets has been APPROVED from 11/01/2023 to 10/31/2024. Ran test claim, Copay is $0. This test claim was processed through [facility]- copay amounts may vary at other pharmacies due to pharmacy/plan contracts, or as the patient moves through the different stages of their insurance plan.   PA #/Case ID/Reference #: PA Case ID #: [id number]

## 2023-11-19 ENCOUNTER — Other Ambulatory Visit: Payer: Self-pay | Admitting: Physical Medicine and Rehabilitation

## 2023-11-19 ENCOUNTER — Encounter: Payer: Self-pay | Admitting: Gastroenterology

## 2023-11-19 DIAGNOSIS — M5416 Radiculopathy, lumbar region: Secondary | ICD-10-CM

## 2023-12-09 ENCOUNTER — Other Ambulatory Visit: Payer: Self-pay

## 2023-12-09 ENCOUNTER — Ambulatory Visit: Admitting: Physical Medicine and Rehabilitation

## 2023-12-09 VITALS — BP 106/60 | HR 80

## 2023-12-09 DIAGNOSIS — M5416 Radiculopathy, lumbar region: Secondary | ICD-10-CM | POA: Diagnosis not present

## 2023-12-09 MED ORDER — METHYLPREDNISOLONE ACETATE 40 MG/ML IJ SUSP
40.0000 mg | Freq: Once | INTRAMUSCULAR | Status: AC
  Administered 2023-12-09: 40 mg

## 2023-12-09 NOTE — Progress Notes (Signed)
 Pain Scale   Average Pain 5 Patient advised she has lower back pain radiating to her left leg, pain increases when she twists her back , increasing standing. Pain decreases when she sits.        +Driver, -BT, -Dye Allergies.

## 2023-12-09 NOTE — Patient Instructions (Signed)

## 2023-12-15 NOTE — Procedures (Signed)
 Lumbar Epidural Steroid Injection - Interlaminar Approach with Fluoroscopic Guidance  Patient: Cheryl Lara      Date of Birth: 1981/01/29 MRN: [id number] PCP: Jama Chow, MD      Visit Date: 12/09/2023   Universal Protocol:     Consent Given By: the patient  Position: PRONE  Additional Comments: Vital signs were monitored before and after the procedure. Patient was prepped and draped in the usual sterile fashion. The correct patient, procedure, and site was verified.   Injection Procedure Details:   Procedure diagnoses: Lumbar radiculopathy [M54.16]   Meds Administered:  Meds ordered this encounter  Medications   methylPREDNISolone  acetate (DEPO-MEDROL ) injection 40 mg     Laterality: Left  Location/Site:  L4-5  Needle: 3.5 in., 20 ga. Tuohy  Needle Placement: Paramedian epidural  Findings:   -Comments: Excellent flow of contrast into the epidural space.  Procedure Details: Using a paramedian approach from the side mentioned above, the region overlying the inferior lamina was localized under fluoroscopic visualization and the soft tissues overlying this structure were infiltrated with 4 ml. of 1% Lidocaine  without Epinephrine. The Tuohy needle was inserted into the epidural space using a paramedian approach.   The epidural space was localized using loss of resistance along with counter oblique bi-planar fluoroscopic views.  After negative aspirate for air, blood, and CSF, a 2 ml. volume of Isovue -250 was injected into the epidural space and the flow of contrast was observed. Radiographs were obtained for documentation purposes.    The injectate was administered into the level noted above.   Additional Comments:  The patient tolerated the procedure well Dressing: 2 x 2 sterile gauze and Band-Aid    Post-procedure details: Patient was observed during the procedure. Post-procedure instructions were reviewed.  Patient left the clinic in stable condition.

## 2023-12-15 NOTE — Progress Notes (Signed)
 Cheryl Lara - 43 y.o. female MRN [id number]  Date of birth: Sep 01, 1980  Office Visit Note: Visit Date: 12/09/2023 PCP: Jama Chow, MD Referred by: Jama Chow, MD  Subjective: Chief Complaint  Patient presents with   Lower Back - Pain   HPI:  Cheryl Lara is a 43 y.o. female who comes in today for planned repeat Left L4-5  Lumbar Interlaminar epidural steroid injection with fluoroscopic guidance.  The patient has failed conservative care including home exercise, medications, time and activity modification.  This injection will be diagnostic and hopefully therapeutic.  Please see requesting physician notes for further details and justification. Patient received more than 50% pain relief from prior injection.   Referring: Duwaine Pouch, FNP   ROS Otherwise per HPI.  Assessment & Plan: Visit Diagnoses:    ICD-10-CM   1. Lumbar radiculopathy  M54.16 XR C-ARM NO REPORT    Epidural Steroid injection    methylPREDNISolone  acetate (DEPO-MEDROL ) injection 40 mg      Plan: No additional findings.   Meds & Orders:  Meds ordered this encounter  Medications   methylPREDNISolone  acetate (DEPO-MEDROL ) injection 40 mg    Orders Placed This Encounter  Procedures   XR C-ARM NO REPORT   Epidural Steroid injection    Follow-up: Return for visit to requesting provider as needed.   Procedures: No procedures performed  Lumbar Epidural Steroid Injection - Interlaminar Approach with Fluoroscopic Guidance  Patient: Cheryl Lara      Date of Birth: September 22, 1980 MRN: [id number] PCP: Jama Chow, MD      Visit Date: 12/09/2023   Universal Protocol:     Consent Given By: the patient  Position: PRONE  Additional Comments: Vital signs were monitored before and after the procedure. Patient was prepped and draped in the usual sterile fashion. The correct patient, procedure, and site was verified.   Injection Procedure Details:   Procedure diagnoses: Lumbar radiculopathy [M54.16]    Meds Administered:  Meds ordered this encounter  Medications   methylPREDNISolone  acetate (DEPO-MEDROL ) injection 40 mg     Laterality: Left  Location/Site:  L4-5  Needle: 3.5 in., 20 ga. Tuohy  Needle Placement: Paramedian epidural  Findings:   -Comments: Excellent flow of contrast into the epidural space.  Procedure Details: Using a paramedian approach from the side mentioned above, the region overlying the inferior lamina was localized under fluoroscopic visualization and the soft tissues overlying this structure were infiltrated with 4 ml. of 1% Lidocaine  without Epinephrine. The Tuohy needle was inserted into the epidural space using a paramedian approach.   The epidural space was localized using loss of resistance along with counter oblique bi-planar fluoroscopic views.  After negative aspirate for air, blood, and CSF, a 2 ml. volume of Isovue -250 was injected into the epidural space and the flow of contrast was observed. Radiographs were obtained for documentation purposes.    The injectate was administered into the level noted above.   Additional Comments:  The patient tolerated the procedure well Dressing: 2 x 2 sterile gauze and Band-Aid    Post-procedure details: Patient was observed during the procedure. Post-procedure instructions were reviewed.  Patient left the clinic in stable condition.   Clinical History: MR LUMBAR SPINE WITHOUT IV CONTRAST   COMPARISON: Lumbar x-ray 06/15/2023   CLINICAL HISTORY: Low back pain. Left hip pain.   TECHNIQUE: SAG T2, SAG T1, SAG STIR, AX T2, AX T1 without IV contrast.   FINDINGS: There is normal alignment of the lumbar spine. Mild  disc desiccation is seen in the lower thoracic spine and lower lumbar spine. Moderate facet arthrosis is demonstrated with L4 -5 with mild facet edema. There is no vertebral body height loss, subluxation or marrow replacing process. The sacrum and SI joints are unremarkable so far as  visualized. Conus and cauda equina are unremarkable.   T12-L1: There is no focal disc protrusion, foraminal or spinal stenosis.   L1-2: There is no focal disc protrusion, foraminal or spinal stenosis.   L2-3: There is no focal disc protrusion, foraminal or spinal stenosis.   L3-4: Disc desiccation and mild facet arthrosis is identified. No significant foraminal or spinal stenosis is identified.   L4-5: Mild broad-based bulge effacing the thecal sac without descending nerve roots in the lateral recess, right slightly greater than left. Moderate facet arthrosis, left slightly greater than right. There is mild caudal foraminal narrowing abutting the exiting L4 nerve root without impingement.   L5-S1: There is no focal disc protrusion, foraminal or spinal stenosis. Mild facet arthrosis.   The retroperitoneal structures demonstrate no significant abnormality.   IMPRESSION: Degenerative spondylosis most notably at L4-5. There is a broad-based bulge effacing the ventral thecal sac crowding of descending nerve roots in the lateral recesses, right greater than left. There is mild-to-moderate bilateral facet arthrosis with mild bilateral facet edema. There is caudal frontal narrowing, right slightly greater than left. Correlation for mild L4 radiculopathy.   Electronically signed by: Norleen Satchel MD 08/17/2023     Objective:  VS:  HT:    WT:   BMI:     BP:106/60  HR:80bpm  TEMP: ( )  RESP:  Physical Exam Vitals and nursing note reviewed.  Constitutional:      General: She is not in acute distress.    Appearance: Normal appearance. She is not ill-appearing.  HENT:     Head: Normocephalic and atraumatic.     Right Ear: External ear normal.     Left Ear: External ear normal.  Eyes:     Extraocular Movements: Extraocular movements intact.  Cardiovascular:     Rate and Rhythm: Normal rate.     Pulses: Normal pulses.  Pulmonary:     Effort: Pulmonary effort is normal. No  respiratory distress.  Abdominal:     General: There is no distension.     Palpations: Abdomen is soft.  Musculoskeletal:        General: Tenderness present.     Cervical back: Neck supple.     Right lower leg: No edema.     Left lower leg: No edema.     Comments: Patient has good distal strength with no pain over the greater trochanters.  No clonus or focal weakness.  Skin:    Findings: No erythema, lesion or rash.  Neurological:     General: No focal deficit present.     Mental Status: She is alert and oriented to person, place, and time.     Sensory: No sensory deficit.     Motor: No weakness or abnormal muscle tone.     Coordination: Coordination normal.  Psychiatric:        Mood and Affect: Mood normal.        Behavior: Behavior normal.      Imaging: No results found.

## 2023-12-24 ENCOUNTER — Other Ambulatory Visit: Payer: Self-pay

## 2023-12-24 ENCOUNTER — Other Ambulatory Visit: Payer: Self-pay | Admitting: Adult Health

## 2023-12-24 DIAGNOSIS — G43719 Chronic migraine without aura, intractable, without status migrainosus: Secondary | ICD-10-CM

## 2023-12-24 MED ORDER — BOTOX 200 UNITS IJ SOLR
INTRAMUSCULAR | 2 refills | Status: AC
  Filled 2023-12-24: qty 1, 84d supply, fill #0
  Filled 2023-12-24: qty 1, fill #0
  Filled 2024-03-17: qty 1, 84d supply, fill #1

## 2023-12-24 NOTE — Progress Notes (Signed)
 Specialty Pharmacy Refill Coordination Note  Cheryl Lara is a 43 y.o. female assessed today regarding refills of clinic administered specialty medication(s) OnabotulinumtoxinA  (Botox )  Spoke with J.Martin at [facility]  Clinic requested Courier to Provider Office   Delivery date: 12/26/23   Verified address: [facility] [address]   Medication will be filled on 09.03.25.

## 2023-12-30 ENCOUNTER — Ambulatory Visit (INDEPENDENT_AMBULATORY_CARE_PROVIDER_SITE_OTHER): Admitting: Adult Health

## 2023-12-30 VITALS — BP 116/68 | HR 76

## 2023-12-30 DIAGNOSIS — G43719 Chronic migraine without aura, intractable, without status migrainosus: Secondary | ICD-10-CM

## 2023-12-30 MED ORDER — ONABOTULINUMTOXINA 200 UNITS IJ SOLR
155.0000 [IU] | Freq: Once | INTRAMUSCULAR | Status: AC
Start: 2023-12-30 — End: 2023-12-30
  Administered 2023-12-30: 165 [IU] via INTRAMUSCULAR

## 2023-12-30 NOTE — Progress Notes (Signed)
 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

## 2023-12-30 NOTE — Progress Notes (Signed)
 Update 12/30/2023 JM: Returns for repeat Botox .  Prior injection on 10/07/2023.  Reports continued benefit with botox , about 4 migraines per month which is greater than 50% improvement as previously experiencing every other day.  She she can have wearing off a couple weeks prior to next injection.  She has also noted change in hearing which she associates with Botox  wearing off.  She continues to clench and grind her teeth which can trigger migraine headache, reports continued benefit with masseter injections. Use of Ubrelvy  with benefit but at times will only use Robaxin  and lay down with resolution. Tolerated procedure well today.  Return in 3 months for repeat injection.        Consent Form Botulism Toxin Injection For Chronic Migraine    Reviewed orally with patient, additionally signature is on file:  Botulism toxin has been approved by the Federal drug administration for treatment of chronic migraine. Botulism toxin does not cure chronic migraine and it may not be effective in some patients.  The administration of botulism toxin is accomplished by injecting a small amount of toxin into the muscles of the neck and head. Dosage must be titrated for each individual. Any benefits resulting from botulism toxin tend to wear off after 3 months with a repeat injection required if benefit is to be maintained. Injections are usually done every 3-4 months with maximum effect peak achieved by about 2 or 3 weeks. Botulism toxin is expensive and you should be sure of what costs you will incur resulting from the injection.  The side effects of botulism toxin use for chronic migraine may include:   -Transient, and usually mild, facial weakness with facial injections  -Transient, and usually mild, head or neck weakness with head/neck injections  -Reduction or loss of forehead facial animation due to forehead muscle weakness  -Eyelid drooping  -Dry eye  -Pain at the site of injection or bruising  at the site of injection  -Double vision  -Potential unknown long term risks   Contraindications: You should not have Botox  if you are pregnant, nursing, allergic to albumin, have an infection, skin condition, or muscle weakness at the site of the injection, or have myasthenia gravis, Lambert-Eaton syndrome, or ALS.  It is also possible that as with any injection, there may be an allergic reaction or no effect from the medication. Reduced effectiveness after repeated injections is sometimes seen and rarely infection at the injection site may occur. All care will be taken to prevent these side effects. If therapy is given over a long time, atrophy and wasting in the muscle injected may occur. Occasionally the patient's become refractory to treatment because they develop antibodies to the toxin. In this event, therapy needs to be modified.  I have read the above information and consent to the administration of botulism toxin.    BOTOX  PROCEDURE NOTE FOR MIGRAINE HEADACHE  Contraindications and precautions discussed with patient(above). Aseptic procedure was observed and patient tolerated procedure. Procedure performed by Harlene Bogaert, AGNP-BC.   The condition has existed for more than 6 months, and pt does not have a diagnosis of ALS, Myasthenia Gravis or Lambert-Eaton Syndrome.  Risks and benefits of injections discussed and pt agrees to proceed with the procedure.  Written consent obtained  These injections are medically necessary. Pt  receives good benefits from these injections. These injections do not cause sedations or hallucinations which the oral therapies may cause.   Description of procedure:  The patient was placed in a  sitting position. The standard protocol was used for Botox  as follows, with 5 units of Botox  injected at each site:  -Procerus muscle, midline injection  -Corrugator muscle, bilateral injection  -Frontalis muscle, bilateral injection, with 2 sites each side,  medial injection was performed in the upper one third of the frontalis muscle, in the region vertical from the medial inferior edge of the superior orbital rim. The lateral injection was again in the upper one third of the forehead vertically above the lateral limbus of the cornea, 1.5 cm lateral to the medial injection site.  -Temporalis muscle injection, 4 sites, bilaterally. The first injection was 3 cm above the tragus of the ear, second injection site was 1.5 cm to 3 cm up from the first injection site in line with the tragus of the ear. The third injection site was 1.5-3 cm forward between the first 2 injection sites. The fourth injection site was 1.5 cm posterior to the second injection site. 5th site laterally in the temporalis  muscleat the level of the outer canthus.  -Occipitalis muscle injection, 3 sites, bilaterally. The first injection was done one half way between the occipital protuberance and the tip of the mastoid process behind the ear. The second injection site was done lateral and superior to the first, 1 fingerbreadth from the first injection. The third injection site was 1 fingerbreadth superiorly and medially from the first injection site.  -Cervical paraspinal muscle injection, 2 sites, bilaterally. The first injection site was 1 cm from the midline of the cervical spine, 3 cm inferior to the lower border of the occipital protuberance. The second injection site was 1.5 cm superiorly and laterally to the first injection site.  -Trapezius muscle injection was performed at 3 sites, bilaterally. The first injection site was in the upper trapezius muscle halfway between the inflection point of the neck, and the acromion. The second injection site was one half way between the acromion and the first injection site. The third injection was done between the first injection site and the inflection point of the neck.  -Masseter muscle injection was performed at 1 site, bilaterally.        A total of 200 units of Botox  was prepared, 165 units of Botox  was injected as documented above, any Botox  not injected was wasted. The patient tolerated the procedure well, there were no complications of the above procedure.   Harlene Bogaert, AGNP-BC  [facility] [address] [place], [place] [address]  Phone [phone] Fax [phone] Note: This document was prepared with digital dictation and possible smart phrase technology. Any transcriptional errors that result from this process are unintentional.

## 2024-01-02 ENCOUNTER — Encounter: Payer: Self-pay | Admitting: Gastroenterology

## 2024-01-02 ENCOUNTER — Ambulatory Visit: Admitting: Gastroenterology

## 2024-01-02 VITALS — BP 102/72 | HR 67 | Ht 69.0 in | Wt 182.0 lb

## 2024-01-02 DIAGNOSIS — R1013 Epigastric pain: Secondary | ICD-10-CM

## 2024-01-02 DIAGNOSIS — R140 Abdominal distension (gaseous): Secondary | ICD-10-CM

## 2024-01-02 DIAGNOSIS — Z860101 Personal history of adenomatous and serrated colon polyps: Secondary | ICD-10-CM | POA: Diagnosis not present

## 2024-01-02 DIAGNOSIS — R142 Eructation: Secondary | ICD-10-CM

## 2024-01-02 DIAGNOSIS — K219 Gastro-esophageal reflux disease without esophagitis: Secondary | ICD-10-CM | POA: Diagnosis not present

## 2024-01-02 DIAGNOSIS — Z860100 Personal history of colon polyps, unspecified: Secondary | ICD-10-CM

## 2024-01-02 DIAGNOSIS — Z800 Family history of malignant neoplasm of digestive organs: Secondary | ICD-10-CM

## 2024-01-02 MED ORDER — NA SULFATE-K SULFATE-MG SULF 17.5-3.13-1.6 GM/177ML PO SOLN: 1.0000 | Freq: Once | ORAL | 0 refills | Status: AC

## 2024-01-02 NOTE — Progress Notes (Addendum)
 Chief Complaint: GERD Primary GI Doctor:(previously Dr Teressa) Dr. San    HPI:  Patient is a  43  year old female patient with past medical history of GERD,  history of remote Nissen, mitral valve prolapse,  dysautonomia , and asthma, who was self referred to me for a evaluation of GERD. Patient last seen in the GI office in February 2021 by Harlene, [place] for preop examination.  05/2022 Patient seen by cardiologist, reviewed entire note.  Interval History     Patient presents for evaluation of controlled GERD with belching and upper abdominal discomfort. Patient has history of GERD and initially was only taking pantoprazole  40 mg once daily.  Patient states she recently had to increase the dose to twice daily over the course of the last couple of months.  Patient has recently had some issues with her back and was placed on NSAIDs and feels shortly after this she had a lot of bloating and upper abdominal discomfort she describes as a stabbing pain. She says that she has tried prevacid, prilosec, and Nexium in the past for her reflux. She also has celiac disease and has been gluten free since 2005.   She does note that certain types of foods seem to make the issues worse such as foods high in sugar or starch along with red meat.  Patient denies dark tarry stools.  Patient denies diarrhea or constipation.  Maternal grandmother with colon CA  Wt Readings from Last 3 Encounters:  01/02/24 182 lb (82.6 kg)  12/18/22 173 lb (78.5 kg)  04/03/22 161 lb (73 kg)    Past Medical History:  Diagnosis Date   Acute blood loss anemia 10/04/2012   ADD (attention deficit disorder)    Asthma    exercise induced   Celiac disease    COVID-19 long hauler manifesting chronic dyspnea 01/31/2021   COVID-19 long hauler manifesting chronic palpitations 10/23/2020   Has had prolonged symptoms of chronic cough, dyspnea and fatigue.   Dichorionic diamniotic twin pregnancy in third trimester 11/20/2014   GERD  (gastroesophageal reflux disease)    HPV (human papilloma virus) infection    Iron  deficiency anemia 11/27/2014   Migraines    Mitral valve prolapse    OSA (obstructive sleep apnea)    Postpartum care following cesarean delivery of Di-Di twins (8/6) 11/27/2014   Postural orthostatic tachycardia syndrome (POTS)    Preterm uterine contractions in third trimester, antepartum 11/20/2014   Scarlet fever    hx of    Past Surgical History:  Procedure Laterality Date   CARDIOPULMONARY EXERCISE TEST (CPX)  02/13/2021   Normal functional capacity.  No cardiopulmonary abnormalities.  No evidence of any exercise-induced bronchospasm   CESAREAN SECTION N/A 11/27/2014   Procedure: CESAREAN SECTION;  Surgeon: Charlie Flowers, MD;  Location: [facility];  Service: Obstetrics;  Laterality: N/A;   COLONOSCOPY     CRYOABLATION     LAPAROSCOPIC NISSEN FUNDOPLICATION  2004   polyp     removal 2009   Sphenopalatine Ganglion Block  03/28/2022   TRANSTHORACIC ECHOCARDIOGRAM  02/10/2021   EF 60 to 65%.  No R WMA.  GR 1 DD?SABRA  Normal RV size and function.  Normal aortic valve.  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   TYMPANOSTOMY TUBE PLACEMENT     UPPER GASTROINTESTINAL ENDOSCOPY     WISDOM TOOTH EXTRACTION      Current Outpatient Medications  Medication Sig Dispense Refill   botulinum toxin Type A  (  BOTOX ) 200 units injection Provider to inject 155 units into the muscles of the head and neck every 3 months. Discard remainder 1 each 2   Na Sulfate-K Sulfate-Mg Sulfate concentrate (SUPREP) 17.5-3.13-1.6 GM/177ML SOLN Take 1 kit (354 mLs total) by mouth once for 1 dose. 354 mL 0   pantoprazole  (PROTONIX ) 40 MG tablet TAKE 1 TABLET BY MOUTH TWICE A DAY 180 tablet 0   albuterol (VENTOLIN HFA) 108 (90 Base) MCG/ACT inhaler Inhale into the lungs as needed. (Patient not taking: Reported on 01/02/2024)     methocarbamol  (ROBAXIN ) 500 MG tablet Take 1,000 mg by mouth as needed. (Patient not taking:  Reported on 01/02/2024)     metroNIDAZOLE (METROCREAM) 0.75 % cream Apply topically.     mometasone (ELOCON) 0.1 % cream Apply 1 Application topically daily. (Patient not taking: Reported on 01/02/2024)     MYDAYIS 50 MG CP24 Take 1 capsule by mouth every morning. (Patient not taking: Reported on 01/02/2024)     naratriptan  (AMERGE) 2.5 MG tablet Take 1 tablet (2.5 mg total) by mouth as needed for migraine. Take one (1) tablet at onset of headache; if returns or does not resolve, Megin Consalvo repeat after 4 hours; do not exceed five (5) mg in 24 hours. 10 tablet 12   Ubrogepant  (UBRELVY ) 100 MG TABS Take 1 tablet (100 mg total) by mouth as needed (for migraine). Chauncey Bruno repeat a dose in 2 hours if headache persists. Max dose 2 pills in 24 hours (Patient not taking: Reported on 01/02/2024) 16 tablet 11   No current facility-administered medications for this visit.    Allergies as of 01/02/2024 - Review Complete 01/02/2024  Allergen Reaction Noted   Codeine Nausea And Vomiting 01/03/2014   Cottonseed oil Other (See Comments) 09/12/2020   Gluten meal Other (See Comments) 01/05/2014   Nickel Dermatitis, Itching, and Rash 03/23/2021    Family History  Problem Relation Age of Onset   Cancer Mother    CAD Mother    Hypertension Mother    Hypertension Father    CAD Father 69       Followed by Dr. Anner; ~CABG @ age ~33   Cancer Maternal Grandmother    Diabetes Maternal Grandmother    Diabetes type II Maternal Grandmother    Colon cancer Maternal Grandfather    Breast cancer Paternal Grandmother    Stroke Paternal Grandmother    Esophageal cancer Neg Hx    Rectal cancer Neg Hx    Stomach cancer Neg Hx     Review of Systems:    Constitutional: No weight loss, fever, chills, weakness or fatigue HEENT: Eyes: No change in vision               Ears, Nose, Throat:  No change in hearing or congestion Skin: No rash or itching Cardiovascular: No chest pain, chest pressure or palpitations   Respiratory: No  SOB or cough Gastrointestinal: See HPI and otherwise negative Genitourinary: No dysuria or change in urinary frequency Neurological: No headache, dizziness or syncope Musculoskeletal: No new muscle or joint pain Hematologic: No bleeding or bruising Psychiatric: No history of depression or anxiety    Physical Exam:  Vital signs: BP 102/72   Pulse 67   Ht 5' 9 (1.753 m)   Wt 182 lb (82.6 kg)   BMI 26.88 kg/m   Constitutional:   Pleasant  female appears to be in NAD, Well developed, Well nourished, alert and cooperative Throat: Oral cavity and pharynx without inflammation, swelling or lesion.  Respiratory: Respirations even and unlabored. Lungs clear to auscultation bilaterally.   No wheezes, crackles, or rhonchi.  Cardiovascular: Normal S1, S2. Regular rate and rhythm. No peripheral edema, cyanosis or pallor.  Gastrointestinal:  Soft, nondistended, upper abdominal tenderness. No rebound or guarding. Normal bowel sounds. No appreciable masses or hepatomegaly. Rectal:  Not performed.  Msk:  Symmetrical without gross deformities. Without edema, no deformity or joint abnormality.  Neurologic:  Alert and  oriented x4;  grossly normal neurologically.  Skin:   Dry and intact without significant lesions or rashes.  RELEVANT LABS AND IMAGING: CBC    Latest Ref Rng & Units 08/31/2021   10:03 AM 10/26/2020   12:47 PM 11/03/2017    2:05 PM  CBC  WBC 4.0 - 10.5 K/uL 11.8  5.1  7.9   Hemoglobin 12.0 - 15.0 g/dL 87.1  87.3  87.4   Hematocrit 36.0 - 46.0 % 39.6  37.4  37.8   Platelets 150 - 400 K/uL 252  151  202      CMP     Latest Ref Rng & Units 08/31/2021   10:03 AM 10/26/2020   12:47 PM 11/03/2017    2:05 PM  CMP  Glucose 70 - 99 mg/dL 98  85  89   BUN 6 - 20 mg/dL 12  16  14    Creatinine 0.44 - 1.00 mg/dL 9.20  9.31  9.21   Sodium 135 - 145 mmol/L 141  138  137   Potassium 3.5 - 5.1 mmol/L 4.3  3.9  3.6   Chloride 98 - 111 mmol/L 109  106  108   CO2 22 - 32 mmol/L 26  25  23     Calcium  8.9 - 10.3 mg/dL 9.3  8.6  8.8      Lab Results  Component Value Date   TSH 0.658 12/07/2021  10/22 echo- Normal left ventricle size and function with an ejection fraction of 60 to 65%.   06/24/2019 colonoscopy - One 2 mm polyp in the distal rectum, removed with a cold snare. Resected and retrieved. - Diverticulosis in the left colon. - The examination was otherwise normal on direct and retroflexion views.  06/24/2019 EGD - Mild gastritis, biopsied to check for H. pylori. - Intact remote fundoplication. - The examination was otherwise normal.  1. Surgical [P], colon, rectum, polyp - TUBULAR ADENOMA. - NEGATIVE FOR HIGH GRADE DYSPLASIA. 2. Surgical [P], gastric antrum and gastric body - GASTRIC ANTRAL MUCOSA WITH MILD REACTIVE GASTROPATHY. - GASTRIC OXYNTIC MUCOSA WITH MILD CHRONIC GASTRITIS. - WARTHIN-STARRY STAIN IS NEGATIVE FOR HELICOBACTER PYLORI   Assessment: Encounter Diagnoses  Name Primary?   Gastroesophageal reflux disease, unspecified whether esophagitis present Yes   Abdominal pain, epigastric    Dyspepsia    Belching    Bloating    History of colonic polyps    Family history of colon cancer      43 year old female patient with history of GERD and remote fundoplication who presents with uncontrolled GERD despite being on twice daily PPI therapy.  Patient reports dyspeptic like symptoms with upper abdominal pain belching and bloating.  She recently was on treatment management for back pain that included NSAIDs that she has since then discontinued.  Will also check Diatherix H. pylori stool test.  We discussed switching her to different PPI therapy such as Voquenza , she would like to hold off for now and proceed with upper GI endoscopy to rule out gastritis, peptic ulcer disease and/or esophagitis.  Patient is  on a very restricted diet.     Patient also has history of colon polyps, precancerous and family history of colon cancer in maternal grandmother, she would like  to go ahead and pursue colonoscopy for surveillance as well.   Plan: - h pylori diathereix stool test - Sibo breath test -recommend low fodmap diet -continue PPI therapy twice daily -Schedule in [facility] with Dr. San The risks and benefits of EGD with possible biopsies and esophageal dilation were discussed with the patient who agrees to proceed. -Schedule for a colonoscopy in [facility] with Dr. San . The risks and benefits of colonoscopy with possible polypectomy / biopsies were discussed and the patient agrees to proceed.  -patient requesting we have CRNA clear her for [facility] with her cardic history, sent message to Norleen Schillings.  ADDENDUM: cleared by Norleen Schillings CRNA   Thank you for the courtesy of this consult. Please call me with any questions or concerns.   Breken Nazari, FNP-C [facility] 01/02/2024, 10:30 AM  Cc: Jama Chow, MD

## 2024-01-02 NOTE — Patient Instructions (Addendum)
 Small intestinal bacterial overgrowth  Recommend Low fodmap diet   Other options for GERD Dexilant Voquenza   You have been given a testing kit to check for small intestine bacterial overgrowth (SIBO) which is completed by a company named [employer]. Make sure to return your test in the mail using the return mailing label given to you along with the kit. The test order, your demographic and insurance information have all already been sent to the company. [facility] will collect an upfront charge of $109.00 for commercial insurance plans and $229.00 if you are paying cash. The potential remaining total after claim submission and review is $120.00. Make sure to discuss with [facility] PRIOR to having the test to see if they have gotten information from your insurance company as to how much your testing will cost out of pocket, if any. Please contact [facility] at phone number [phone] to get instructions regarding how to perform the test as our office is unable to give specific testing instructions.   Your provider has ordered Diatherix stool testing for you. You have received a kit from our office today containing all necessary supplies to complete this test. Please carefully read the stool collection instructions provided in the kit before opening the accompanying materials. In addition, be sure there is a label providing your full name and date of birth on the puritan opti-swab tube that is supplied in the kit (if you do not see a label with this information on your test tube, please make us  aware before test collection!). After completing the test, you should secure the purtian tube into the specimen biohazard bag. The [facility] E-Req sheet (including date and time of specimen collection) should be placed into the outside pocket of the specimen biohazard bag and returned to the [place] lab (basement floor of [facility]) within 3 days of  collection. Please make sure to give the specimen to a staff member at the lab. DO NOT leave the specimen on the counter.   If the specimen date and time (can be found in the upper right boxed portion of the sheet) are not filled out on the E-Req sheet, the test will NOT be performed.   You have been scheduled for an endoscopy and colonoscopy. Please follow the written instructions given to you at your visit today.  If you use inhalers (even only as needed), please bring them with you on the day of your procedure.  DO NOT TAKE 7 DAYS PRIOR TO TEST- Trulicity (dulaglutide) Ozempic, Wegovy (semaglutide) Mounjaro (tirzepatide) Bydureon Bcise (exanatide extended release)  DO NOT TAKE 1 DAY PRIOR TO YOUR TEST Rybelsus (semaglutide) Adlyxin (lixisenatide) Victoza (liraglutide) Byetta (exanatide) ___________________________________________________________________________ Due to recent changes in healthcare laws, you may see the results of your imaging and laboratory studies on MyChart before your provider has had a chance to review them.  We understand that in some cases there may be results that are confusing or concerning to you. Not all laboratory results come back in the same time frame and the provider may be waiting for multiple results in order to interpret others.  Please give us  48 hours in order for your provider to thoroughly review all the results before contacting the office for clarification of your results.   _______________________________________________________  If your blood pressure at your visit was 140/90 or greater, please contact your primary care physician to follow up on this.  _______________________________________________________  If you are age 43 or older, your body mass index should  be between 23-30. Your Body mass index is 26.88 kg/m. If this is out of the aforementioned range listed, please consider follow up with your Primary Care Provider.  If you are age  41 or younger, your body mass index should be between 19-25. Your Body mass index is 26.88 kg/m. If this is out of the aformentioned range listed, please consider follow up with your Primary Care Provider.   ________________________________________________________  The [facility] providers would like to encourage you to use MYCHART to communicate with providers for non-urgent requests or questions.  Due to long hold times on the telephone, sending your provider a message by [facility] may be a faster and more efficient way to get a response.  Please allow 48 business hours for a response.  Please remember that this is for non-urgent requests.  _______________________________________________________  [facility] is using a team-based approach to care.  Your team is made up of your doctor and two to three APPS. Our APPS (Nurse Practitioners and Physician Assistants) work with your physician to ensure care continuity for you. They are fully qualified to address your health concerns and develop a treatment plan. They communicate directly with your gastroenterologist to care for you. Seeing the Advanced Practice Practitioners on your physician's team can help you by facilitating care more promptly, often allowing for earlier appointments, access to diagnostic testing, procedures, and other specialty referrals.   Thank you for trusting me with your gastrointestinal care. Deanna May, FNP-C

## 2024-01-21 ENCOUNTER — Encounter: Payer: Self-pay | Admitting: Gastroenterology

## 2024-02-11 ENCOUNTER — Encounter: Payer: Self-pay | Admitting: Gastroenterology

## 2024-02-11 ENCOUNTER — Ambulatory Visit (AMBULATORY_SURGERY_CENTER): Admitting: Gastroenterology

## 2024-02-11 VITALS — BP 114/59 | HR 76 | Temp 97.5°F | Resp 14 | Ht 69.0 in | Wt 182.0 lb

## 2024-02-11 DIAGNOSIS — K5730 Diverticulosis of large intestine without perforation or abscess without bleeding: Secondary | ICD-10-CM | POA: Diagnosis not present

## 2024-02-11 DIAGNOSIS — D123 Benign neoplasm of transverse colon: Secondary | ICD-10-CM | POA: Diagnosis not present

## 2024-02-11 DIAGNOSIS — Z1211 Encounter for screening for malignant neoplasm of colon: Secondary | ICD-10-CM | POA: Diagnosis present

## 2024-02-11 DIAGNOSIS — K2970 Gastritis, unspecified, without bleeding: Secondary | ICD-10-CM | POA: Diagnosis not present

## 2024-02-11 DIAGNOSIS — Z800 Family history of malignant neoplasm of digestive organs: Secondary | ICD-10-CM

## 2024-02-11 DIAGNOSIS — R1013 Epigastric pain: Secondary | ICD-10-CM

## 2024-02-11 DIAGNOSIS — Z860100 Personal history of colon polyps, unspecified: Secondary | ICD-10-CM

## 2024-02-11 DIAGNOSIS — K635 Polyp of colon: Secondary | ICD-10-CM | POA: Diagnosis not present

## 2024-02-11 DIAGNOSIS — Z9889 Other specified postprocedural states: Secondary | ICD-10-CM | POA: Diagnosis not present

## 2024-02-11 DIAGNOSIS — K219 Gastro-esophageal reflux disease without esophagitis: Secondary | ICD-10-CM

## 2024-02-11 DIAGNOSIS — R142 Eructation: Secondary | ICD-10-CM

## 2024-02-11 DIAGNOSIS — Z860101 Personal history of adenomatous and serrated colon polyps: Secondary | ICD-10-CM | POA: Diagnosis not present

## 2024-02-11 MED ORDER — SODIUM CHLORIDE 0.9 % IV SOLN: 500.0000 mL | INTRAVENOUS | Status: DC

## 2024-02-11 NOTE — Op Note (Signed)
 [facility] Patient Name: Cheryl Lara Procedure Date: 02/11/2024 2:28 PM MRN: [id number] Endoscopist: Sandor Flatter , MD, [id number] Age: 43 Referring MD:  Date of Birth: 06-26-1980 Gender: Female Account #: [id number] Procedure:                Upper GI endoscopy Indications:              Suspected esophageal reflux, Celiac disease,                            Dyspepsia, Eructation Medicines:                Monitored Anesthesia Care Procedure:                Pre-Anesthesia Assessment:                           - Prior to the procedure, a History and Physical                            was performed, and patient medications and                            allergies were reviewed. The patient's tolerance of                            previous anesthesia was also reviewed. The risks                            and benefits of the procedure and the sedation                            options and risks were discussed with the patient.                            All questions were answered, and informed consent                            was obtained. Prior Anticoagulants: The patient has                            taken no anticoagulant or antiplatelet agents. ASA                            Grade Assessment: II - A patient with mild systemic                            disease. After reviewing the risks and benefits,                            the patient was deemed in satisfactory condition to                            undergo the procedure.  After obtaining informed consent, the endoscope was                            passed under direct vision. Throughout the                            procedure, the patient's blood pressure, pulse, and                            oxygen saturations were monitored continuously. The                            Olympus Scope [id number] was introduced through the                            mouth, and advanced to the second  part of duodenum.                            The upper GI endoscopy was accomplished without                            difficulty. The patient tolerated the procedure                            well. Scope In: Scope Out: Findings:                 The examined esophagus was normal.                           The Z-line was regular and was found 40 cm from the                            incisors.                           Prior Nissen fundoplication was found in the                            cardia. This was largely intact and no significant                            LES laxity noted on anterograde or retroflexed                            views.                           Scattered mild inflammation characterized by                            congestion (edema) and erythema was found in the                            gastric fundus and in the gastric body. Biopsies  were taken with a cold forceps for Helicobacter                            pylori testing. Estimated blood loss was minimal.                           The incisura, gastric antrum and pylorus were                            normal. Biopsies were taken with a cold forceps for                            Helicobacter pylori testing. Estimated blood loss                            was minimal.                           The examined duodenum was normal. Biopsies were                            taken with a cold forceps for histology. Estimated                            blood loss was minimal. Complications:            No immediate complications. Estimated Blood Loss:     Estimated blood loss was minimal. Impression:               - Normal esophagus.                           - Z-line regular, 40 cm from the incisors.                           - Nissen fundoplication was found.                           - Gastritis. Biopsied.                           - Normal incisura, antrum and pylorus. Biopsied.                            - Normal examined duodenum. Biopsied. Recommendation:           - Patient has a contact number available for                            emergencies. The signs and symptoms of potential                            delayed complications were discussed with the                            patient. Return to normal activities tomorrow.  Written discharge instructions were provided to the                            patient.                           - Resume previous diet.                           - Continue present medications.                           - Await pathology results.                           - If continued concern for ongoing reflux symptoms                            despite largely intact fundoplication and high-dose                            PPI therapy, recommend further evaluation with                            esophageal manometry and pH/impedance testing which                            should be done off all acid suppression therapy.                           - Colonoscopy today. Sandor Flatter, MD 02/11/2024 3:07:20 PM

## 2024-02-11 NOTE — Patient Instructions (Addendum)
 Thank you for letting us  care for your healthcare needs today! Please see handouts regarding Gastritis, Polyps, and Diverticulosis. Continue present diet and medications. Await pathology results.  YOU HAD AN ENDOSCOPIC PROCEDURE TODAY AT THE [facility]:   Refer to the procedure report that was given to you for any specific questions about what was found during the examination.  If the procedure report does not answer your questions, please call your gastroenterologist to clarify.  If you requested that your care partner not be given the details of your procedure findings, then the procedure report has been included in a sealed envelope for you to review at your convenience later.  YOU SHOULD EXPECT: Some feelings of bloating in the abdomen. Passage of more gas than usual.  Walking can help get rid of the air that was put into your GI tract during the procedure and reduce the bloating. If you had a lower endoscopy (such as a colonoscopy or flexible sigmoidoscopy) you may notice spotting of blood in your stool or on the toilet paper. If you underwent a bowel prep for your procedure, you may not have a normal bowel movement for a few days.  Please Note:  You might notice some irritation and congestion in your nose or some drainage.  This is from the oxygen used during your procedure.  There is no need for concern and it should clear up in a day or so.  SYMPTOMS TO REPORT IMMEDIATELY:  Following lower endoscopy (colonoscopy or flexible sigmoidoscopy):  Excessive amounts of blood in the stool  Significant tenderness or worsening of abdominal pains  Swelling of the abdomen that is new, acute  Fever of 100F or higher  Following upper endoscopy (EGD)  Vomiting of blood or coffee ground material  New chest pain or pain under the shoulder blades  Painful or persistently difficult swallowing  New shortness of breath  Fever of 100F or higher  Black, tarry-looking stools  For urgent or  emergent issues, a gastroenterologist can be reached at any hour by calling [phone]. Do not use MyChart messaging for urgent concerns.    DIET:  We do recommend a small meal at first, but then you may proceed to your regular diet.  Drink plenty of fluids but you should avoid alcoholic beverages for 24 hours.  ACTIVITY:  You should plan to take it easy for the rest of today and you should NOT DRIVE or use heavy machinery until tomorrow (because of the sedation medicines used during the test).    FOLLOW UP: Our staff will call the number listed on your records the next business day following your procedure.  We will call around 7:15- 8:00 am to check on you and address any questions or concerns that you may have regarding the information given to you following your procedure. If we do not reach you, we will leave a message.     If any biopsies were taken you will be contacted by phone or by letter within the next 1-3 weeks.  Please call us  at [phone] if you have not heard about the biopsies in 3 weeks.    SIGNATURES/CONFIDENTIALITY: You and/or your care partner have signed paperwork which will be entered into your electronic medical record.  These signatures attest to the fact that that the information above on your After Visit Summary has been reviewed and is understood.  Full responsibility of the confidentiality of this discharge information lies with you and/or your care-partner.

## 2024-02-11 NOTE — Progress Notes (Signed)
 A/o x 3, VSS, good SR's, pleased with anesthesia, report to RN

## 2024-02-11 NOTE — Progress Notes (Signed)
 GASTROENTEROLOGY PROCEDURE H&P NOTE   Primary Care Physician: Jama Chow, MD    Reason for Procedure:  GERD, belching, dyspepsia, celiac disease, history of gastritis; colon polyp surveillance, family history of colon cancer  Plan:    EGD, colonoscopy  Patient is appropriate for endoscopic procedure(s) in the ambulatory ([facility]) setting.  The nature of the procedure, as well as the risks, benefits, and alternatives were carefully and thoroughly reviewed with the patient. Ample time for discussion and questions allowed. The patient understood, was satisfied, and agreed to proceed.     HPI: Cheryl Lara is a 43 y.o. female who presents for EGD for evaluation of reflux symptoms, belching, dyspepsia along with colonoscopy for ongoing polyp surveillance.  Patient was most recently seen in the Gastroenterology Clinic on 01/02/2024.  Longstanding history of GERD with prior Nissen fundoplication in 2004.  Last EGD in 06/2019 with non-H. pylori gastritis.  Colonoscopy in 06/2019 with 2 mm rectal adenoma.  No interval change in medical history since that appointment. Please refer to that note for full details regarding GI history and clinical presentation.   Past Medical History:  Diagnosis Date   Acute blood loss anemia 10/04/2012   ADD (attention deficit disorder)    Asthma    exercise induced   Celiac disease    COVID-19 long hauler manifesting chronic dyspnea 01/31/2021   COVID-19 long hauler manifesting chronic palpitations 10/23/2020   Has had prolonged symptoms of chronic cough, dyspnea and fatigue.   Dichorionic diamniotic twin pregnancy in third trimester 11/20/2014   GERD (gastroesophageal reflux disease)    HPV (human papilloma virus) infection    Iron  deficiency anemia 11/27/2014   Migraines    Mitral valve prolapse    OSA (obstructive sleep apnea)    Postpartum care following cesarean delivery of Di-Di twins (8/6) 11/27/2014   Postural orthostatic tachycardia syndrome (POTS)     Preterm uterine contractions in third trimester, antepartum 11/20/2014   Scarlet fever    hx of    Past Surgical History:  Procedure Laterality Date   CARDIOPULMONARY EXERCISE TEST (CPX)  02/13/2021   Normal functional capacity.  No cardiopulmonary abnormalities.  No evidence of any exercise-induced bronchospasm   CESAREAN SECTION N/A 11/27/2014   Procedure: CESAREAN SECTION;  Surgeon: Charlie Flowers, MD;  Location: [facility];  Service: Obstetrics;  Laterality: N/A;   COLONOSCOPY     CRYOABLATION     LAPAROSCOPIC NISSEN FUNDOPLICATION  2004   polyp     removal 2009   Sphenopalatine Ganglion Block  03/28/2022   TRANSTHORACIC ECHOCARDIOGRAM  02/10/2021   EF 60 to 65%.  No R WMA.  GR 1 DD?SABRA  Normal RV size and function.  Normal aortic valve.  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   TYMPANOSTOMY TUBE PLACEMENT     UPPER GASTROINTESTINAL ENDOSCOPY     WISDOM TOOTH EXTRACTION      Prior to Admission medications   Medication Sig Start Date End Date Taking? Authorizing Provider  MYDAYIS 50 MG CP24 Take 1 capsule by mouth every morning. 10/01/20  Yes [provider]  pantoprazole  (PROTONIX ) 40 MG tablet TAKE 1 TABLET BY MOUTH TWICE A DAY 10/18/20  Yes Zehr, Jessica D, PA-C  albuterol (VENTOLIN HFA) 108 (90 Base) MCG/ACT inhaler Inhale into the lungs as needed. Patient not taking: No sig reported 10/26/20   [provider]  botulinum toxin Type A  (BOTOX ) 200 units injection Provider to inject 155 units into the muscles of  the head and neck every 3 months. Discard remainder 12/24/23   Whitfield Raisin, NP  methocarbamol  (ROBAXIN ) 500 MG tablet Take 1,000 mg by mouth as needed. Patient not taking: No sig reported    [provider]  mometasone (ELOCON) 0.1 % cream Apply 1 Application topically daily. Patient not taking: No sig reported    [provider]  Ubrogepant  (UBRELVY ) 100 MG TABS Take 1 tablet (100 mg total) by mouth as needed (for  migraine). May repeat a dose in 2 hours if headache persists. Max dose 2 pills in 24 hours Patient not taking: No sig reported 01/24/23   Whitfield Raisin, NP    Current Outpatient Medications  Medication Sig Dispense Refill   MYDAYIS 50 MG CP24 Take 1 capsule by mouth every morning.     pantoprazole  (PROTONIX ) 40 MG tablet TAKE 1 TABLET BY MOUTH TWICE A DAY 180 tablet 0   albuterol (VENTOLIN HFA) 108 (90 Base) MCG/ACT inhaler Inhale into the lungs as needed. (Patient not taking: No sig reported)     botulinum toxin Type A  (BOTOX ) 200 units injection Provider to inject 155 units into the muscles of the head and neck every 3 months. Discard remainder 1 each 2   methocarbamol  (ROBAXIN ) 500 MG tablet Take 1,000 mg by mouth as needed. (Patient not taking: No sig reported)     mometasone (ELOCON) 0.1 % cream Apply 1 Application topically daily. (Patient not taking: No sig reported)     Ubrogepant  (UBRELVY ) 100 MG TABS Take 1 tablet (100 mg total) by mouth as needed (for migraine). May repeat a dose in 2 hours if headache persists. Max dose 2 pills in 24 hours (Patient not taking: No sig reported) 16 tablet 11   Current Facility-Administered Medications  Medication Dose Route Frequency Provider Last Rate Last Admin   0.9 %  sodium chloride  infusion  500 mL Intravenous Continuous Lillien Petronio V, DO        Allergies as of 02/11/2024 - Review Complete 02/11/2024  Allergen Reaction Noted   Codeine Nausea And Vomiting 01/03/2014   Cottonseed oil Other (See Comments) 09/12/2020   Gluten meal Other (See Comments) 01/05/2014   Nickel Dermatitis, Itching, and Rash 03/23/2021    Family History  Problem Relation Age of Onset   Cancer Mother    CAD Mother    Hypertension Mother    Hypertension Father    CAD Father 10       Followed by Dr. Anner; ~CABG @ age ~54   Cancer Maternal Grandmother    Diabetes Maternal Grandmother    Diabetes type II Maternal Grandmother    Colon cancer Maternal  Grandfather    Breast cancer Paternal Grandmother    Stroke Paternal Grandmother    Esophageal cancer Neg Hx    Rectal cancer Neg Hx    Stomach cancer Neg Hx     Social History   Socioeconomic History   Marital status: Married    Spouse name: Eva   Number of children: 3   Years of education: Not on file   Highest education level: Not on file  Occupational History   Not on file  Tobacco Use   Smoking status: Never   Smokeless tobacco: Never  Vaping Use   Vaping status: Never Used  Substance and Sexual Activity   Alcohol use: Yes    Comment: occ   Drug use: No   Sexual activity: Yes    Birth control/protection: None  Other Topics Concern  Not on file  Social History Narrative   She is married, mother of 3 children ages 70, 22 and 61.   Social Drivers of Corporate investment banker Strain: Not on file  Food Insecurity: Not on file  Transportation Needs: Not on file  Physical Activity: Not on file  Stress: Not on file  Social Connections: Not on file  Intimate Partner Violence: Not on file    Physical Exam: Vital signs in last 24 hours: @BP  124/66   Pulse 70   Temp (!) 97.5 F (36.4 C)   Ht 5' 9 (1.753 m)   Wt 182 lb (82.6 kg)   SpO2 100%   BMI 26.88 kg/m  GEN: NAD EYE: Sclerae anicteric ENT: MMM CV: Non-tachycardic Pulm: CTA b/l GI: Soft, NT/ND NEURO:  Alert & Oriented x 3   Sandor Flatter, DO [facility]   02/11/2024 2:28 PM

## 2024-02-11 NOTE — Progress Notes (Signed)
 Called to room to assist during endoscopic procedure.  Patient ID and intended procedure confirmed with present staff. Received instructions for my participation in the procedure from the performing physician.

## 2024-02-11 NOTE — Op Note (Signed)
 [facility] Patient Name: Cheryl Lara Procedure Date: 02/11/2024 2:19 PM MRN: [id number] Endoscopist: Sandor Flatter , MD, [id number] Age: 43 Referring MD:  Date of Birth: 10-15-1980 Gender: Female Account #: [id number] Procedure:                Colonoscopy Indications:              Surveillance: Personal history of adenomatous                            polyps on last colonoscopy in 2021                           Family history notable for maternal grandmother                            with colon cancer.                           Colonoscopy in 06/2019 with 2 mm rectal adenoma. Medicines:                Monitored Anesthesia Care Procedure:                Pre-Anesthesia Assessment:                           - Prior to the procedure, a History and Physical                            was performed, and patient medications and                            allergies were reviewed. The patient's tolerance of                            previous anesthesia was also reviewed. The risks                            and benefits of the procedure and the sedation                            options and risks were discussed with the patient.                            All questions were answered, and informed consent                            was obtained. Prior Anticoagulants: The patient has                            taken no anticoagulant or antiplatelet agents. ASA                            Grade Assessment: II - A patient with mild systemic  disease. After reviewing the risks and benefits,                            the patient was deemed in satisfactory condition to                            undergo the procedure.                           After obtaining informed consent, the colonoscope                            was passed under direct vision. Throughout the                            procedure, the patient's blood pressure, pulse, and                             oxygen saturations were monitored continuously. The                            Olympus Scope SN [id number] was introduced through the                            anus and advanced to the the terminal ileum. The                            colonoscopy was performed without difficulty. The                            patient tolerated the procedure well. The quality                            of the bowel preparation was good. The terminal                            ileum, ileocecal valve, appendiceal orifice, and                            rectum were photographed. Scope In: 2:48:28 PM Scope Out: 3:01:17 PM Scope Withdrawal Time: 0 hours 9 minutes 14 seconds  Total Procedure Duration: 0 hours 12 minutes 49 seconds  Findings:                 The perianal and digital rectal examinations were                            normal.                           A 4 mm polyp was found in the transverse colon. The                            polyp was sessile. The polyp was removed with a  cold snare. Resection and retrieval were complete.                            Estimated blood loss was minimal.                           A few small-mouthed diverticula were found in the                            transverse colon and ascending colon.                           The retroflexed view of the distal rectum and anal                            verge was normal and showed no anal or rectal                            abnormalities.                           The terminal ileum appeared normal. Complications:            No immediate complications. Estimated Blood Loss:     Estimated blood loss was minimal. Impression:               - One 4 mm polyp in the transverse colon, removed                            with a cold snare. Resected and retrieved.                           - Diverticulosis in the transverse colon and in the                            ascending colon.                            - The distal rectum and anal verge are normal on                            retroflexion view.                           - The examined portion of the ileum was normal. Recommendation:           - Patient has a contact number available for                            emergencies. The signs and symptoms of potential                            delayed complications were discussed with the                            patient.  Return to normal activities tomorrow.                            Written discharge instructions were provided to the                            patient.                           - Resume previous diet.                           - Continue present medications.                           - Await pathology results.                           - Repeat colonoscopy for surveillance based on                            pathology results.                           - Return to GI office PRN. Sandor Flatter, MD 02/11/2024 3:11:00 PM

## 2024-02-12 ENCOUNTER — Telehealth: Payer: Self-pay

## 2024-02-12 NOTE — Telephone Encounter (Signed)
  Follow up Call-     02/11/2024    1:14 PM  Call back number  Post procedure Call Back phone  # [phone]  Permission to leave phone message Yes     Patient questions:  Do you have a fever, pain , or abdominal swelling? No. Pain Score  0 *  Have you tolerated food without any problems? Yes.    Have you been able to return to your normal activities? Yes.    Do you have any questions about your discharge instructions: Diet   No. Medications  No. Follow up visit  No.  Do you have questions or concerns about your Care? No.  Actions: * If pain score is 4 or above: No action needed, pain <4.

## 2024-02-17 LAB — SURGICAL PATHOLOGY

## 2024-02-24 ENCOUNTER — Encounter: Payer: Self-pay | Admitting: Radiology

## 2024-02-25 ENCOUNTER — Ambulatory Visit: Payer: Self-pay | Admitting: Gastroenterology

## 2024-03-11 ENCOUNTER — Other Ambulatory Visit: Payer: Self-pay

## 2024-03-17 ENCOUNTER — Other Ambulatory Visit: Payer: Self-pay

## 2024-03-17 NOTE — Progress Notes (Signed)
 Specialty Pharmacy Refill Coordination Note  Cheryl Lara is a 43 y.o. female assessed today regarding refills of clinic administered specialty medication(s) OnabotulinumtoxinA  (Botox )   Clinic requested Courier to Provider Office   Delivery date: 03/23/24   Verified address: [facility] [address]   Medication will be filled on: 03/20/24

## 2024-03-26 ENCOUNTER — Ambulatory Visit: Admitting: Adult Health

## 2024-03-26 ENCOUNTER — Encounter: Payer: Self-pay | Admitting: Adult Health

## 2024-03-26 VITALS — BP 113/62 | HR 81 | Ht 69.0 in

## 2024-03-26 DIAGNOSIS — G43719 Chronic migraine without aura, intractable, without status migrainosus: Secondary | ICD-10-CM

## 2024-03-26 MED ORDER — ONABOTULINUMTOXINA 200 UNITS IJ SOLR
155.0000 [IU] | Freq: Once | INTRAMUSCULAR | Status: AC
  Administered 2024-03-26: 165 [IU] via INTRAMUSCULAR

## 2024-03-26 NOTE — Progress Notes (Signed)
 Botox - 200 units x 1 vial Lot: [id number] Expiration: 6/27 NDC: 9976-6078-97  Bacteriostatic 0.9% Sodium Chloride - 4 mL  Lot: [id number] Expiration: 07/21/25 NDC: 9590-8033-97  Dx: H56.280 S/P  Witnessed by Rojean LATHER

## 2024-03-26 NOTE — Progress Notes (Signed)
 Update 03/26/2024 JM: Returns for repeat Botox .  Prior injection on 12/30/2023.  Reports continued benefit with botox , about 4 migraines per month which is greater than 50% improvement as previously experiencing every other day.  She she can have wearing off prior to next injection but feels this last injection lasted longer with as significant wearing off effects.  She started doing PT Pilates which has helped with lower back pain and posture.  Continues to do dry needling for cervicalgia.  She continues to clench and grind her teeth which can trigger migraine headache, reports continued benefit with masseter injections. Tolerated procedure well today.  Return in 3 months for repeat injection.        Consent Form Botulism Toxin Injection For Chronic Migraine    Reviewed orally with patient, additionally signature is on file:  Botulism toxin has been approved by the Federal drug administration for treatment of chronic migraine. Botulism toxin does not cure chronic migraine and it may not be effective in some patients.  The administration of botulism toxin is accomplished by injecting a small amount of toxin into the muscles of the neck and head. Dosage must be titrated for each individual. Any benefits resulting from botulism toxin tend to wear off after 3 months with a repeat injection required if benefit is to be maintained. Injections are usually done every 3-4 months with maximum effect peak achieved by about 2 or 3 weeks. Botulism toxin is expensive and you should be sure of what costs you will incur resulting from the injection.  The side effects of botulism toxin use for chronic migraine may include:   -Transient, and usually mild, facial weakness with facial injections  -Transient, and usually mild, head or neck weakness with head/neck injections  -Reduction or loss of forehead facial animation due to forehead muscle weakness  -Eyelid drooping  -Dry eye  -Pain at the site of  injection or bruising at the site of injection  -Double vision  -Potential unknown long term risks   Contraindications: You should not have Botox  if you are pregnant, nursing, allergic to albumin, have an infection, skin condition, or muscle weakness at the site of the injection, or have myasthenia gravis, Lambert-Eaton syndrome, or ALS.  It is also possible that as with any injection, there may be an allergic reaction or no effect from the medication. Reduced effectiveness after repeated injections is sometimes seen and rarely infection at the injection site may occur. All care will be taken to prevent these side effects. If therapy is given over a long time, atrophy and wasting in the muscle injected may occur. Occasionally the patient's become refractory to treatment because they develop antibodies to the toxin. In this event, therapy needs to be modified.  I have read the above information and consent to the administration of botulism toxin.    BOTOX  PROCEDURE NOTE FOR MIGRAINE HEADACHE  Contraindications and precautions discussed with patient(above). Aseptic procedure was observed and patient tolerated procedure. Procedure performed by Harlene Bogaert, AGNP-BC.   The condition has existed for more than 6 months, and pt does not have a diagnosis of ALS, Myasthenia Gravis or Lambert-Eaton Syndrome.  Risks and benefits of injections discussed and pt agrees to proceed with the procedure.  Written consent obtained  These injections are medically necessary. Pt  receives good benefits from these injections. These injections do not cause sedations or hallucinations which the oral therapies may cause.   Description of procedure:  The patient was placed in  a sitting position. The standard protocol was used for Botox  as follows, with 5 units of Botox  injected at each site:  -Procerus muscle, midline injection  -Corrugator muscle, bilateral injection  -Frontalis muscle, bilateral injection, with  2 sites each side, medial injection was performed in the upper one third of the frontalis muscle, in the region vertical from the medial inferior edge of the superior orbital rim. The lateral injection was again in the upper one third of the forehead vertically above the lateral limbus of the cornea, 1.5 cm lateral to the medial injection site.  -Temporalis muscle injection, 4 sites, bilaterally. The first injection was 3 cm above the tragus of the ear, second injection site was 1.5 cm to 3 cm up from the first injection site in line with the tragus of the ear. The third injection site was 1.5-3 cm forward between the first 2 injection sites. The fourth injection site was 1.5 cm posterior to the second injection site. 5th site laterally in the temporalis  muscleat the level of the outer canthus.  -Occipitalis muscle injection, 3 sites, bilaterally. The first injection was done one half way between the occipital protuberance and the tip of the mastoid process behind the ear. The second injection site was done lateral and superior to the first, 1 fingerbreadth from the first injection. The third injection site was 1 fingerbreadth superiorly and medially from the first injection site.  -Cervical paraspinal muscle injection, 2 sites, bilaterally. The first injection site was 1 cm from the midline of the cervical spine, 3 cm inferior to the lower border of the occipital protuberance. The second injection site was 1.5 cm superiorly and laterally to the first injection site.  -Trapezius muscle injection was performed at 3 sites, bilaterally. The first injection site was in the upper trapezius muscle halfway between the inflection point of the neck, and the acromion. The second injection site was one half way between the acromion and the first injection site. The third injection was done between the first injection site and the inflection point of the neck.  -Masseter muscle injection was performed at 1 site,  bilaterally.       A total of 200 units of Botox  was prepared, 165 units of Botox  was injected as documented above, any Botox  not injected was wasted. The patient tolerated the procedure well, there were no complications of the above procedure.   Harlene Bogaert, AGNP-BC  [facility] [address] [place], [place] [address]  Phone [phone] Fax [phone] Note: This document was prepared with digital dictation and possible smart phrase technology. Any transcriptional errors that result from this process are unintentional.

## 2024-05-21 ENCOUNTER — Telehealth: Payer: Self-pay | Admitting: Adult Health

## 2024-05-21 NOTE — Telephone Encounter (Addendum)
 Submitted PA renewal request via CMM, auth was approved.  Auth#: [id number] (05/21/24-05/21/25)

## 2024-06-18 ENCOUNTER — Ambulatory Visit: Admitting: Adult Health

## 2024-06-23 ENCOUNTER — Encounter (HOSPITAL_BASED_OUTPATIENT_CLINIC_OR_DEPARTMENT_OTHER): Admitting: Obstetrics and Gynecology
